# Patient Record
Sex: Female | Born: 1985 | Race: White | Hispanic: No | Smoking: Former smoker
Health system: Southern US, Community
[De-identification: ages and names within clinical notes are randomized; demographics above are authoritative.]

## PROBLEM LIST (undated history)

## (undated) ENCOUNTER — Inpatient Hospital Stay (HOSPITAL_COMMUNITY): Payer: Self-pay

## (undated) DIAGNOSIS — O139 Gestational [pregnancy-induced] hypertension without significant proteinuria, unspecified trimester: Secondary | ICD-10-CM

## (undated) DIAGNOSIS — Q248 Other specified congenital malformations of heart: Secondary | ICD-10-CM

## (undated) DIAGNOSIS — Q349 Congenital malformation of respiratory system, unspecified: Secondary | ICD-10-CM

## (undated) DIAGNOSIS — IMO0002 Reserved for concepts with insufficient information to code with codable children: Secondary | ICD-10-CM

## (undated) DIAGNOSIS — E063 Autoimmune thyroiditis: Secondary | ICD-10-CM

## (undated) DIAGNOSIS — G039 Meningitis, unspecified: Secondary | ICD-10-CM

## (undated) HISTORY — DX: Autoimmune thyroiditis: E06.3

## (undated) HISTORY — PX: WISDOM TOOTH EXTRACTION: SHX21

## (undated) HISTORY — PX: TONSILLECTOMY AND ADENOIDECTOMY: SUR1326

## (undated) HISTORY — DX: Reserved for concepts with insufficient information to code with codable children: IMO0002

## (undated) HISTORY — DX: Congenital malformation of respiratory system, unspecified: Q34.9

## (undated) HISTORY — DX: Meningitis, unspecified: G03.9

---
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
## 2005-01-06 ENCOUNTER — Emergency Department (HOSPITAL_COMMUNITY): Admission: EM | Admit: 2005-01-06 | Discharge: 2005-01-07 | Payer: Self-pay | Admitting: Emergency Medicine

## 2005-05-28 ENCOUNTER — Emergency Department (HOSPITAL_COMMUNITY): Admission: EM | Admit: 2005-05-28 | Discharge: 2005-05-28 | Payer: Self-pay | Admitting: Emergency Medicine

## 2006-02-28 ENCOUNTER — Emergency Department (HOSPITAL_COMMUNITY): Admission: EM | Admit: 2006-02-28 | Discharge: 2006-03-01 | Payer: Self-pay | Admitting: Emergency Medicine

## 2006-09-06 ENCOUNTER — Emergency Department (HOSPITAL_COMMUNITY): Admission: EM | Admit: 2006-09-06 | Discharge: 2006-09-06 | Payer: Self-pay | Admitting: *Deleted

## 2006-11-04 ENCOUNTER — Emergency Department (HOSPITAL_COMMUNITY): Admission: EM | Admit: 2006-11-04 | Discharge: 2006-11-05 | Payer: Self-pay | Admitting: Emergency Medicine

## 2006-11-12 ENCOUNTER — Emergency Department (HOSPITAL_COMMUNITY): Admission: EM | Admit: 2006-11-12 | Discharge: 2006-11-12 | Payer: Self-pay | Admitting: Emergency Medicine

## 2007-05-05 ENCOUNTER — Emergency Department (HOSPITAL_COMMUNITY): Admission: EM | Admit: 2007-05-05 | Discharge: 2007-05-05 | Payer: Self-pay | Admitting: Emergency Medicine

## 2007-07-30 ENCOUNTER — Emergency Department (HOSPITAL_COMMUNITY): Admission: EM | Admit: 2007-07-30 | Discharge: 2007-07-30 | Payer: Self-pay | Admitting: Emergency Medicine

## 2007-11-27 ENCOUNTER — Emergency Department (HOSPITAL_COMMUNITY): Admission: EM | Admit: 2007-11-27 | Discharge: 2007-11-27 | Payer: Self-pay | Admitting: Emergency Medicine

## 2007-12-13 ENCOUNTER — Emergency Department (HOSPITAL_COMMUNITY): Admission: EM | Admit: 2007-12-13 | Discharge: 2007-12-13 | Payer: Self-pay | Admitting: Emergency Medicine

## 2008-01-11 ENCOUNTER — Ambulatory Visit (HOSPITAL_COMMUNITY): Admission: RE | Admit: 2008-01-11 | Discharge: 2008-01-11 | Payer: Self-pay | Admitting: Family Medicine

## 2008-05-22 ENCOUNTER — Emergency Department (HOSPITAL_COMMUNITY): Admission: EM | Admit: 2008-05-22 | Discharge: 2008-05-22 | Payer: Self-pay | Admitting: Emergency Medicine

## 2008-09-28 ENCOUNTER — Emergency Department (HOSPITAL_COMMUNITY): Admission: EM | Admit: 2008-09-28 | Discharge: 2008-09-28 | Payer: Self-pay | Admitting: Emergency Medicine

## 2008-10-04 ENCOUNTER — Emergency Department (HOSPITAL_COMMUNITY): Admission: EM | Admit: 2008-10-04 | Discharge: 2008-10-04 | Payer: Self-pay | Admitting: Emergency Medicine

## 2009-06-13 ENCOUNTER — Inpatient Hospital Stay (HOSPITAL_COMMUNITY): Admission: AD | Admit: 2009-06-13 | Discharge: 2009-06-13 | Payer: Self-pay | Admitting: Obstetrics and Gynecology

## 2009-06-13 ENCOUNTER — Emergency Department (HOSPITAL_COMMUNITY): Admission: EM | Admit: 2009-06-13 | Discharge: 2009-06-13 | Payer: Self-pay | Admitting: Emergency Medicine

## 2009-07-02 DEATH — deceased

## 2010-02-22 ENCOUNTER — Encounter: Payer: Self-pay | Admitting: Emergency Medicine

## 2010-04-09 ENCOUNTER — Emergency Department (HOSPITAL_COMMUNITY)
Admission: EM | Admit: 2010-04-09 | Discharge: 2010-04-09 | Disposition: A | Discharge status: Home / Self Care | Payer: Managed Care, Other (non HMO) | Attending: Emergency Medicine | Admitting: Emergency Medicine

## 2010-04-09 DIAGNOSIS — R059 Cough, unspecified: Secondary | ICD-10-CM | POA: Insufficient documentation

## 2010-04-09 DIAGNOSIS — R05 Cough: Secondary | ICD-10-CM | POA: Insufficient documentation

## 2010-04-09 DIAGNOSIS — J029 Acute pharyngitis, unspecified: Secondary | ICD-10-CM | POA: Insufficient documentation

## 2010-04-09 LAB — RAPID STREP SCREEN (MED CTR MEBANE ONLY): Streptococcus, Group A Screen (Direct): NEGATIVE

## 2010-04-09 LAB — MONONUCLEOSIS SCREEN: Mono Screen: NEGATIVE

## 2010-04-21 LAB — BASIC METABOLIC PANEL
BUN: 5 mg/dL — ABNORMAL LOW (ref 6–23)
CO2: 23 mEq/L (ref 19–32)
Calcium: 9.6 mg/dL (ref 8.4–10.5)
Chloride: 107 mEq/L (ref 96–112)
Creatinine, Ser: 0.54 mg/dL (ref 0.4–1.2)
GFR calc Af Amer: >60 mL/min (ref >60)
GFR calc non Af Amer: >60 mL/min (ref >60)
Glucose, Bld: 99 mg/dL (ref 70–99)
Potassium: 3.6 mEq/L (ref 3.5–5.1)
Sodium: 137 mEq/L (ref 135–145)

## 2010-04-21 LAB — DIFFERENTIAL
Basophils Absolute: 0.1 10*3/uL (ref 0.0–0.1)
Basophils Relative: 1 % (ref 0–1)
Eosinophils Absolute: 0.2 10*3/uL (ref 0.0–0.7)
Eosinophils Relative: 2 % (ref 0–5)
Lymphocytes Relative: 20 % (ref 12–46)
Lymphs Abs: 2.8 10*3/uL (ref 0.7–4.0)
Monocytes Absolute: 1.1 10*3/uL — ABNORMAL HIGH (ref 0.1–1.0)
Monocytes Relative: 8 % (ref 3–12)
Neutro Abs: 9.6 10*3/uL — ABNORMAL HIGH (ref 1.7–7.7)
Neutrophils Relative %: 69 % (ref 43–77)

## 2010-04-21 LAB — CBC
HCT: 32.2 % — ABNORMAL LOW (ref 36.0–46.0)
Hemoglobin: 11.6 g/dL — ABNORMAL LOW (ref 12.0–15.0)
MCHC: 35.9 g/dL (ref 30.0–36.0)
MCV: 88.6 fL (ref 78.0–100.0)
Platelets: 181 10*3/uL (ref 150–400)
RBC: 3.63 MIL/uL — ABNORMAL LOW (ref 3.87–5.11)
RDW: 12.5 % (ref 11.5–15.5)
WBC: 13.9 10*3/uL — ABNORMAL HIGH (ref 4.0–10.5)

## 2010-04-21 LAB — HCG, QUANTITATIVE, PREGNANCY: hCG, Beta Chain, Quant, S: 4533 m[IU]/mL — ABNORMAL HIGH (ref <5)

## 2010-04-21 LAB — ABO/RH: ABO/RH(D): AB POS

## 2010-05-08 LAB — POCT CARDIAC MARKERS
CKMB, poc: <1 ng/mL — ABNORMAL LOW (ref 1.0–8.0)
CKMB, poc: <1 ng/mL — ABNORMAL LOW (ref 1.0–8.0)
Myoglobin, poc: 38.9 ng/mL (ref 12–200)
Myoglobin, poc: 44.6 ng/mL (ref 12–200)
Troponin i, poc: <0.05 ng/mL (ref 0.00–0.09)
Troponin i, poc: <0.05 ng/mL (ref 0.00–0.09)

## 2010-05-08 LAB — BASIC METABOLIC PANEL
BUN: 8 mg/dL (ref 6–23)
CO2: 22 mEq/L (ref 19–32)
Calcium: 8.8 mg/dL (ref 8.4–10.5)
Chloride: 112 mEq/L (ref 96–112)
Creatinine, Ser: 0.66 mg/dL (ref 0.4–1.2)
GFR calc Af Amer: >60 mL/min (ref >60)
GFR calc non Af Amer: >60 mL/min (ref >60)
Glucose, Bld: 104 mg/dL — ABNORMAL HIGH (ref 70–99)
Potassium: 4.1 mEq/L (ref 3.5–5.1)
Sodium: 141 mEq/L (ref 135–145)

## 2010-05-08 LAB — CBC
HCT: 37.6 % (ref 36.0–46.0)
Hemoglobin: 13.2 g/dL (ref 12.0–15.0)
MCHC: 35.1 g/dL (ref 30.0–36.0)
MCV: 90.3 fL (ref 78.0–100.0)
Platelets: 190 10*3/uL (ref 150–400)
RBC: 4.16 MIL/uL (ref 3.87–5.11)
RDW: 13.6 % (ref 11.5–15.5)
WBC: 10.8 10*3/uL — ABNORMAL HIGH (ref 4.0–10.5)

## 2010-05-08 LAB — DIFFERENTIAL
Basophils Absolute: 0.1 10*3/uL (ref 0.0–0.1)
Basophils Relative: 1 % (ref 0–1)
Eosinophils Absolute: 0.2 10*3/uL (ref 0.0–0.7)
Eosinophils Relative: 2 % (ref 0–5)
Lymphocytes Relative: 23 % (ref 12–46)
Lymphs Abs: 2.5 10*3/uL (ref 0.7–4.0)
Monocytes Absolute: 0.8 10*3/uL (ref 0.1–1.0)
Monocytes Relative: 8 % (ref 3–12)
Neutro Abs: 7.1 10*3/uL (ref 1.7–7.7)
Neutrophils Relative %: 66 % (ref 43–77)

## 2010-05-09 LAB — CULTURE, ROUTINE-ABSCESS: Gram Stain: NONE SEEN

## 2010-05-13 LAB — BASIC METABOLIC PANEL WITH GFR
BUN: 10 mg/dL (ref 6–23)
CO2: 27 meq/L (ref 19–32)
Calcium: 9.9 mg/dL (ref 8.4–10.5)
Chloride: 108 meq/L (ref 96–112)
Creatinine, Ser: 0.7 mg/dL (ref 0.4–1.2)
GFR calc non Af Amer: >60 mL/min
Glucose, Bld: 99 mg/dL (ref 70–99)
Potassium: 4.4 meq/L (ref 3.5–5.1)
Sodium: 139 meq/L (ref 135–145)

## 2010-05-13 LAB — POCT CARDIAC MARKERS
CKMB, poc: <1 ng/mL — ABNORMAL LOW (ref 1.0–8.0)
Myoglobin, poc: 50.6 ng/mL (ref 12–200)
Troponin i, poc: <0.05 ng/mL (ref 0.00–0.09)

## 2010-10-27 LAB — CBC
HCT: 35.4 — ABNORMAL LOW
Hemoglobin: 12.4
MCHC: 35.1
MCV: 89
Platelets: 188
RBC: 3.98
RDW: 13.3
WBC: 9.5

## 2010-10-27 LAB — BASIC METABOLIC PANEL
BUN: 11
CO2: 25
Calcium: 9
Chloride: 109
Creatinine, Ser: 0.63
GFR calc Af Amer: >60
GFR calc non Af Amer: >60
Glucose, Bld: 100 — ABNORMAL HIGH
Potassium: 3.9
Sodium: 139

## 2010-10-27 LAB — DIFFERENTIAL
Basophils Absolute: 0.1
Basophils Relative: 1
Eosinophils Absolute: 0.2
Eosinophils Relative: 2
Lymphocytes Relative: 30
Lymphs Abs: 2.8
Monocytes Absolute: 1.2 — ABNORMAL HIGH
Monocytes Relative: 13 — ABNORMAL HIGH
Neutro Abs: 5.2
Neutrophils Relative %: 55

## 2010-10-27 LAB — POCT CARDIAC MARKERS
CKMB, poc: <1 — ABNORMAL LOW
Myoglobin, poc: 25.2
Operator id: 286941
Troponin i, poc: <0.05

## 2010-11-02 LAB — URINALYSIS, ROUTINE W REFLEX MICROSCOPIC
Bilirubin Urine: NEGATIVE
Glucose, UA: NEGATIVE
Hgb urine dipstick: NEGATIVE
Ketones, ur: NEGATIVE
Nitrite: NEGATIVE
Protein, ur: NEGATIVE
Specific Gravity, Urine: 1.02
Urobilinogen, UA: 0.2
pH: 7

## 2010-11-02 LAB — GC/CHLAMYDIA PROBE AMP, GENITAL
Chlamydia, DNA Probe: NEGATIVE
GC Probe Amp, Genital: NEGATIVE

## 2010-11-02 LAB — WET PREP, GENITAL
Clue Cells Wet Prep HPF POC: NONE SEEN
Trich, Wet Prep: NONE SEEN
WBC, Wet Prep HPF POC: NONE SEEN
Yeast Wet Prep HPF POC: NONE SEEN

## 2010-11-02 LAB — RPR: RPR Ser Ql: NONREACTIVE

## 2010-11-02 LAB — PREGNANCY, URINE: Preg Test, Ur: NEGATIVE

## 2010-11-12 LAB — CBC
HCT: 34.8 — ABNORMAL LOW
HCT: 37.9
Hemoglobin: 11.9 — ABNORMAL LOW
Hemoglobin: 12.9
MCHC: 34.1
MCHC: 34.2
MCV: 89.1
MCV: 89.3
Platelets: 196
Platelets: 254
RBC: 3.9
RBC: 4.25
RDW: 13.1
RDW: 13.1
WBC: 10.4
WBC: 12.6 — ABNORMAL HIGH

## 2010-11-12 LAB — URINALYSIS, ROUTINE W REFLEX MICROSCOPIC
Bilirubin Urine: NEGATIVE
Bilirubin Urine: NEGATIVE
Glucose, UA: NEGATIVE
Glucose, UA: NEGATIVE
Ketones, ur: NEGATIVE
Ketones, ur: NEGATIVE
Leukocytes, UA: NEGATIVE
Leukocytes, UA: NEGATIVE
Nitrite: NEGATIVE
Nitrite: NEGATIVE
Protein, ur: NEGATIVE
Protein, ur: NEGATIVE
Specific Gravity, Urine: 1.025
Specific Gravity, Urine: <1.005 — ABNORMAL LOW
Urobilinogen, UA: 0.2
Urobilinogen, UA: 0.2
pH: 6.5
pH: 7

## 2010-11-12 LAB — BASIC METABOLIC PANEL
BUN: 6
CO2: 25
Calcium: 9.3
Chloride: 106
Creatinine, Ser: 0.62
GFR calc Af Amer: >60
GFR calc non Af Amer: >60
Glucose, Bld: 93
Potassium: 4.1
Sodium: 136

## 2010-11-12 LAB — WET PREP, GENITAL
Trich, Wet Prep: NONE SEEN
Yeast Wet Prep HPF POC: NONE SEEN

## 2010-11-12 LAB — DIFFERENTIAL
Basophils Absolute: 0
Basophils Absolute: 0.1
Basophils Relative: 0
Basophils Relative: 1
Eosinophils Absolute: 0.2
Eosinophils Absolute: 0.2
Eosinophils Relative: 2
Eosinophils Relative: 2
Lymphocytes Relative: 25
Lymphocytes Relative: 28
Lymphs Abs: 2.9
Lymphs Abs: 3.1
Monocytes Absolute: 1 — ABNORMAL HIGH
Monocytes Absolute: 1.1 — ABNORMAL HIGH
Monocytes Relative: 10
Monocytes Relative: 9
Neutro Abs: 6.2
Neutro Abs: 8.1 — ABNORMAL HIGH
Neutrophils Relative %: 60
Neutrophils Relative %: 65

## 2010-11-12 LAB — RPR: RPR Ser Ql: NONREACTIVE

## 2010-11-12 LAB — URINE MICROSCOPIC-ADD ON

## 2010-11-12 LAB — GC/CHLAMYDIA PROBE AMP, GENITAL
Chlamydia, DNA Probe: NEGATIVE
GC Probe Amp, Genital: NEGATIVE

## 2010-11-12 LAB — PREGNANCY, URINE
Preg Test, Ur: NEGATIVE
Preg Test, Ur: NEGATIVE

## 2011-06-24 LAB — OB RESULTS CONSOLE HEPATITIS B SURFACE ANTIGEN: Hepatitis B Surface Ag: NEGATIVE

## 2011-06-24 LAB — OB RESULTS CONSOLE RUBELLA ANTIBODY, IGM: Rubella: IMMUNE

## 2011-06-24 LAB — OB RESULTS CONSOLE HIV ANTIBODY (ROUTINE TESTING): HIV: NONREACTIVE

## 2011-06-24 LAB — OB RESULTS CONSOLE RPR: RPR: NONREACTIVE

## 2011-06-24 LAB — OB RESULTS CONSOLE ABO/RH: RH Type: POSITIVE

## 2011-06-24 LAB — OB RESULTS CONSOLE ANTIBODY SCREEN: Antibody Screen: NEGATIVE

## 2011-07-15 ENCOUNTER — Other Ambulatory Visit: Payer: Self-pay | Admitting: Adult Health

## 2011-07-15 ENCOUNTER — Other Ambulatory Visit (HOSPITAL_COMMUNITY)
Admission: RE | Admit: 2011-07-15 | Discharge: 2011-07-15 | Disposition: A | Discharge status: Home / Self Care | Payer: Managed Care, Other (non HMO) | Source: Ambulatory Visit | Attending: Obstetrics and Gynecology | Admitting: Obstetrics and Gynecology

## 2011-07-15 DIAGNOSIS — Z113 Encounter for screening for infections with a predominantly sexual mode of transmission: Secondary | ICD-10-CM | POA: Insufficient documentation

## 2011-07-15 DIAGNOSIS — Z01419 Encounter for gynecological examination (general) (routine) without abnormal findings: Secondary | ICD-10-CM | POA: Insufficient documentation

## 2012-01-24 LAB — OB RESULTS CONSOLE GC/CHLAMYDIA
Chlamydia: NEGATIVE
Gonorrhea: NEGATIVE

## 2012-01-24 LAB — OB RESULTS CONSOLE GBS: GBS: NEGATIVE

## 2012-02-02 ENCOUNTER — Inpatient Hospital Stay (HOSPITAL_COMMUNITY)
Admission: AD | Admit: 2012-02-02 | Discharge: 2012-02-02 | Disposition: A | Discharge status: Home / Self Care | Payer: Managed Care, Other (non HMO) | Source: Ambulatory Visit | Attending: Obstetrics & Gynecology | Admitting: Obstetrics & Gynecology

## 2012-02-02 ENCOUNTER — Encounter (HOSPITAL_COMMUNITY): Payer: Self-pay | Admitting: *Deleted

## 2012-02-02 DIAGNOSIS — R51 Headache: Secondary | ICD-10-CM | POA: Insufficient documentation

## 2012-02-02 DIAGNOSIS — O133 Gestational [pregnancy-induced] hypertension without significant proteinuria, third trimester: Secondary | ICD-10-CM

## 2012-02-02 DIAGNOSIS — O139 Gestational [pregnancy-induced] hypertension without significant proteinuria, unspecified trimester: Secondary | ICD-10-CM

## 2012-02-02 HISTORY — DX: Gestational (pregnancy-induced) hypertension without significant proteinuria, unspecified trimester: O13.9

## 2012-02-02 LAB — URINE MICROSCOPIC-ADD ON

## 2012-02-02 LAB — URINALYSIS, ROUTINE W REFLEX MICROSCOPIC
Bilirubin Urine: NEGATIVE
Glucose, UA: NEGATIVE mg/dL
Hgb urine dipstick: NEGATIVE
Ketones, ur: NEGATIVE mg/dL
Nitrite: NEGATIVE
Protein, ur: NEGATIVE mg/dL
Specific Gravity, Urine: 1.01 (ref 1.005–1.030)
Urobilinogen, UA: 0.2 mg/dL (ref 0.0–1.0)
pH: 6.5 (ref 5.0–8.0)

## 2012-02-02 LAB — PROTEIN / CREATININE RATIO, URINE
Creatinine, Urine: 51.44 mg/dL
Protein Creatinine Ratio: 0.17 — ABNORMAL HIGH (ref 0.00–0.15)
Total Protein, Urine: 8.9 mg/dL

## 2012-02-02 MED ORDER — ACETAMINOPHEN 500 MG PO TABS
1000.0000 mg | ORAL_TABLET | Freq: Once | ORAL | Status: AC
  Administered 2012-02-02: 1000 mg via ORAL
  Filled 2012-02-02: qty 2

## 2012-02-02 MED ORDER — CALCIUM CARBONATE ANTACID 500 MG PO CHEW
400.0000 mg | CHEWABLE_TABLET | Freq: Once | ORAL | Status: AC
  Administered 2012-02-02: 400 mg via ORAL
  Filled 2012-02-02: qty 2

## 2012-02-02 NOTE — L&D Delivery Note (Signed)
Delivery Note  Ms. Joanna [name] is a 27 year old G3P0020 at 40.[redacted] weeks gestation by LMP presented to the labor & delivery unit on 02/13/12 for IOL for GHTN.  Cytotec was used for induction.  She progressed well with Cytotec only.  Her pain has been controlled with an epidural.  AROM at 0150 with light meconium amniotic fluid.  She became complete at 0926 and pushed for 1.5 hours with minimal descent.  She was allowed to rest for 1 hour and resumed pushing with stronger efforts and better descent.    At 12:38 PM a viable female was delivered via Vaginal, Spontaneous Delivery (Presentation: ; Occiput Anterior).  APGAR: 9, 9; weight: pending.   Placenta status: Intact, Spontaneous via Joanna [name].  Cord: 3 vessels with the following complications: None.    Anesthesia: Epidural  Episiotomy: None Lacerations: shallow 2nd degree  Suture Repair: 3.0 vicryl Est. Blood Loss (mL): 200  Mom to postpartum.  Baby skin-to-skin with mom then to nursery prn.  Joanna [name], SNM 02/14/2012, 1:07 PM Supervised by: Wynelle Bourgeois, CNM

## 2012-02-02 NOTE — MAU Note (Signed)
Pt states she was seen in the clinic 02/01/12 and blood pressure was elevated

## 2012-02-02 NOTE — MAU Provider Note (Signed)
Chief Complaint:  Headache and Hypertension   First Provider Initiated Contact with Patient 02/02/12 1550      HPI: Joanna [name] is a 27 y.o. G3P0020 at [redacted]w[redacted]d presents to maternity admissions reporting BP 160'90's at work, frontal HA 3/10, mild relief w/ 325 mg Tylenol, one episode of dizziness at work today, resolved spontaneously. States her BP was elevated and she had protein in her urine yesterday at [facility]. PIH labs draw. Doesn't know results. Denies contractions, leakage of fluid or vaginal bleeding. Good fetal movement.   Pregnancy Course:   Past Medical History: Past Medical History  Diagnosis Date  . Pregnancy induced hypertension     Past obstetric history: OB History    Grav Para Term Preterm Abortions TAB SAB Ect Mult Living   3 0 0 0 2 0 2 0 0 0      # Outc Date GA Lbr Len/2nd Wgt Sex Del Anes PTL Lv   1 SAB 1/08           2 SAB 1/11           3 CUR               Past Surgical History: Past Surgical History  Procedure Date  . Tonsillectomy   . Adnoidectomy     Family History: Family History  Problem Relation Age of Onset  . Hepatitis Mother     Social History: History  Substance Use Topics  . Smoking status: Current Every Day Smoker  . Smokeless tobacco: Not on file  . Alcohol Use: No    Allergies: No Known Allergies  Meds:  Prescriptions prior to admission  Medication Sig Dispense Refill  . acetaminophen (TYLENOL) 325 MG tablet 325 mg this am.      . Prenatal Vit-Fe Fumarate-FA (PRENATAL MULTIVITAMIN) TABS         ROS: Pertinent findings in history of present illness.  Physical Exam  Blood pressure 117/76, pulse 86, temperature 98.8 F (37.1 C), temperature source Oral, resp. rate 18, last menstrual period 05/09/2011. Patient Vitals for the past 24 hrs:  BP Temp Temp src Pulse Resp  02/02/12 1646 117/76 mmHg - - 86  -  02/02/12 1631 127/81 mmHg - - 86  -  02/02/12 1616 117/70 mmHg - - 85  -  02/02/12 1601 129/76 mmHg - - 89  -    02/02/12 1546 132/87 mmHg - - 92  -  02/02/12 1531 138/79 mmHg - - 87  -  02/02/12 1517 134/84 mmHg - - 94  -  02/02/12 1516 131/85 mmHg 98.8 F (37.1 C) Oral 98  18     GENERAL: Well-developed, well-nourished female in no acute distress.  HEENT: normocephalic HEART: normal rate RESP: normal effort ABDOMEN: Soft, non-tender, gravid appropriate for gestational age EXTREMITIES: Nontender, 2+ edema, DTRs 2+, neg homan's NEURO: alert and oriented SPECULUM EXAM: deferred   FHT:  Baseline 130 , moderate variability, accelerations present, no decelerations Contractions: rare, mild   Labs: Results for orders placed during the hospital encounter of 02/02/12 (from the past 24 hour(s))  URINALYSIS, ROUTINE W REFLEX MICROSCOPIC     Status: Abnormal   Collection Time   02/02/12  2:59 PM      Component Value Range   Color, Urine YELLOW  YELLOW   APPearance HAZY (*) CLEAR   Specific Gravity, Urine 1.010  1.005 - 1.030   pH 6.5  5.0 - 8.0   Glucose, UA NEGATIVE  NEGATIVE mg/dL  Hgb urine dipstick NEGATIVE  NEGATIVE   Bilirubin Urine NEGATIVE  NEGATIVE   Ketones, ur NEGATIVE  NEGATIVE mg/dL   Protein, ur NEGATIVE  NEGATIVE mg/dL   Urobilinogen, UA 0.2  0.0 - 1.0 mg/dL   Nitrite NEGATIVE  NEGATIVE   Leukocytes, UA SMALL (*) NEGATIVE  URINE MICROSCOPIC-ADD ON     Status: Abnormal   Collection Time   02/02/12  2:59 PM      Component Value Range   Squamous Epithelial / LPF MANY (*) RARE   WBC, UA 3-6  <3 WBC/hpf   RBC / HPF 0-2  <3 RBC/hpf  PROTEIN / CREATININE RATIO, URINE     Status: Abnormal   Collection Time   02/02/12  2:59 PM      Component Value Range   Creatinine, Urine 51.44     Total Protein, Urine 8.9     PROTEIN CREATININE RATIO 0.17 (*) 0.00 - 0.15    Imaging:  No results found. MAU Course: 1615: ES Tylenol and Tums ordered. Will attempt to obtain [facility] labs results from yesterday. Protein Creatinine ratio ordered.  1800: Labs from yesterday reviewed w/ Dr. Despina Hidden. Normal  except ALT slightly elevated at 45. HA resolved.  Assessment: 1. Gestational hypertension w/o significant proteinuria in 3rd trimester     Plan: Discharge home per consult w/ Dr. Despina Hidden. Labor and PIH precautions and fetal kick counts     Follow-up Information    Follow up with [facility]. On 02/04/2012.   Contact information:   [address] [place] [address] [phone]      Follow up with [facility]ERN[facility] ADMISS[facility]. (As needed if symptoms worsen)    Contact information:   [address] [id number] mc [place] [address] [phone]          Medication List     As of 02/02/2012  9:12 PM    TAKE these medications         acetaminophen 325 MG tablet   Commonly known as: TYLENOL   Take 650 mg by mouth every 6 (six) hours as needed. headache      prenatal multivitamin Tabs   Take 1 tablet by mouth daily.         [place], CNM 02/02/2012 4:07 PM

## 2012-02-02 NOTE — L&D Delivery Note (Signed)
Attended delivery Agree with note Mitchell Epling CNM 

## 2012-02-02 NOTE — Progress Notes (Signed)
Pt state she became dizzy earlier today

## 2012-02-11 ENCOUNTER — Telehealth (HOSPITAL_COMMUNITY): Payer: Self-pay | Admitting: *Deleted

## 2012-02-11 ENCOUNTER — Encounter (HOSPITAL_COMMUNITY): Payer: Self-pay | Admitting: *Deleted

## 2012-02-11 NOTE — H&P (Addendum)
Subjective:  IOL for Gest HTN at 40 .0 weeks  Joanna [name] is a 27 y.o. G3 P0 female with [facility] 02/13/12 at 40 and 0/[redacted] weeks gestation who is being admitted for induction of labor, after being followed at [facility] for Gest HTN for the last 2 weeks of pregnancy with BP's as high as 142/90 with normal Pr/Cr ratio 0.16 on 12/31, and 140/98 with same Pr/Cr on 1/7, with LFT notable 12/31 for AST normal at 27, ALT slight elevation at 45. Recheck 1/7 showed AST 21, and ALT normal at 27.  Office visit 02/11/12 is notable for low normal AF volume, 6.4, BPP 8/8, and Interestingly, the Baby's Gallbladder noted to have what appears to be GB sludge in it. Will mention on PITT form. .  Her current obstetrical history is significant for gest htn.  Patient reports fatigue, no bleeding, no leaking and occasional contractions.   Fetal Movement: normal.    Prenatal Transfer Tool  Maternal Diabetes: No Genetic Screening: Normal Maternal Ultrasounds/Referrals: Abnormal:  Findings:   Other: GALLBLADDER SLUDGE NOTED ON U/S DONE 02/11/12, ALSO NOTABLE FOR EFW 7LB10 OZ, 53%ILE, AND AFI 6.4 CM Fetal Ultrasounds or other Referrals:  None Maternal Substance Abuse:  No Significant Maternal Medications:  None Significant Maternal Lab Results: Lab values include: Group B Strep negative     Objective:   Vital signs in last 24 hours:  BP 02/11/12  138/74 Protein neg on u/a Reflexes 1+  hEIGHT  62.5 IN, WT 212.6 LB  General:   alert, cooperative, no distress and moderately obese  Skin:   normal  HEENT:  PERRLA and extra ocular movement intact  Lungs:   clear to auscultation bilaterally  Heart:   S1, S2 normal  Breasts:     Abdomen:  gravid uterus 37 cm  EFW 7lb 10 oz 1/10  Pelvis:  Cervix: 1/50%/-2 posterior vertex well applied  FHT:  142 BPM  Uterine Size: 37 cm  Presentations: cephalic  Cervix:    Dilation: 1cm   Effacement: 50%   Station:  -2   Consistency: medium   Position: posterior   Lab  Review  AB pos  AFP:NML  One hour GTT: Normal 85/122 on 2hr gtt   Assessment/Plan:  40 and 0/[redacted] weeks gestation for IOL for gest HTN Not in labor. Obstetrical history significant for gest htn..     Risks, benefits, alternatives and possible complications have been discussed in detail with the patient.  Pre-admission, admission, and post admission procedures and expectations were discussed in detail.  All questions answered, all appropriate consents will be signed at the Hospital. Admission is planned for 02/13/12.

## 2012-02-11 NOTE — Telephone Encounter (Signed)
Preadmission screen  

## 2012-02-13 ENCOUNTER — Inpatient Hospital Stay (HOSPITAL_COMMUNITY): Payer: Managed Care, Other (non HMO) | Admitting: Anesthesiology

## 2012-02-13 ENCOUNTER — Inpatient Hospital Stay (HOSPITAL_COMMUNITY)
Admission: RE | Admit: 2012-02-13 | Discharge: 2012-02-16 | DRG: 775 | Disposition: A | Discharge status: Home / Self Care | Payer: Managed Care, Other (non HMO) | Source: Ambulatory Visit | Attending: Obstetrics & Gynecology | Admitting: Obstetrics & Gynecology

## 2012-02-13 ENCOUNTER — Encounter (HOSPITAL_COMMUNITY): Payer: Self-pay | Admitting: Anesthesiology

## 2012-02-13 VITALS — BP 112/76 | HR 71 | Temp 97.6°F | Resp 18 | Ht 62.0 in | Wt 211.0 lb

## 2012-02-13 DIAGNOSIS — O133 Gestational [pregnancy-induced] hypertension without significant proteinuria, third trimester: Secondary | ICD-10-CM

## 2012-02-13 DIAGNOSIS — O139 Gestational [pregnancy-induced] hypertension without significant proteinuria, unspecified trimester: Principal | ICD-10-CM | POA: Diagnosis present

## 2012-02-13 LAB — CBC
HCT: 33.8 % — ABNORMAL LOW (ref 36.0–46.0)
Hemoglobin: 11.8 g/dL — ABNORMAL LOW (ref 12.0–15.0)
MCH: 30.2 pg (ref 26.0–34.0)
MCHC: 34.9 g/dL (ref 30.0–36.0)
MCV: 86.4 fL (ref 78.0–100.0)
Platelets: 201 10*3/uL (ref 150–400)
RBC: 3.91 MIL/uL (ref 3.87–5.11)
RDW: 13.4 % (ref 11.5–15.5)
WBC: 13.4 10*3/uL — ABNORMAL HIGH (ref 4.0–10.5)

## 2012-02-13 LAB — COMPREHENSIVE METABOLIC PANEL
ALT: 31 U/L (ref 0–35)
AST: 20 U/L (ref 0–37)
Albumin: 2.6 g/dL — ABNORMAL LOW (ref 3.5–5.2)
Alkaline Phosphatase: 177 U/L — ABNORMAL HIGH (ref 39–117)
BUN: 7 mg/dL (ref 6–23)
CO2: 19 mEq/L (ref 19–32)
Calcium: 9 mg/dL (ref 8.4–10.5)
Chloride: 105 mEq/L (ref 96–112)
Creatinine, Ser: 0.65 mg/dL (ref 0.50–1.10)
GFR calc Af Amer: >90 mL/min (ref >90)
GFR calc non Af Amer: >90 mL/min (ref >90)
Glucose, Bld: 100 mg/dL — ABNORMAL HIGH (ref 70–99)
Potassium: 4.3 mEq/L (ref 3.5–5.1)
Sodium: 135 mEq/L (ref 135–145)
Total Bilirubin: 0.2 mg/dL — ABNORMAL LOW (ref 0.3–1.2)
Total Protein: 6.5 g/dL (ref 6.0–8.3)

## 2012-02-13 LAB — PROTEIN / CREATININE RATIO, URINE
Creatinine, Urine: 114.1 mg/dL
Protein Creatinine Ratio: 0.18 — ABNORMAL HIGH (ref 0.00–0.15)
Total Protein, Urine: 20.1 mg/dL

## 2012-02-13 LAB — TYPE AND SCREEN
ABO/RH(D): AB POS
Antibody Screen: NEGATIVE

## 2012-02-13 LAB — RPR: RPR Ser Ql: NONREACTIVE

## 2012-02-13 MED ORDER — OXYCODONE-ACETAMINOPHEN 5-325 MG PO TABS: 1.0000 | ORAL_TABLET | ORAL | Status: DC | PRN

## 2012-02-13 MED ORDER — MISOPROSTOL 25 MCG QUARTER TABLET
25.0000 ug | ORAL_TABLET | ORAL | Status: DC | PRN
  Administered 2012-02-13 (×2): 25 ug via VAGINAL
  Filled 2012-02-13 (×2): qty 0.25

## 2012-02-13 MED ORDER — FENTANYL 2.5 MCG/ML BUPIVACAINE 1/10 % EPIDURAL INFUSION (WH - ANES)
14.0000 mL/h | INTRAMUSCULAR | Status: DC
  Administered 2012-02-14: 14 mL/h via EPIDURAL
  Filled 2012-02-13 (×2): qty 125

## 2012-02-13 MED ORDER — PHENYLEPHRINE 40 MCG/ML (10ML) SYRINGE FOR IV PUSH (FOR BLOOD PRESSURE SUPPORT)
80.0000 ug | PREFILLED_SYRINGE | INTRAVENOUS | Status: DC | PRN
  Filled 2012-02-13: qty 5

## 2012-02-13 MED ORDER — FENTANYL CITRATE 0.05 MG/ML IJ SOLN: 100.0000 ug | INTRAMUSCULAR | Status: DC | PRN

## 2012-02-13 MED ORDER — OXYTOCIN BOLUS FROM INFUSION
500.0000 mL | INTRAVENOUS | Status: DC
  Administered 2012-02-14: 500 mL via INTRAVENOUS

## 2012-02-13 MED ORDER — FLEET ENEMA 7-19 GM/118ML RE ENEM: 1.0000 | ENEMA | RECTAL | Status: DC | PRN

## 2012-02-13 MED ORDER — IBUPROFEN 600 MG PO TABS: 600.0000 mg | ORAL_TABLET | Freq: Four times a day (QID) | ORAL | Status: DC | PRN

## 2012-02-13 MED ORDER — DIPHENHYDRAMINE HCL 50 MG/ML IJ SOLN: 12.5000 mg | INTRAMUSCULAR | Status: DC | PRN

## 2012-02-13 MED ORDER — ONDANSETRON HCL 4 MG/2ML IJ SOLN
4.0000 mg | Freq: Four times a day (QID) | INTRAMUSCULAR | Status: DC | PRN
  Administered 2012-02-14 (×2): 4 mg via INTRAVENOUS
  Filled 2012-02-13 (×2): qty 2

## 2012-02-13 MED ORDER — OXYTOCIN 40 UNITS IN LACTATED RINGERS INFUSION - SIMPLE MED
62.5000 mL/h | INTRAVENOUS | Status: DC
  Administered 2012-02-14: 62.5 mL/h via INTRAVENOUS
  Filled 2012-02-13: qty 1000

## 2012-02-13 MED ORDER — LACTATED RINGERS IV SOLN
INTRAVENOUS | Status: DC
  Administered 2012-02-14: 11:00:00 via INTRAVENOUS

## 2012-02-13 MED ORDER — TERBUTALINE SULFATE 1 MG/ML IJ SOLN: 0.2500 mg | Freq: Once | INTRAMUSCULAR | Status: AC | PRN

## 2012-02-13 MED ORDER — LACTATED RINGERS IV SOLN: 500.0000 mL | Freq: Once | INTRAVENOUS | Status: DC

## 2012-02-13 MED ORDER — EPHEDRINE 5 MG/ML INJ: 10.0000 mg | INTRAVENOUS | Status: DC | PRN

## 2012-02-13 MED ORDER — LIDOCAINE HCL (PF) 1 % IJ SOLN
INTRAMUSCULAR | Status: DC | PRN
  Administered 2012-02-13 (×2): 4 mL

## 2012-02-13 MED ORDER — LACTATED RINGERS IV SOLN: 500.0000 mL | INTRAVENOUS | Status: DC | PRN

## 2012-02-13 MED ORDER — ACETAMINOPHEN 325 MG PO TABS: 650.0000 mg | ORAL_TABLET | ORAL | Status: DC | PRN

## 2012-02-13 MED ORDER — FENTANYL CITRATE 0.05 MG/ML IJ SOLN
100.0000 ug | INTRAMUSCULAR | Status: DC | PRN
  Administered 2012-02-13 (×2): 100 ug via INTRAVENOUS
  Filled 2012-02-13 (×2): qty 2

## 2012-02-13 MED ORDER — CITRIC ACID-SODIUM CITRATE 334-500 MG/5ML PO SOLN: 30.0000 mL | ORAL | Status: DC | PRN

## 2012-02-13 MED ORDER — FENTANYL 2.5 MCG/ML BUPIVACAINE 1/10 % EPIDURAL INFUSION (WH - ANES)
INTRAMUSCULAR | Status: DC | PRN
  Administered 2012-02-13: 12 mL/h via EPIDURAL

## 2012-02-13 MED ORDER — PHENYLEPHRINE 40 MCG/ML (10ML) SYRINGE FOR IV PUSH (FOR BLOOD PRESSURE SUPPORT): 80.0000 ug | PREFILLED_SYRINGE | INTRAVENOUS | Status: DC | PRN

## 2012-02-13 MED ORDER — LIDOCAINE HCL (PF) 1 % IJ SOLN
30.0000 mL | INTRAMUSCULAR | Status: DC | PRN
  Filled 2012-02-13: qty 30

## 2012-02-13 MED ORDER — EPHEDRINE 5 MG/ML INJ
10.0000 mg | INTRAVENOUS | Status: DC | PRN
  Filled 2012-02-13: qty 4

## 2012-02-13 NOTE — Progress Notes (Addendum)
Patient ID: [name], female   DOB: [birth year], 27 y.o.   MRN: [id number]  S:  Pt here for induction of labor. Feeling ctx q 10 minutes. No leak of fluid or bleeding. Baby moving.  O: Filed Vitals:   02/13/12 0754 02/13/12 0800  BP: 135/99   Pulse: 101   Resp: 20   Height:  5\' 2"  (1.575 m)  Weight:  95.709 kg (211 lb)   Cervix:  2/60/-2 very posterior  FHTs:  150, mod var, accels present, no decels Vertex by ultrasound  A/P 27 y.o. G3P0020 at [redacted]w[redacted]d with GHTN - IOL with cytotec (bishop's score 5), then pitocin - GBS negative - BP are 130s/high 90s. Will get labs and protein/creat ratio (was 0.17 on 1/1). - Epidural when desired  Napoleon Form, MD 02/13/2012 9:32 AM

## 2012-02-13 NOTE — Anesthesia Procedure Notes (Signed)
Epidural Patient location during procedure: OB Start time: 02/13/2012 9:42 PM  Staffing Anesthesiologist: Tumeka Chimenti A. Performed by: anesthesiologist   Preanesthetic Checklist Completed: patient identified, site marked, surgical consent, pre-op evaluation, timeout performed, IV checked, risks and benefits discussed and monitors and equipment checked  Epidural Patient position: sitting Prep: site prepped and draped and DuraPrep Patient monitoring: continuous pulse ox and blood pressure Approach: midline Injection technique: LOR air  Needle:  Needle type: Tuohy  Needle gauge: 17 G Needle length: 9 cm and 9 Needle insertion depth: 5 cm cm Catheter type: closed end flexible Catheter size: 19 Gauge Catheter at skin depth: 10 cm Test dose: negative and Other  Assessment Events: blood not aspirated, injection not painful, no injection resistance, negative IV test and no paresthesia  Additional Notes Patient identified. Risks and benefits discussed including failed block, incomplete  Pain control, post dural puncture headache, nerve damage, paralysis, blood pressure Changes, nausea, vomiting, reactions to medications-both toxic and allergic and post Partum back pain. All questions were answered. Patient expressed understanding and wished to proceed. Sterile technique was used throughout procedure. Epidural site was Dressed with sterile barrier dressing. No paresthesias, signs of intravascular injection Or signs of intrathecal spread were encountered.  Patient was more comfortable after the epidural was dosed. Please see RN's note for documentation of vital signs and FHR which are stable.

## 2012-02-13 NOTE — Progress Notes (Signed)
Patient ID: Joanna [name], female   DOB: 03/15/1985, 27 y.o.   MRN: [id number]   S:  Pt mildly uncomfortable. Some ctx she is having to breathe through.  O:   Filed Vitals:   02/13/12 0800 02/13/12 0956 02/13/12 1111 02/13/12 1320  BP:  138/61 136/85 135/86  Pulse:  88 84 82  Temp:    98.2 F (36.8 C)  TempSrc:    Oral  Resp:  18 18 18   Height: 5\' 2"  (1.575 m)     Weight: 95.709 kg (211 lb)       Results for orders placed during the hospital encounter of 02/13/12 (from the past 24 hour(s))  CBC     Status: Abnormal   Collection Time   02/13/12  9:44 AM      Component Value Range   WBC 13.4 (*) 4.0 - 10.5 K/uL   RBC 3.91  3.87 - 5.11 MIL/uL   Hemoglobin 11.8 (*) 12.0 - 15.0 g/dL   HCT 40.9 (*) 81.1 - 91.4 %   MCV 86.4  78.0 - 100.0 fL   MCH 30.2  26.0 - 34.0 pg   MCHC 34.9  30.0 - 36.0 g/dL   RDW 78.2  95.6 - 21.3 %   Platelets 201  150 - 400 K/uL  COMPREHENSIVE METABOLIC PANEL     Status: Abnormal   Collection Time   02/13/12  9:44 AM      Component Value Range   Sodium 135  135 - 145 mEq/L   Potassium 4.3  3.5 - 5.1 mEq/L   Chloride 105  96 - 112 mEq/L   CO2 19  19 - 32 mEq/L   Glucose, Bld 100 (*) 70 - 99 mg/dL   BUN 7  6 - 23 mg/dL   Creatinine, Ser 0.86  0.50 - 1.10 mg/dL   Calcium 9.0  8.4 - 57.8 mg/dL   Total Protein 6.5  6.0 - 8.3 g/dL   Albumin 2.6 (*) 3.5 - 5.2 g/dL   AST 20  0 - 37 U/L   ALT 31  0 - 35 U/L   Alkaline Phosphatase 177 (*) 39 - 117 U/L   Total Bilirubin 0.2 (*) 0.3 - 1.2 mg/dL   GFR calc non Af Amer >90  >90 mL/min   GFR calc Af Amer >90  >90 mL/min  TYPE AND SCREEN     Status: Normal   Collection Time   02/13/12  9:44 AM      Component Value Range   ABO/RH(D) AB POS     Antibody Screen NEG     Sample Expiration 02/16/2012      CERVIX:  2/60/-2, very posterior.  FHTs: 130, mod var, accels present, no decels TOCO:  q 1-5 min  A/P IOL for GHTN. - BP controlled - Urine protein:creatinine pending, CMP and CBC nml - Redose  cytotec, too posterior for FB.  Joaquim Lai 02/13/2012 2:21 PM

## 2012-02-13 NOTE — Progress Notes (Signed)
Subjective: Pt doing well, starting to have more ctx.  No leakage of fluid to this point  Objective: BP 144/86  Pulse 82  Temp 98.1 F (36.7 C) (Oral)  Resp 20  Ht 5\' 2"  (1.575 m)  Wt 95.709 kg (211 lb)  BMI 38.59 kg/m2  LMP 05/09/2011      FHT:  FHR: 140 bpm, variability: moderate,  accelerations:  Present,  decelerations:  Absent UC:   regular, every 1-3 minutes SVE:   Dilation: 2.5 Effacement (%): 80 Station: -1 Exam by:: heather cnm  Labs: Lab Results  Component Value Date   WBC 13.4* 02/13/2012   HGB 11.8* 02/13/2012   HCT 33.8* 02/13/2012   MCV 86.4 02/13/2012   PLT 201 02/13/2012    Assessment / Plan: Induction of labor due to gestational hypertension,  progressing well on pitocin  Labor: Progressing normally Preeclampsia:  Protein/Creatinine ratio pending Fetal Wellbeing:  Category I Pain Control:  Epidural I/D:  n/a Anticipated MOD:  NSVD  Joanna [name] 02/13/2012, 8:47 PM

## 2012-02-13 NOTE — Anesthesia Preprocedure Evaluation (Signed)
Anesthesia Evaluation  Patient identified by MRN, date of birth, ID band Patient awake    Reviewed: Allergy & Precautions, H&P , Patient's Chart, lab work & pertinent test results  Airway Mallampati: III TM Distance: >3 FB Neck ROM: full    Dental No notable dental hx. (+) Teeth Intact   Pulmonary neg pulmonary ROS,  breath sounds clear to auscultation  Pulmonary exam normal       Cardiovascular hypertension, Rhythm:regular Rate:Normal  PIH    Neuro/Psych negative neurological ROS  negative psych ROS   GI/Hepatic negative GI ROS, Neg liver ROS,   Endo/Other  negative endocrine ROS  Renal/GU negative Renal ROS  negative genitourinary   Musculoskeletal   Abdominal Normal abdominal exam  (+)   Peds  Hematology negative hematology ROS (+)   Anesthesia Other Findings   Reproductive/Obstetrics (+) Pregnancy                           Anesthesia Physical Anesthesia Plan  ASA: III  Anesthesia Plan: Epidural   Post-op Pain Management:    Induction:   Airway Management Planned:   Additional Equipment:   Intra-op Plan:   Post-operative Plan:   Informed Consent: I have reviewed the patients History and Physical, chart, labs and discussed the procedure including the risks, benefits and alternatives for the proposed anesthesia with the patient or authorized representative who has indicated his/her understanding and acceptance.     Plan Discussed with: Anesthesiologist  Anesthesia Plan Comments:         Anesthesia Quick Evaluation

## 2012-02-14 ENCOUNTER — Encounter (HOSPITAL_COMMUNITY): Payer: Self-pay

## 2012-02-14 DIAGNOSIS — O139 Gestational [pregnancy-induced] hypertension without significant proteinuria, unspecified trimester: Secondary | ICD-10-CM

## 2012-02-14 MED ORDER — ZOLPIDEM TARTRATE 5 MG PO TABS: 5.0000 mg | ORAL_TABLET | Freq: Every evening | ORAL | Status: DC | PRN

## 2012-02-14 MED ORDER — CALCIUM CARBONATE ANTACID 500 MG PO CHEW
2.0000 | CHEWABLE_TABLET | ORAL | Status: DC | PRN
  Administered 2012-02-14 (×2): 400 mg via ORAL
  Filled 2012-02-14 (×2): qty 2

## 2012-02-14 MED ORDER — ONDANSETRON HCL 4 MG/2ML IJ SOLN: 4.0000 mg | INTRAMUSCULAR | Status: DC | PRN

## 2012-02-14 MED ORDER — TETANUS-DIPHTH-ACELL PERTUSSIS 5-2.5-18.5 LF-MCG/0.5 IM SUSP
0.5000 mL | Freq: Once | INTRAMUSCULAR | Status: AC
  Administered 2012-02-15: 0.5 mL via INTRAMUSCULAR
  Filled 2012-02-14: qty 0.5

## 2012-02-14 MED ORDER — ACETAMINOPHEN 325 MG PO TABS: 650.0000 mg | ORAL_TABLET | Freq: Four times a day (QID) | ORAL | Status: DC | PRN

## 2012-02-14 MED ORDER — PRENATAL MULTIVITAMIN CH: 1.0000 | ORAL_TABLET | Freq: Every day | ORAL | Status: DC

## 2012-02-14 MED ORDER — DIBUCAINE 1 % RE OINT: 1.0000 "application " | TOPICAL_OINTMENT | RECTAL | Status: DC | PRN

## 2012-02-14 MED ORDER — WITCH HAZEL-GLYCERIN EX PADS: 1.0000 "application " | MEDICATED_PAD | CUTANEOUS | Status: DC | PRN

## 2012-02-14 MED ORDER — IBUPROFEN 600 MG PO TABS
600.0000 mg | ORAL_TABLET | Freq: Four times a day (QID) | ORAL | Status: DC
  Administered 2012-02-14 – 2012-02-16 (×8): 600 mg via ORAL
  Filled 2012-02-14 (×8): qty 1

## 2012-02-14 MED ORDER — SIMETHICONE 80 MG PO CHEW: 80.0000 mg | CHEWABLE_TABLET | ORAL | Status: DC | PRN

## 2012-02-14 MED ORDER — ONDANSETRON HCL 4 MG PO TABS: 4.0000 mg | ORAL_TABLET | ORAL | Status: DC | PRN

## 2012-02-14 MED ORDER — DIPHENHYDRAMINE HCL 25 MG PO CAPS: 25.0000 mg | ORAL_CAPSULE | Freq: Four times a day (QID) | ORAL | Status: DC | PRN

## 2012-02-14 MED ORDER — PRENATAL MULTIVITAMIN CH
1.0000 | ORAL_TABLET | Freq: Every day | ORAL | Status: DC
  Administered 2012-02-15: 1 via ORAL
  Filled 2012-02-14: qty 1

## 2012-02-14 MED ORDER — OXYCODONE-ACETAMINOPHEN 5-325 MG PO TABS
1.0000 | ORAL_TABLET | ORAL | Status: DC | PRN
  Administered 2012-02-14 (×2): 1 via ORAL
  Administered 2012-02-15 (×2): 2 via ORAL
  Administered 2012-02-15 – 2012-02-16 (×3): 1 via ORAL
  Filled 2012-02-14 (×3): qty 1
  Filled 2012-02-14: qty 2
  Filled 2012-02-14 (×2): qty 1
  Filled 2012-02-14: qty 2

## 2012-02-14 MED ORDER — BENZOCAINE-MENTHOL 20-0.5 % EX AERO
1.0000 "application " | INHALATION_SPRAY | CUTANEOUS | Status: DC | PRN
  Filled 2012-02-14: qty 56

## 2012-02-14 MED ORDER — MEASLES, MUMPS & RUBELLA VAC ~~LOC~~ INJ
0.5000 mL | INJECTION | Freq: Once | SUBCUTANEOUS | Status: DC
  Filled 2012-02-14: qty 0.5

## 2012-02-14 MED ORDER — LANOLIN HYDROUS EX OINT: TOPICAL_OINTMENT | CUTANEOUS | Status: DC | PRN

## 2012-02-14 MED ORDER — SENNOSIDES-DOCUSATE SODIUM 8.6-50 MG PO TABS
2.0000 | ORAL_TABLET | Freq: Every day | ORAL | Status: DC
  Administered 2012-02-14 – 2012-02-15 (×2): 2 via ORAL

## 2012-02-14 NOTE — Progress Notes (Signed)
Subjective: Pt doing well, received epidural.  Having contractions   Objective: BP 118/73  Pulse 68  Temp 98.1 F (36.7 C) (Oral)  Resp 18  Ht 5\' 2"  (1.575 m)  Wt 95.709 kg (211 lb)  BMI 38.59 kg/m2  SpO2 97%  LMP 05/09/2011      FHT:  FHR: 140 bpm, variability: moderate,  accelerations:  Present,  decelerations:  Absent UC:   regular, every 1-3 minutes SVE:   Dilation: 3 Effacement (%): 80 Station: -1 Exam by:: a. white rn  Labs: Lab Results  Component Value Date   WBC 13.4* 02/13/2012   HGB 11.8* 02/13/2012   HCT 33.8* 02/13/2012   MCV 86.4 02/13/2012   PLT 201 02/13/2012    Assessment / Plan: Induction of labor due to gestational hypertension  Labor: Progressing normally Preeclampsia:  Protein/Creatinine ratio <0.3 Fetal Wellbeing:  Category I Pain Control:  Epidural I/D:  n/a Anticipated MOD:  NSVD  Joanna [name] 02/14/2012, 12:04 AM

## 2012-02-14 NOTE — Progress Notes (Signed)
S: Doing well, pain well-controlled with an epidural.  (+) pelvic pressure felt with every contraction.  Pushing x 1 hour.  Good fetal movement.   OCeasar Mons Vitals:   02/14/12 0901 02/14/12 0931 02/14/12 1002 02/14/12 1031  BP: 135/83 152/91 130/71 130/69  Pulse: 99 104 96 89  Temp:  98.4 F (36.9 C)    TempSrc:  Oral    Resp: 18 18 18 18   Height:      Weight:      SpO2:         FHT:  FHR: 155 bpm, variability: moderate,  accelerations:  Absent,  decelerations:  Absent, variables with pushing UC:   irregular, every 2-5 minutes SVE:   Dilation: 10 Effacement (%): 100 Station: +2;+3 Exam by:: Carloyn Jaeger, SNM   A / P: Induction of labor due to gestational hypertension Small fetal descent with pushing efforts  Fetal Wellbeing:  Category I Pain Control:  Epidural  Continue with pushing efforts for 30 more minutes Patient to rest x 1 hour, if minimal fetal descent continues Anticipated MOD:  NSVD  Raelyn Mora, SNM 02/14/2012, 10:35 AM Supervised by: Wynelle Bourgeois, CNM

## 2012-02-14 NOTE — Progress Notes (Signed)
Subjective: Pt doing well.  Having contractions with minimal pain at this point.   Objective: BP 129/68  Pulse 66  Temp 98.2 F (36.8 C) (Oral)  Resp 18  Ht 5\' 2"  (1.575 m)  Wt 95.709 kg (211 lb)  BMI 38.59 kg/m2  SpO2 97%  LMP 05/09/2011      FHT:  FHR: 140 bpm, variability: moderate,  accelerations:  Present,  decelerations:  Absent UC:   regular, every 1-3 minutes SVE:   Dilation: 4 Effacement (%): 90 Station: 0 Exam by:: heather cnm  Labs: Lab Results  Component Value Date   WBC 13.4* 02/13/2012   HGB 11.8* 02/13/2012   HCT 33.8* 02/13/2012   MCV 86.4 02/13/2012   PLT 201 02/13/2012    Assessment / Plan: Induction of labor due to gestational hypertension  Labor: Progressing normally, AROM  Preeclampsia:  Protein/Creatinine ratio <0.3 Fetal Wellbeing:  Category I Pain Control:  Epidural I/D:  n/a Anticipated MOD:  NSVD  Gildardo Cranker 02/14/2012, 1:56 AM

## 2012-02-14 NOTE — Progress Notes (Signed)
Subjective: Pt doing well.  AROM around one hour ago  Objective: BP 126/73  Pulse 93  Temp 98.4 F (36.9 C) (Oral)  Resp 18  Ht 5\' 2"  (1.575 m)  Wt 95.709 kg (211 lb)  BMI 38.59 kg/m2  SpO2 97%  LMP 05/09/2011      FHT:  FHR: 140 bpm, variability: moderate,  accelerations:  Present,  decelerations:  Absent UC:   regular, every 1-3 minutes SVE:   Dilation: 7 Effacement (%): 90 Station: 0 Exam by:: heather cnm  Labs: Lab Results  Component Value Date   WBC 13.4* 02/13/2012   HGB 11.8* 02/13/2012   HCT 33.8* 02/13/2012   MCV 86.4 02/13/2012   PLT 201 02/13/2012    Assessment / Plan: Induction of labor due to gestational hypertension  Labor: Progressing normally, AROM, will consider Pitocin  Preeclampsia:  Protein/Creatinine ratio <0.3 Fetal Wellbeing:  Category I Pain Control:  Epidural I/D:  n/a Anticipated MOD:  NSVD  Gildardo Cranker 02/14/2012, 3:36 AM

## 2012-02-14 NOTE — Anesthesia Postprocedure Evaluation (Signed)
  Anesthesia Post-op Note  Patient: Joanna [name]  Procedure(s) Performed: * No procedures listed *  Patient Location: Mother/Baby  Anesthesia Type:Epidural  Level of Consciousness: awake, alert  and oriented  Airway and Oxygen Therapy: Patient Spontanous Breathing  Post-op Pain: none  Post-op Assessment: Post-op Vital signs reviewed, Patient's Cardiovascular Status Stable, No headache, No backache, No residual numbness and No residual motor weakness  Post-op Vital Signs: Reviewed and stable  Complications: No apparent anesthesia complications

## 2012-02-14 NOTE — Progress Notes (Signed)
Seen and examined  Agree with note  Wynelle Bourgeois CNM

## 2012-02-14 NOTE — Progress Notes (Signed)
Joanna [name] is a 27 y.o. G3P0020 at [redacted]w[redacted]d by ultrasound admitted for induction of labor due to Hypertension.  Subjective:   Objective: BP 152/91  Pulse 104  Temp 98.4 F (36.9 C) (Oral)  Resp 18  Ht 5\' 2"  (1.575 m)  Wt 211 lb (95.709 kg)  BMI 38.59 kg/m2  SpO2 97%  LMP 05/09/2011 I/O last 3 completed shifts: In: 1300 [Other:1300] Out: -     FHT:  FHR: 140 bpm, variability: moderate,  accelerations:  Present,  decelerations:  Absent UC:   regular, every 2 minutes SVE:   Dilation: 10 Effacement (%): 100 Station: +2;+3 Exam by:: Carloyn Jaeger, SNM  Labs: Lab Results  Component Value Date   WBC 13.4* 02/13/2012   HGB 11.8* 02/13/2012   HCT 33.8* 02/13/2012   MCV 86.4 02/13/2012   PLT 201 02/13/2012    Assessment / Plan: Induction of labor due to gestational hypertension,  progressing well on pitocin  Labor: Progressing as expected Preeclampsia:  labs stable Fetal Wellbeing:  Category I Pain Control:  Epidural but has window of pain in right hip I/D:  n/a Anticipated MOD:  NSVD  Will start pushing.   [facility] 02/14/2012, 9:55 AM

## 2012-02-15 LAB — CBC
HCT: 27.9 % — ABNORMAL LOW (ref 36.0–46.0)
Hemoglobin: 9.6 g/dL — ABNORMAL LOW (ref 12.0–15.0)
MCH: 29.7 pg (ref 26.0–34.0)
MCHC: 34.4 g/dL (ref 30.0–36.0)
MCV: 86.4 fL (ref 78.0–100.0)
Platelets: 152 10*3/uL (ref 150–400)
RBC: 3.23 MIL/uL — ABNORMAL LOW (ref 3.87–5.11)
RDW: 13.5 % (ref 11.5–15.5)
WBC: 23.9 10*3/uL — ABNORMAL HIGH (ref 4.0–10.5)

## 2012-02-15 NOTE — Progress Notes (Signed)
Ur chart review completed per request.  

## 2012-02-15 NOTE — Discharge Summary (Signed)
Obstetric Discharge Summary Reason for Admission: induction of labor Prenatal Procedures: Preeclampsia Intrapartum Procedures: spontaneous vaginal delivery Postpartum Procedures: none Complications-Operative and Postpartum: 2nd degree perineal laceration Hemoglobin  Date Value Range Status  02/15/2012 9.6* 12.0 - 15.0 g/dL Final     REPEATED TO VERIFY     DELTA CHECK NOTED     HCT  Date Value Range Status  02/15/2012 27.9* 36.0 - 46.0 % Final    Physical Exam:  General: alert, cooperative, fatigued and no distress Lochia: appropriate Uterine Fundus: firm Incision: healing well DVT Evaluation: No evidence of DVT seen on physical exam.  Discharge Diagnoses: Term Pregnancy-delivered  Discharge Information: Date: 02/15/2012 Activity: pelvic rest Diet: routine Medications: PNV, Ibuprofen, Colace and Percocet Condition: stable Instructions: refer to practice specific booklet Discharge to: home Follow-up Information    Schedule an appointment as soon as possible for a visit with [facility].         Newborn Data: Live born female  Birth Weight: 8 lb 0.8 oz (3651 g) APGAR: 9, 9  Home with mother.  [place], Bradley 02/15/2012, 7:35 AM  I have seen the patient with the student and agree with the above.

## 2012-02-16 MED ORDER — IBUPROFEN 600 MG PO TABS: 600.0000 mg | ORAL_TABLET | Freq: Four times a day (QID) | ORAL | Status: DC

## 2012-02-16 NOTE — Progress Notes (Signed)
CSW met with pt briefly to assess her current social situation, after RN observed pt's anxious behavior.  This is pt's first baby.  She lives with FOB, Temon Herbin, who is very attentive to the baby and appropriate.  Pt admits to feeling nervous when the infant cries.  The infant has gas, which is causing some iritiablity and pt states she is learning how to burp her better.  Pt's mother plans to come stay with the couple for a couple of days to help pt and FOB, since this is their first baby.  Pt denies any history of depression or SI.  CSW discussed PP depression symptoms with the pt and provided her with literature to read.  They have all the necessary supplies for the infant and are ready to discharge home.  CSW available to assist further if needed.

## 2012-02-16 NOTE — Discharge Summary (Signed)
Attestation of Attending Supervision of Advanced Practitioner (PA/CNM/NP): Evaluation and management procedures were performed by the Advanced Practitioner under my supervision and collaboration.  I have reviewed the Advanced Practitioner's note and chart, and I agree with the management and plan.  Fahmida Jurich, MD, FACOG Attending Obstetrician & Gynecologist Faculty Practice, [facility]  

## 2012-02-16 NOTE — Discharge Summary (Signed)
Obstetric Discharge Summary Reason for Admission: induction of labor Prenatal Procedures: none Intrapartum Procedures: spontaneous vaginal delivery Postpartum Procedures: none Complications-Operative and Postpartum: 2nd degree perineal laceration Hemoglobin  Date Value Range Status  02/15/2012 9.6* 12.0 - 15.0 g/dL Final     REPEATED TO VERIFY     DELTA CHECK NOTED     HCT  Date Value Range Status  02/15/2012 27.9* 36.0 - 46.0 % Final    Physical Exam:  General: alert, cooperative and no distress Lochia: appropriate Uterine Fundus: firm Incision: healing well DVT Evaluation: No evidence of DVT seen on physical exam.  Discharge Diagnoses: Term Pregnancy-delivered  Discharge Information: Date: 02/16/2012 Activity: pelvic rest Diet: routine Medications: PNV, Ibuprofen and Colace Condition: stable Instructions: refer to practice specific booklet Discharge to: home Follow-up Information    Follow up with [facility]. Schedule an appointment as soon as possible for a visit in 6 days. (pt already has appointment made)          Newborn Data: Live born female  Birth Weight: 8 lb 0.8 oz (3651 g) APGAR: 9, 9  Home with mother.  Henn[place], Bradley 02/16/2012, 7:45 AM

## 2012-02-17 NOTE — H&P (Signed)
  UPDATED HISTORY AND PHYSICAL Please see related pre-admission H&P from Dr. Emelda Fear dated  S: Joanna [name] is a 27 y.o. [id number] presenting at [redacted]w[redacted]d gestation for induction of labor for gestational hypertension. She has had normal protein:creatinine ratios in clinic with BP in the 140s/90s for the last two weeks of pregnancy. Feeling ctx q 10 minutes. No leak of fluid or bleeding. Baby moving.   Past Medical History  Diagnosis Date  . Pregnancy induced hypertension   . 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 Normal delivery 02/14/2012    Past Surgical History  Procedure Date  . Tonsillectomy   . Adnoidectomy    History   Social History  . Marital Status: Single    Spouse Name: N/A    Number of Children: N/A  . Years of Education: N/A   Occupational History  . Not on file.   Social History Main Topics  . Smoking status: Current Every Day Smoker -- 0.2 packs/day  . Smokeless tobacco: Never Used  . Alcohol Use: No  . Drug Use: No  . Sexually Active: Yes   Other Topics Concern  . Not on file   Social History Narrative  . No narrative on file   No current facility-administered medications on file prior to encounter.   Current Outpatient Prescriptions on File Prior to Encounter  Medication Sig Dispense Refill  . acetaminophen (TYLENOL) 325 MG tablet Take 650 mg by mouth every 6 (six) hours as needed. headache      . Prenatal Vit-Fe Fumarate-FA (PRENATAL MULTIVITAMIN) TABS Take 1 tablet by mouth daily.       Allergies  Allergen Reactions  . Latex Rash    rash    ROS: No headache, vision changes, RUQ abdominal pain, fever, chills, nausea or vomiting.   O:  Filed Vitals:    02/13/12 0754  02/13/12 0800   BP:  135/99    Pulse:  101    Resp:  20    Height:   5\' 2"  (1.575 m)   Weight:   95.709 kg (211 lb)    PHYSICAL  General:  alert, cooperative, no distress and moderately obese   Skin:  normal   HEENT:  NCAT, extra ocular movement intact   Lungs:  clear  to auscultation bilaterally   Heart:  S1, S2 normal      Abdomen:  gravid uterus 37 cm EFW 7lb 10 oz 1/10     Cervix: 2/60/-2 very posterior  FHTs: 150, mod var, accels present, no decels  Vertex by ultrasound    A/P  27 y.o. G3P0020 at [redacted]w[redacted]d with GHTN  - IOL with cytotec (bishop's score 5), then pitocin  - GBS negative  - BP are 130s/high 90s. Will get labs and protein/creat ratio (was 0.17 on 1/1).  - Epidural when desired   Napoleon Form, MD  02/13/2012 9:32 AM

## 2012-04-27 ENCOUNTER — Encounter: Payer: Self-pay | Admitting: Adult Health

## 2012-04-27 ENCOUNTER — Encounter: Payer: Self-pay | Admitting: *Deleted

## 2012-11-01 ENCOUNTER — Emergency Department (HOSPITAL_COMMUNITY)
Admission: EM | Admit: 2012-11-01 | Discharge: 2012-11-02 | Disposition: A | Discharge status: Home / Self Care | Payer: Managed Care, Other (non HMO) | Attending: Emergency Medicine | Admitting: Emergency Medicine

## 2012-11-01 ENCOUNTER — Encounter (HOSPITAL_COMMUNITY): Payer: Self-pay | Admitting: *Deleted

## 2012-11-01 DIAGNOSIS — Y939 Activity, unspecified: Secondary | ICD-10-CM | POA: Insufficient documentation

## 2012-11-01 DIAGNOSIS — Z791 Long term (current) use of non-steroidal anti-inflammatories (NSAID): Secondary | ICD-10-CM | POA: Insufficient documentation

## 2012-11-01 DIAGNOSIS — Z8669 Personal history of other diseases of the nervous system and sense organs: Secondary | ICD-10-CM | POA: Insufficient documentation

## 2012-11-01 DIAGNOSIS — S335XXA Sprain of ligaments of lumbar spine, initial encounter: Secondary | ICD-10-CM | POA: Insufficient documentation

## 2012-11-01 DIAGNOSIS — X58XXXA Exposure to other specified factors, initial encounter: Secondary | ICD-10-CM | POA: Insufficient documentation

## 2012-11-01 DIAGNOSIS — Z880 Allergy status to penicillin: Secondary | ICD-10-CM | POA: Insufficient documentation

## 2012-11-01 DIAGNOSIS — Y929 Unspecified place or not applicable: Secondary | ICD-10-CM | POA: Insufficient documentation

## 2012-11-01 DIAGNOSIS — R109 Unspecified abdominal pain: Secondary | ICD-10-CM | POA: Insufficient documentation

## 2012-11-01 DIAGNOSIS — S39012A Strain of muscle, fascia and tendon of lower back, initial encounter: Secondary | ICD-10-CM

## 2012-11-01 DIAGNOSIS — Z91040 Latex allergy status: Secondary | ICD-10-CM | POA: Insufficient documentation

## 2012-11-01 DIAGNOSIS — F17200 Nicotine dependence, unspecified, uncomplicated: Secondary | ICD-10-CM | POA: Insufficient documentation

## 2012-11-01 DIAGNOSIS — Z3202 Encounter for pregnancy test, result negative: Secondary | ICD-10-CM | POA: Insufficient documentation

## 2012-11-01 DIAGNOSIS — Z79899 Other long term (current) drug therapy: Secondary | ICD-10-CM | POA: Insufficient documentation

## 2012-11-01 NOTE — ED Notes (Signed)
Pt c/o lower back pain that radiates around both sides into her abdomen. Pt also states her urine has a foul odor. Denies dysuria.

## 2012-11-02 ENCOUNTER — Emergency Department (HOSPITAL_COMMUNITY): Payer: Managed Care, Other (non HMO)

## 2012-11-02 LAB — URINALYSIS, ROUTINE W REFLEX MICROSCOPIC
Bilirubin Urine: NEGATIVE
Glucose, UA: NEGATIVE mg/dL
Hgb urine dipstick: NEGATIVE
Ketones, ur: NEGATIVE mg/dL
Leukocytes, UA: NEGATIVE
Nitrite: NEGATIVE
Protein, ur: NEGATIVE mg/dL
Specific Gravity, Urine: <1.005 — ABNORMAL LOW (ref 1.005–1.030)
Urobilinogen, UA: 0.2 mg/dL (ref 0.0–1.0)
pH: 6.5 (ref 5.0–8.0)

## 2012-11-02 LAB — PREGNANCY, URINE: Preg Test, Ur: NEGATIVE

## 2012-11-02 MED ORDER — CYCLOBENZAPRINE HCL 10 MG PO TABS
10.0000 mg | ORAL_TABLET | Freq: Once | ORAL | Status: AC
  Administered 2012-11-02: 10 mg via ORAL
  Filled 2012-11-02: qty 1

## 2012-11-02 MED ORDER — IBUPROFEN 400 MG PO TABS
400.0000 mg | ORAL_TABLET | Freq: Once | ORAL | Status: AC
  Administered 2012-11-02: 400 mg via ORAL
  Filled 2012-11-02: qty 1

## 2012-11-02 MED ORDER — CYCLOBENZAPRINE HCL 5 MG PO TABS: 5.0000 mg | ORAL_TABLET | Freq: Three times a day (TID) | ORAL | Status: DC | PRN

## 2012-11-02 MED ORDER — IBUPROFEN 600 MG PO TABS: 600.0000 mg | ORAL_TABLET | Freq: Four times a day (QID) | ORAL | Status: DC | PRN

## 2012-11-04 NOTE — ED Provider Notes (Signed)
CSN: [id number]     Arrival date & time 11/01/12  2252 History   First MD Initiated Contact with Patient 11/02/12 0008     Chief Complaint  Patient presents with  . Back Pain  . Abdominal Pain   (Consider location/radiation/quality/duration/timing/severity/associated sxs/prior Treatment) HPI Comments: Joanna [name] is a 27 y.o. Female presenting with midline lower back pain radiating into her bilateral lower flank which has been present for the past several days.  She denies any injury and does not have a history of back problems, but pain is worse with palpation and movement.  She also notes that her urine has smelled stronger than normal.  She denies fevers, chills, hematuria, dysuria and has had no abdominal pain or vaginal discharge.    There is no radiation of pain into her legs.  There has been no weakness or numbness in the lower extremities and no urinary or bowel retention or incontinence.  Patient does not have a history of cancer or IVDU. She has taken tylenol without relief of pain.      Patient is a 27 y.o. female presenting with abdominal pain. The history is provided by the patient.  Abdominal Pain Associated symptoms: no chest pain, no constipation, no diarrhea, no dysuria, no fever, no nausea, no shortness of breath, no vaginal discharge and no vomiting     Past Medical History  Diagnosis Date  . 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 Normal delivery 02/14/2012   Past Surgical History  Procedure Laterality Date  . Tonsillectomy    . Adnoidectomy     Family History  Problem Relation Age of Onset  . Hepatitis Mother   . Diabetes Mother     borderline  . Heart disease Mother   . Depression Mother   . Hyperlipidemia Father   . AAA (abdominal aortic aneurysm) Paternal Grandfather    History  Substance Use Topics  . Smoking status: Current Every Day Smoker -- 0.25 packs/day  . Smokeless tobacco: Never Used  . Alcohol Use: No   OB History   Grav Para Term  Preterm Abortions TAB SAB Ect Mult Living   3 1 1  0 2 0 2 0 0 1     Review of Systems  Constitutional: Negative for fever.  Respiratory: Negative for shortness of breath.   Cardiovascular: Negative for chest pain and leg swelling.  Gastrointestinal: Negative for nausea, vomiting, abdominal pain, diarrhea, constipation and abdominal distention.  Genitourinary: Negative for dysuria, urgency, frequency, flank pain, vaginal discharge and difficulty urinating.  Musculoskeletal: Positive for back pain. Negative for joint swelling and gait problem.  Skin: Negative for rash.  Neurological: Negative for weakness and numbness.    Allergies  Amoxicillin and Latex  Home Medications   Current Outpatient Rx  Name  Route  Sig  Dispense  Refill  . acetaminophen (TYLENOL) 325 MG tablet   Oral   Take 650 mg by mouth every 6 (six) hours as needed. headache         . cyclobenzaprine (FLEXERIL) 5 MG tablet   Oral   Take 1 tablet (5 mg total) by mouth 3 (three) times daily as needed for muscle spasms.   15 tablet   0   . ibuprofen (ADVIL,MOTRIN) 600 MG tablet   Oral   Take 1 tablet (600 mg total) by mouth every 6 (six) hours.   50 tablet   1   . ibuprofen (ADVIL,MOTRIN) 600 MG tablet   Oral  Take 1 tablet (600 mg total) by mouth every 6 (six) hours as needed for pain.   20 tablet   0   . Prenatal Vit-Fe Fumarate-FA (PRENATAL MULTIVITAMIN) TABS   Oral   Take 1 tablet by mouth daily.          BP 135/77  Pulse 83  Temp(Src) 98.3 F (36.8 C) (Oral)  Resp 24  Ht 5\' 2"  (1.575 m)  Wt 190 lb 12.8 oz (86.546 kg)  BMI 34.89 kg/m2  SpO2 100%  LMP 10/21/2012 Physical Exam  Nursing note and vitals reviewed. Constitutional: She appears well-developed and well-nourished.  HENT:  Head: Normocephalic.  Eyes: Conjunctivae are normal.  Neck: Normal range of motion. Neck supple.  Cardiovascular: Normal rate and intact distal pulses.   Pedal pulses normal.  Pulmonary/Chest: Effort  normal.  Abdominal: Soft. Bowel sounds are normal. She exhibits no distension and no mass.  Musculoskeletal: Normal range of motion. She exhibits tenderness. She exhibits no edema.       Lumbar back: She exhibits tenderness. She exhibits no swelling, no edema and no spasm.  Lumbar and paralumbar ttp.  No deformity , no rash noted.  Neurological: She is alert. She has normal strength. She displays no atrophy and no tremor. No sensory deficit. Gait normal.  Reflex Scores:      Patellar reflexes are 2+ on the right side and 2+ on the left side.      Achilles reflexes are 2+ on the right side and 2+ on the left side. No strength deficit noted in hip and knee flexor and extensor muscle groups.  Ankle flexion and extension intact.  Skin: Skin is warm and dry.  Psychiatric: She has a normal mood and affect.    ED Course  Procedures (including critical care time) Labs Review Labs Reviewed  URINALYSIS, ROUTINE W REFLEX MICROSCOPIC - Abnormal; Notable for the following:    Specific Gravity, Urine <1.005 (*)    All other components within normal limits  PREGNANCY, URINE   Imaging Review No results found.  MDM   1. Lumbar strain, initial encounter    Patients labs and/or radiological studies were viewed and considered during the medical decision making and disposition process. No urinary infection, no hematuria,  Exam reproduces pain with palpation.  Suspect musculoskeletal source.  She was prescribed ibuprofen,  Flexeril,  Encouraged heat tx,  Rest.  Recheck by pcp if sx not improved over the next 5 days.    Burgess Amor, PA-C 11/04/12 1236

## 2012-11-04 NOTE — ED Provider Notes (Signed)
Medical screening examination/treatment/procedure(s) were performed by non-physician practitioner and as supervising physician I was immediately available for consultation/collaboration.   Benny Lennert, MD 11/04/12 [phone]

## 2013-12-03 ENCOUNTER — Encounter (HOSPITAL_COMMUNITY): Payer: Self-pay | Admitting: *Deleted

## 2014-07-23 ENCOUNTER — Emergency Department (HOSPITAL_COMMUNITY)
Admission: EM | Admit: 2014-07-23 | Discharge: 2014-07-23 | Disposition: A | Discharge status: Home / Self Care | Payer: Managed Care, Other (non HMO)

## 2014-11-22 ENCOUNTER — Encounter (HOSPITAL_COMMUNITY): Payer: Self-pay | Admitting: *Deleted

## 2014-11-22 ENCOUNTER — Emergency Department (HOSPITAL_COMMUNITY)
Admission: EM | Admit: 2014-11-22 | Discharge: 2014-11-22 | Disposition: A | Discharge status: Home / Self Care | Payer: Managed Care, Other (non HMO) | Attending: Emergency Medicine | Admitting: Emergency Medicine

## 2014-11-22 DIAGNOSIS — K047 Periapical abscess without sinus: Secondary | ICD-10-CM | POA: Insufficient documentation

## 2014-11-22 DIAGNOSIS — Z8661 Personal history of infections of the central nervous system: Secondary | ICD-10-CM | POA: Diagnosis not present

## 2014-11-22 DIAGNOSIS — Z880 Allergy status to penicillin: Secondary | ICD-10-CM | POA: Insufficient documentation

## 2014-11-22 DIAGNOSIS — Z79899 Other long term (current) drug therapy: Secondary | ICD-10-CM | POA: Diagnosis not present

## 2014-11-22 DIAGNOSIS — K0889 Other specified disorders of teeth and supporting structures: Secondary | ICD-10-CM | POA: Diagnosis present

## 2014-11-22 DIAGNOSIS — K0381 Cracked tooth: Secondary | ICD-10-CM | POA: Diagnosis not present

## 2014-11-22 DIAGNOSIS — Z91040 Latex allergy status: Secondary | ICD-10-CM | POA: Insufficient documentation

## 2014-11-22 DIAGNOSIS — Z720 Tobacco use: Secondary | ICD-10-CM | POA: Diagnosis not present

## 2014-11-22 MED ORDER — CLINDAMYCIN HCL 150 MG PO CAPS
300.0000 mg | ORAL_CAPSULE | Freq: Once | ORAL | Status: AC
  Administered 2014-11-22: 300 mg via ORAL
  Filled 2014-11-22: qty 2

## 2014-11-22 MED ORDER — CLINDAMYCIN HCL 150 MG PO CAPS: 300.0000 mg | ORAL_CAPSULE | Freq: Three times a day (TID) | ORAL | Status: DC

## 2014-11-22 MED ORDER — HYDROCODONE-ACETAMINOPHEN 5-325 MG PO TABS: 2.0000 | ORAL_TABLET | ORAL | Status: DC | PRN

## 2014-11-22 NOTE — ED Provider Notes (Signed)
CSN: [id number]     Arrival date & time 11/22/14  0003 History   First MD Initiated Contact with Patient 11/22/14 0009     Chief Complaint  Patient presents with  . Dental Pain     (Consider location/radiation/quality/duration/timing/severity/associated sxs/prior Treatment) Patient is a 29 y.o. female presenting with tooth pain. The history is provided by the patient.  Dental Pain Location:  Upper Upper teeth location:  1/RU 3rd molar Quality:  Throbbing Severity:  Severe Onset quality:  Gradual Duration:  1 day Timing:  Constant Progression:  Worsening Chronicity:  New Context: dental fracture   Relieved by:  Nothing Worsened by:  Cold food/drink  [place] is a 29 y.o. female who presents to the ED with pain to the right upper jaw that radiates to the right ear. She states her wisdom tooth has broken off and the pain started yesterday afternoon. She has an appointment with the oral surgeon next week but when she saw the swelling on her gum and had increased pain she came in for antibiotics and pain medication.   Past Medical History  Diagnosis Date  . Meningitis spinal     29 years old  . Normal delivery 02/14/2012   Past Surgical History  Procedure Laterality Date  . Tonsillectomy    . Adnoidectomy     Family History  Problem Relation Age of Onset  . Hepatitis Mother   . Diabetes Mother     borderline  . Heart disease Mother   . Depression Mother   . Hyperlipidemia Father   . AAA (abdominal aortic aneurysm) Paternal Grandfather    Social History  Substance Use Topics  . Smoking status: Current Every Day Smoker -- 0.25 packs/day  . Smokeless tobacco: Never Used  . Alcohol Use: No   OB History    Gravida Para Term Preterm AB TAB SAB Ectopic Multiple Living   0 2 0 2 0 0 1     Review of Systems  HENT: Positive for dental problem.   all other systems negative    Allergies  Amoxicillin and Latex  Home Medications   Prior to Admission  medications   Medication Sig Start Date End Date Taking? Authorizing Provider  acetaminophen (TYLENOL) 325 MG tablet Take 650 mg by mouth every 6 (six) hours as needed. headache    Historical Provider, MD  clindamycin (CLEOCIN) 150 MG capsule Take 2 capsules (300 mg total) by mouth 3 (three) times daily. 11/22/14   Shalisha Clausing Orlene Och, NP  cyclobenzaprine (FLEXERIL) 5 MG tablet Take 1 tablet (5 mg total) by mouth 3 (three) times daily as needed for muscle spasms. 11/02/12   Burgess Amor, PA-C  HYDROcodone-acetaminophen (NORCO/VICODIN) 5-325 MG tablet Take 2 tablets by mouth every 4 (four) hours as needed. 11/22/14   Almer Littleton Orlene Och, NP  ibuprofen (ADVIL,MOTRIN) 600 MG tablet Take 1 tablet (600 mg total) by mouth every 6 (six) hours. 02/16/12   Arabella Merles, CNM  ibuprofen (ADVIL,MOTRIN) 600 MG tablet Take 1 tablet (600 mg total) by mouth every 6 (six) hours as needed for pain. 11/02/12   Burgess Amor, PA-C  Prenatal Vit-Fe Fumarate-FA (PRENATAL MULTIVITAMIN) TABS Take 1 tablet by mouth daily.    Historical Provider, MD   BP 131/87 mmHg  Pulse 103  Temp(Src) 98.3 F (36.8 C) (Oral)  Resp 20  Ht  (1.575 m)  Wt 201 lb (91.173 kg)  BMI 36.75 kg/m2  SpO2 100%  LMP 11/08/2014 Physical Exam  Constitutional: She is oriented to person, place, and time. She appears well-developed and well-nourished.  HENT:  Head: Normocephalic and atraumatic.  Nose: Nose normal.  Mouth/Throat: Uvula is midline, oropharynx is clear and moist and mucous membranes are normal.    The right upper third molar is broken and there is an abscess noted to the gum above the tooth. There is tenderness on exam  Eyes: EOM are normal.  Neck: Neck supple.  Cardiovascular: Normal rate and regular rhythm.   Pulmonary/Chest: Effort normal and breath sounds normal. No respiratory distress. She has no wheezes.  Musculoskeletal: Normal range of motion.  Neurological: She is alert and oriented to person, place, and time. No cranial nerve  deficit.  Skin: Skin is warm and dry.  Psychiatric: She has a normal mood and affect. Her behavior is normal.  Nursing note and vitals reviewed.   ED Course  Procedures   MDM  29 y.o. female with broken tooth and abscess stable for d/c without fever and does not appear toxic. Will treat with Clindamycin and hydrocodone. She will continue to take ibuprofen and keep her follow up appointment with the oral surgeon next week.   Final diagnoses:  Dental abscess       Janne NapoleonHope M Shaden Higley, NP 11/22/14 0115  Dione Boozeavid Glick, MD 11/22/14 0600

## 2014-11-22 NOTE — ED Notes (Signed)
Patient was given prepackage of hydrocodone-acetaminophen quantity six and instructions on use.

## 2014-11-22 NOTE — ED Notes (Signed)
Pt c/o pain to right upper jaw with the pain radiating to ear and side of head; pt states her wisdom tooth has broken off; pt started yesterday evening around 5pm

## 2014-11-22 NOTE — Discharge Instructions (Signed)

## 2014-12-09 MED FILL — Hydrocodone-Acetaminophen Tab 5-325 MG: ORAL | Qty: 6 | Status: AC

## 2015-01-17 ENCOUNTER — Emergency Department (HOSPITAL_COMMUNITY)
Admission: EM | Admit: 2015-01-17 | Discharge: 2015-01-17 | Disposition: A | Discharge status: Home / Self Care | Payer: Managed Care, Other (non HMO) | Attending: Emergency Medicine | Admitting: Emergency Medicine

## 2015-01-17 ENCOUNTER — Encounter (HOSPITAL_COMMUNITY): Payer: Self-pay | Admitting: *Deleted

## 2015-01-17 DIAGNOSIS — Z880 Allergy status to penicillin: Secondary | ICD-10-CM | POA: Insufficient documentation

## 2015-01-17 DIAGNOSIS — K0889 Other specified disorders of teeth and supporting structures: Secondary | ICD-10-CM | POA: Diagnosis not present

## 2015-01-17 DIAGNOSIS — R42 Dizziness and giddiness: Secondary | ICD-10-CM

## 2015-01-17 DIAGNOSIS — Z792 Long term (current) use of antibiotics: Secondary | ICD-10-CM | POA: Diagnosis not present

## 2015-01-17 DIAGNOSIS — Z91040 Latex allergy status: Secondary | ICD-10-CM | POA: Insufficient documentation

## 2015-01-17 DIAGNOSIS — Z3202 Encounter for pregnancy test, result negative: Secondary | ICD-10-CM | POA: Diagnosis not present

## 2015-01-17 DIAGNOSIS — F17210 Nicotine dependence, cigarettes, uncomplicated: Secondary | ICD-10-CM | POA: Insufficient documentation

## 2015-01-17 DIAGNOSIS — R55 Syncope and collapse: Secondary | ICD-10-CM | POA: Insufficient documentation

## 2015-01-17 DIAGNOSIS — H539 Unspecified visual disturbance: Secondary | ICD-10-CM | POA: Diagnosis not present

## 2015-01-17 DIAGNOSIS — Z8669 Personal history of other diseases of the nervous system and sense organs: Secondary | ICD-10-CM | POA: Insufficient documentation

## 2015-01-17 DIAGNOSIS — M5382 Other specified dorsopathies, cervical region: Secondary | ICD-10-CM | POA: Diagnosis present

## 2015-01-17 LAB — CBC WITH DIFFERENTIAL/PLATELET
Basophils Absolute: 0 10*3/uL (ref 0.0–0.1)
Basophils Relative: 0 %
Eosinophils Absolute: 0.3 10*3/uL (ref 0.0–0.7)
Eosinophils Relative: 3 %
HCT: 37.6 % (ref 36.0–46.0)
Hemoglobin: 12.8 g/dL (ref 12.0–15.0)
Lymphocytes Relative: 34 %
Lymphs Abs: 3.3 10*3/uL (ref 0.7–4.0)
MCH: 29.8 pg (ref 26.0–34.0)
MCHC: 34 g/dL (ref 30.0–36.0)
MCV: 87.6 fL (ref 78.0–100.0)
Monocytes Absolute: 0.9 10*3/uL (ref 0.1–1.0)
Monocytes Relative: 9 %
Neutro Abs: 5.3 10*3/uL (ref 1.7–7.7)
Neutrophils Relative %: 54 %
Platelets: 216 10*3/uL (ref 150–400)
RBC: 4.29 MIL/uL (ref 3.87–5.11)
RDW: 13.2 % (ref 11.5–15.5)
WBC: 9.8 10*3/uL (ref 4.0–10.5)

## 2015-01-17 LAB — BASIC METABOLIC PANEL
Anion gap: 8 (ref 5–15)
BUN: 15 mg/dL (ref 6–20)
CO2: 24 mmol/L (ref 22–32)
Calcium: 10.5 mg/dL — ABNORMAL HIGH (ref 8.9–10.3)
Chloride: 106 mmol/L (ref 101–111)
Creatinine, Ser: 0.79 mg/dL (ref 0.44–1.00)
GFR calc Af Amer: >60 mL/min (ref >60)
GFR calc non Af Amer: >60 mL/min (ref >60)
Glucose, Bld: 92 mg/dL (ref 65–99)
Potassium: 4 mmol/L (ref 3.5–5.1)
Sodium: 138 mmol/L (ref 135–145)

## 2015-01-17 LAB — I-STAT BETA HCG BLOOD, ED (MC, WL, AP ONLY): I-stat hCG, quantitative: <5 m[IU]/mL (ref <5)

## 2015-01-17 MED ORDER — MECLIZINE HCL 25 MG PO TABS: 25.0000 mg | ORAL_TABLET | Freq: Three times a day (TID) | ORAL | Status: DC | PRN

## 2015-01-17 NOTE — ED Provider Notes (Signed)
CSN: [id number]     Arrival date & time 01/17/15  1913 History   First MD Initiated Contact with Patient 01/17/15 1937     Chief Complaint  Patient presents with  . neck stiffness      (Consider location/radiation/quality/duration/timing/severity/associated sxs/prior Treatment) The history is provided by the patient.   29 year old female followed by [place] [facility]. Patient now with 2 episodes of dizziness with a vertical component but both pre-. First episode occurred on Wednesday when she moved her head to the side this evoked little bit of a anxiety and a panic attack. Patient felt as if maybe she was going to pass out. Tonight at work a similar event occurred she had some dizziness felt like maybe she would pass out. No significant headache did complain of neck stiffness in triage however doesn't really have neck stiffness does have a bed was them to fall in the right upper wisdom tooth which is being treated with antibiotics. Patient with no upper respiratory symptoms. Of some visual changes with this more due to the dizziness then blacking or graying out of vision. Little bit of blurring of vision. Patient has a family history in that her father had a brain aneurysm she has some concerns about that and also concerns about brain tumor. Symptoms have been very episodic is only been 2 episodes while on Wednesday and one today.  Past Medical History  Diagnosis Date  . Meningitis spinal     29 years old  . Normal delivery 02/14/2012   Past Surgical History  Procedure Laterality Date  . Tonsillectomy    . Adnoidectomy     Family History  Problem Relation Age of Onset  . Hepatitis Mother   . Diabetes Mother     borderline  . Heart disease Mother   . Depression Mother   . Hyperlipidemia Father   . AAA (abdominal aortic aneurysm) Paternal Grandfather    Social History  Substance Use Topics  . Smoking status: Current Every Day Smoker -- 0.25 packs/day  . Smokeless tobacco: Never Used   . Alcohol Use: No   OB History    Gravida Para Term Preterm AB TAB SAB Ectopic Multiple Living   0 2 0 2 0 0 1     Review of Systems  Constitutional: Negative for fever.  HENT: Positive for dental problem. Negative for congestion.   Eyes: Positive for visual disturbance.  Respiratory: Negative for shortness of breath.   Cardiovascular: Negative for chest pain.  Gastrointestinal: Negative for nausea, vomiting and abdominal pain.  Genitourinary: Negative for dysuria.  Musculoskeletal: Negative for back pain and neck pain.  Skin: Negative for rash.  Neurological: Positive for dizziness and light-headedness.      Allergies  Amoxicillin and Latex  Home Medications   Prior to Admission medications   Medication Sig Start Date End Date Taking? Authorizing Provider  clindamycin (CLEOCIN) 150 MG capsule Take 2 capsules (300 mg total) by mouth 3 (three) times daily. 11/22/14  Yes Hope Orlene Och, NP  ibuprofen (ADVIL,MOTRIN) 600 MG tablet Take 1 tablet (600 mg total) by mouth every 6 (six) hours. 02/16/12  Yes Arabella Merles, CNM  HYDROcodone-acetaminophen (NORCO/VICODIN) 5-325 MG tablet Take 2 tablets by mouth every 4 (four) hours as needed. Patient not taking: Reported on 01/17/2015 11/22/14   Janne Napoleon, NP   BP 139/87 mmHg  Pulse 80  Temp(Src) 98.2 F (36.8 C) (Oral)  Resp 20  Ht  (1.575 m)  Wt  89.359 kg  BMI 36.02 kg/m2  SpO2 99%  LMP 12/30/2014 Physical Exam  Constitutional: She is oriented to person, place, and time. She appears well-developed and well-nourished. No distress.  HENT:  Head: Normocephalic and atraumatic.  Mouth/Throat: Oropharynx is clear and moist.  Eyes: Conjunctivae and EOM are normal. Pupils are equal, round, and reactive to light.  Neck: Normal range of motion. Neck supple.  Cardiovascular: Normal rate, regular rhythm and normal heart sounds.   No murmur heard. Pulmonary/Chest: Effort normal and breath sounds normal. No respiratory  distress.  Abdominal: Soft. Bowel sounds are normal. There is no tenderness.  Musculoskeletal: Normal range of motion.  Neurological: She is alert and oriented to person, place, and time. No cranial nerve deficit. She exhibits normal muscle tone. Coordination normal.  Skin: Skin is warm. No rash noted.  Nursing note and vitals reviewed.   ED Course  Procedures (including critical care time) Labs Review Labs Reviewed  CBC WITH DIFFERENTIAL/PLATELET  BASIC METABOLIC PANEL  I-STAT BETA HCG BLOOD, ED ([facility], [facility], [facility] ONLY)   Results for orders placed or performed during the hospital encounter of 01/17/15  Basic metabolic panel  Result Value Ref Range   Sodium 138 135 - 145 mmol/L   Potassium 4.0 3.5 - 5.1 mmol/L   Chloride 106 101 - 111 mmol/L   CO2 24 22 - 32 mmol/L   Glucose, Bld 92 65 - 99 mg/dL   BUN 15 6 - 20 mg/dL   Creatinine, Ser 1.610.79 0.44 - 1.00 mg/dL   Calcium 09.610.5 (H) 8.9 - 10.3 mg/dL   GFR calc non Af Amer >60 >60 mL/min   GFR calc Af Amer >60 >60 mL/min   Anion gap 8 5 - 15  CBC with Differential/Platelet  Result Value Ref Range   WBC 9.8 4.0 - 10.5 K/uL   RBC 4.29 3.87 - 5.11 MIL/uL   Hemoglobin 12.8 12.0 - 15.0 g/dL   HCT 04.537.6 40.936.0 - 81.146.0 %   MCV 87.6 78.0 - 100.0 fL   MCH 29.8 26.0 - 34.0 pg   MCHC 34.0 30.0 - 36.0 g/dL   RDW 91.413.2 78.211.5 - 95.615.5 %   Platelets 216 150 - 400 K/uL   Neutrophils Relative % 54 %   Neutro Abs 5.3 1.7 - 7.7 K/uL   Lymphocytes Relative 34 %   Lymphs Abs 3.3 0.7 - 4.0 K/uL   Monocytes Relative 9 %   Monocytes Absolute 0.9 0.1 - 1.0 K/uL   Eosinophils Relative 3 %   Eosinophils Absolute 0.3 0.0 - 0.7 K/uL   Basophils Relative 0 %   Basophils Absolute 0.0 0.0 - 0.1 K/uL  I-Stat Beta hCG blood, ED ([facility], [facility], [facility] only)  Result Value Ref Range   I-stat hCG, quantitative <5.0 <5 mIU/mL   Comment 3             Imaging Review No results found. I have personally reviewed and evaluated these images and lab results as part of my medical  decision-making.   EKG Interpretation   Date/Time:  Friday January 17 2015 20:02:38 EST Ventricular Rate:  82 PR Interval:  137 QRS Duration: 86 QT Interval:  370 QTC Calculation: 432 R Axis:   57 Text Interpretation:  Sinus rhythm Confirmed by Marcey Persad  MD, Karey Stucki  [phone]([id number]) on 01/17/2015 8:21:50 PM      MDM   Final diagnoses:  Dizziness  Near syncope    Patient with 2 episodes of brief dizziness first one was on Wednesday  came on suddenly when she moves her head to the side. That resolved. Had another episode while at work tonight very brief now resolved and also both times felt as if maybe she would pass out. No evidence of any significant headache in triage complained of neck stiffness but no real complaint of that. Patient is known to have a bad wisdom tooth on the right upper tooth on antibiotics for that currently. It is possible this could be playing a role. Patient has a history in her father of a brain aneurysm and she has some concerns about that. Has not been evaluated for that in the past.  Episodes seem to be some mild of vertigo very episodic no headaches no evidence of any neck stiffness on exam. If basic labs are normal would recommend follow-up with primary care doctor and consideration for MRI brain if symptoms persist. Also could get evaluation that way for possible aneurysm. Clinically nontoxic no acute distress. EKG without any acute findings. Patient not febrile no significant vital sign abnormalities.    Vanetta Mulders, MD 01/17/15 2132

## 2015-01-17 NOTE — ED Notes (Signed)
EKG given to Dr. Zackowski  

## 2015-01-17 NOTE — ED Notes (Signed)
Pt c/o neck stiffness and dizziness since Wednesday. Pt states on Wednesday, she had blurred vision and when she stood up, she stumbled around. Pt states she was at work tonight and had the same feeling.

## 2015-01-17 NOTE — Discharge Instructions (Signed)
Dizziness Dizziness is a common problem. It makes you feel unsteady or lightheaded. You may feel like you are about to pass out (faint). Dizziness can lead to injury if you stumble or fall. Anyone can get dizzy, but dizziness is more common in older adults. This condition can be caused by a number of things, including:  Medicines.  Dehydration.  Illness. HOME CARE Following these instructions may help with your condition: Eating and Drinking  Drink enough fluid to keep your pee (urine) clear or pale yellow. This helps to keep you from getting dehydrated. Try to drink more clear fluids, such as water.  Do not drink alcohol.  Limit how much caffeine you drink or eat if told by your doctor.  Limit how much salt you drink or eat if told by your doctor. Activity  Avoid making quick movements.  When you stand up from sitting in a chair, steady yourself until you feel okay.  In the morning, first sit up on the side of the bed. When you feel okay, stand slowly while you hold onto something. Do this until you know that your balance is fine.  Move your legs often if you need to stand in one place for a long time. Tighten and relax your muscles in your legs while you are standing.  Do not drive or use heavy machinery if you feel dizzy.  Avoid bending down if you feel dizzy. Place items in your home so that they are easy for you to reach without leaning over. Lifestyle  Do not use any tobacco products, including cigarettes, chewing tobacco, or electronic cigarettes. If you need help quitting, ask your doctor.  Try to lower your stress level, such as with yoga or meditation. Talk with your doctor if you need help. General Instructions  Watch your dizziness for any changes.  Take medicines only as told by your doctor. Talk with your doctor if you think that your dizziness is caused by a medicine that you are taking.  Tell a friend or a family member that you are feeling dizzy. If he or  she notices any changes in your behavior, have this person call your doctor.  Keep all follow-up visits as told by your doctor. This is important. GET HELP IF:  Your dizziness does not go away.  Your dizziness or light-headedness gets worse.  You feel sick to your stomach (nauseous).  You have trouble hearing.  You have new symptoms.  You are unsteady on your feet or you feel like the room is spinning. GET HELP RIGHT AWAY IF:  You throw up (vomit) or have diarrhea and are unable to eat or drink anything.  You have trouble:  Talking.  Walking.  Swallowing.  Using your arms, hands, or legs.  You feel generally weak.  You are not thinking clearly or you have trouble forming sentences. It may take a friend or family member to notice this.  You have:  Chest pain.  Pain in your belly (abdomen).  Shortness of breath.  Sweating.  Your vision changes.  You are bleeding.  You have a headache.  You have neck pain or a stiff neck.  You have a fever.   This information is not intended to replace advice given to you by your health care provider. Make sure you discuss any questions you have with your health care provider.  Make an appointment to follow-up with your doctor for further evaluation if symptoms persist. Would be appropriate to do MRI of brain  if they persist. If you have episodes that last long enough you can take the anti-GERD medication prescribed. Return for any new or worse symptoms. Labs and EKG without any acute findings.   Document Released: 01/07/2011 Document Revised: 06/04/2014 Document Reviewed: 01/14/2014 Elsevier Interactive Patient Education [employer]

## 2015-07-17 ENCOUNTER — Emergency Department (HOSPITAL_COMMUNITY): Payer: Medicaid Other

## 2015-07-17 ENCOUNTER — Emergency Department (HOSPITAL_COMMUNITY)
Admission: EM | Admit: 2015-07-17 | Discharge: 2015-07-17 | Disposition: A | Discharge status: Home / Self Care | Payer: Medicaid Other | Attending: Emergency Medicine | Admitting: Emergency Medicine

## 2015-07-17 ENCOUNTER — Encounter (HOSPITAL_COMMUNITY): Payer: Self-pay

## 2015-07-17 DIAGNOSIS — Q339 Congenital malformation of lung, unspecified: Secondary | ICD-10-CM | POA: Insufficient documentation

## 2015-07-17 DIAGNOSIS — Z791 Long term (current) use of non-steroidal anti-inflammatories (NSAID): Secondary | ICD-10-CM | POA: Insufficient documentation

## 2015-07-17 DIAGNOSIS — Q338 Other congenital malformations of lung: Secondary | ICD-10-CM

## 2015-07-17 DIAGNOSIS — M542 Cervicalgia: Secondary | ICD-10-CM | POA: Diagnosis not present

## 2015-07-17 DIAGNOSIS — G44209 Tension-type headache, unspecified, not intractable: Secondary | ICD-10-CM | POA: Diagnosis not present

## 2015-07-17 DIAGNOSIS — R51 Headache: Secondary | ICD-10-CM | POA: Diagnosis present

## 2015-07-17 DIAGNOSIS — F17200 Nicotine dependence, unspecified, uncomplicated: Secondary | ICD-10-CM | POA: Insufficient documentation

## 2015-07-17 LAB — BASIC METABOLIC PANEL
Anion gap: 4 — ABNORMAL LOW (ref 5–15)
BUN: 12 mg/dL (ref 6–20)
CO2: 21 mmol/L — ABNORMAL LOW (ref 22–32)
Calcium: 8.6 mg/dL — ABNORMAL LOW (ref 8.9–10.3)
Chloride: 110 mmol/L (ref 101–111)
Creatinine, Ser: 0.75 mg/dL (ref 0.44–1.00)
GFR calc Af Amer: >60 mL/min (ref >60)
GFR calc non Af Amer: >60 mL/min (ref >60)
Glucose, Bld: 93 mg/dL (ref 65–99)
Potassium: 3.9 mmol/L (ref 3.5–5.1)
Sodium: 135 mmol/L (ref 135–145)

## 2015-07-17 LAB — CBC WITH DIFFERENTIAL/PLATELET
Basophils Absolute: 0 10*3/uL (ref 0.0–0.1)
Basophils Relative: 0 %
Eosinophils Absolute: 0.2 10*3/uL (ref 0.0–0.7)
Eosinophils Relative: 2 %
HCT: 36.7 % (ref 36.0–46.0)
Hemoglobin: 12.4 g/dL (ref 12.0–15.0)
Lymphocytes Relative: 26 %
Lymphs Abs: 2.1 10*3/uL (ref 0.7–4.0)
MCH: 29.2 pg (ref 26.0–34.0)
MCHC: 33.8 g/dL (ref 30.0–36.0)
MCV: 86.6 fL (ref 78.0–100.0)
Monocytes Absolute: 0.8 10*3/uL (ref 0.1–1.0)
Monocytes Relative: 9 %
Neutro Abs: 5 10*3/uL (ref 1.7–7.7)
Neutrophils Relative %: 63 %
Platelets: 182 10*3/uL (ref 150–400)
RBC: 4.24 MIL/uL (ref 3.87–5.11)
RDW: 13.7 % (ref 11.5–15.5)
WBC: 8 10*3/uL (ref 4.0–10.5)

## 2015-07-17 LAB — URINE MICROSCOPIC-ADD ON: RBC / HPF: NONE SEEN RBC/hpf (ref 0–5)

## 2015-07-17 LAB — URINALYSIS, ROUTINE W REFLEX MICROSCOPIC
Bilirubin Urine: NEGATIVE
Glucose, UA: NEGATIVE mg/dL
Ketones, ur: NEGATIVE mg/dL
Leukocytes, UA: NEGATIVE
Nitrite: NEGATIVE
Protein, ur: NEGATIVE mg/dL
Specific Gravity, Urine: 1.025 (ref 1.005–1.030)
pH: 6 (ref 5.0–8.0)

## 2015-07-17 LAB — POC URINE PREG, ED: Preg Test, Ur: NEGATIVE

## 2015-07-17 MED ORDER — IOPAMIDOL (ISOVUE-300) INJECTION 61%
75.0000 mL | Freq: Once | INTRAVENOUS | Status: AC | PRN
  Administered 2015-07-17: 75 mL via INTRAVENOUS

## 2015-07-17 NOTE — ED Notes (Signed)
Pt states that she began having pressure in her head last night at 1800. She states her pain is constantly there but worse within the last day.

## 2015-07-17 NOTE — ED Notes (Signed)
Pt reports pain in back of head and neck and tingling in upper left arm since 0730 this morning.  Reports left side of face feels swollen.  Pt says has been having vision problems and saw her eye doctor a week ago and was told her optic nerve in her left eye was swollen and he wanted to reevaluate her July 18.

## 2015-07-17 NOTE — Discharge Instructions (Signed)
The CT scan of your head and neck are negative. The headache on seems to favor more of a muscle contraction type headache. The shadow that was seen in your chest appears to be a probable congenital on palpation (a birth related incident). Please have Dr. Sherwood GamblerFusco to follow this closely. Tension Headache A tension headache is pain, pressure, or aching that is felt over the front and sides of your head. These headaches can last from 30 minutes to several days. HOME CARE Managing Pain  Take over-the-counter and prescription medicines only as told by your doctor.  Lie down in a dark, quiet room when you have a headache.  If directed, apply ice to your head and neck area:  Put ice in a plastic bag.  Place a towel between your skin and the bag.  Leave the ice on for 20 minutes, 2-3 times per day.  Use a heating pad or a hot shower to apply heat to your head and neck area as told by your doctor. Eating and Drinking  Eat meals on a regular schedule.  Do not drink a lot of alcohol.  Do not use a lot of caffeine, or stop using caffeine. General Instructions  Keep all follow-up visits as told by your doctor. This is important.  Keep a journal to find out if certain things bring on headaches. For example, write down:  What you eat and drink.  How much sleep you get.  Any change to your diet or medicines.  Try getting a massage, or doing other things that help you to relax.  Lessen stress.  Sit up straight. Do not tighten (tense) your muscles.  Do not use tobacco products. This includes cigarettes, chewing tobacco, or e-cigarettes. If you need help quitting, ask your doctor.  Exercise regularly as told by your doctor.  Get enough sleep. This may mean 7-9 hours of sleep. GET HELP IF:  Your symptoms are not helped by medicine.  You have a headache that feels different from your usual headache.  You feel sick to your stomach (nauseous) or you throw up (vomit).  You have a  fever. GET HELP RIGHT AWAY IF:  Your headache becomes very bad.  You keep throwing up.  You have a stiff neck.  You have trouble seeing.  You have trouble speaking.  You have pain in your eye or ear.  Your muscles are weak or you lose muscle control.  You lose your balance or you have trouble walking.  You feel like you will pass out (faint) or you pass out.  You have confusion.   This information is not intended to replace advice given to you by your health care provider. Make sure you discuss any questions you have with your health care provider.   Document Released: 04/14/2009 Document Revised: 10/09/2014 Document Reviewed: 05/13/2014 Elsevier Interactive Patient Education [employer]

## 2015-07-17 NOTE — ED Provider Notes (Signed)
CSN: [id number]     Arrival date & time 07/17/15  4098 History   First MD Initiated Contact with Patient 07/17/15 0820     Chief Complaint  Patient presents with  . Headache     (Consider location/radiation/quality/duration/timing/severity/associated sxs/prior Treatment) HPI Comments: Patient has a history of headache. She has had a similar headache to the one she presents with today. She states however that usually the headache goes away with Tylenol or ibuprofen. This one has not been going away. The patient is also concerned because she was seen by her eye specialist recently, and was told that she had some problem, and/or swelling around her optic nerve, and she has had some visual changes that she thinks may be related to this. No changes in speech, no changes in balance, no changes in gait and no unusual weakness.  Patient is a 30 y.o. female presenting with headaches. The history is provided by the patient.  Headache Pain location:  L parietal Quality:  Sharp Radiates to:  L neck, L shoulder and L arm Severity currently:  7/10 Onset quality:  Gradual Duration:  14 hours Timing:  Intermittent Progression:  Worsening Chronicity: acute on chronic. Similar to prior headaches: yes   Context: not activity, not exposure to bright light, not loud noise and not straining   Relieved by:  Nothing Worsened by:  Nothing Ineffective treatments:  None tried Associated symptoms: blurred vision, neck pain and paresthesias   Associated symptoms: no abdominal pain, no diarrhea, no dizziness, no eye pain, no facial pain, no fever, no loss of balance, no nausea, no near-syncope, no photophobia, no syncope, no vomiting and no weakness     Past Medical History  Diagnosis Date  . Meningitis spinal     30 years old  . Normal delivery 02/14/2012   Past Surgical History  Procedure Laterality Date  . Tonsillectomy    . Adnoidectomy     Family History  Problem Relation Age of Onset  . Hepatitis  Mother   . Diabetes Mother     borderline  . Heart disease Mother   . Depression Mother   . Hyperlipidemia Father   . AAA (abdominal aortic aneurysm) Paternal Grandfather    Social History  Substance Use Topics  . Smoking status: Current Every Day Smoker -- 0.25 packs/day  . Smokeless tobacco: Never Used  . Alcohol Use: No   OB History    Gravida Para Term Preterm AB TAB SAB Ectopic Multiple Living   0 2 0 2 0 0 1     Review of Systems  Constitutional: Negative for fever.  Eyes: Positive for blurred vision. Negative for photophobia and pain.  Cardiovascular: Negative for syncope and near-syncope.  Gastrointestinal: Negative for nausea, vomiting, abdominal pain and diarrhea.  Musculoskeletal: Positive for neck pain.  Neurological: Positive for headaches and paresthesias. Negative for dizziness, weakness and loss of balance.  All other systems reviewed and are negative.     Allergies  Amoxicillin and Latex  Home Medications   Prior to Admission medications   Medication Sig Start Date End Date Taking? Authorizing Provider  clindamycin (CLEOCIN) 150 MG capsule Take 2 capsules (300 mg total) by mouth 3 (three) times daily. 11/22/14   Hope Orlene Och, NP  HYDROcodone-acetaminophen (NORCO/VICODIN) 5-325 MG tablet Take 2 tablets by mouth every 4 (four) hours as needed. Patient not taking: Reported on 01/17/2015 11/22/14   Janne Napoleon, NP  ibuprofen (ADVIL,MOTRIN) 600 MG tablet Take 1 tablet (  600 mg total) by mouth every 6 (six) hours. 02/16/12   Arabella Merles, CNM  meclizine (ANTIVERT) 25 MG tablet Take 1 tablet (25 mg total) by mouth 3 (three) times daily as needed for dizziness. 01/17/15   Vanetta Mulders, MD   BP 149/95 mmHg  Pulse 104  Temp(Src) 98.1 F (36.7 C) (Oral)  Resp 20  Ht 5\' 2"  (1.575 m)  Wt 88.451 kg  BMI 35.66 kg/m2  SpO2 100%  LMP 06/26/2015 Physical Exam  Constitutional: She is oriented to person, place, and time. She appears well-developed and  well-nourished.  Non-toxic appearance.  HENT:  Head: Normocephalic.  Right Ear: Tympanic membrane and external ear normal.  Left Ear: Tympanic membrane and external ear normal.  Eyes: EOM and lids are normal. Pupils are equal, round, and reactive to light.  Difficulty evaluation the optic disc. No hemorrhage. No exudate on ophthalmoscope exam. The pupils are equal and reactive to light. The lids are symmetrical. The anterior chamber is clear bilaterally. The conjunctiva is clear bilaterally. The extraocular movements are intact.  Neck: Normal range of motion. Neck supple. Carotid bruit is not present.  No carotid bruits appreciated.  Cardiovascular: Normal rate, regular rhythm, normal heart sounds, intact distal pulses and normal pulses.  Exam reveals no gallop and no friction rub.   No murmur heard. Pulmonary/Chest: Breath sounds normal. No respiratory distress.  Abdominal: Soft. Bowel sounds are normal. There is no tenderness. There is no guarding.  Musculoskeletal: Normal range of motion. She exhibits no edema or tenderness.  Lymphadenopathy:       Head (right side): No submandibular adenopathy present.       Head (left side): No submandibular adenopathy present.    She has no cervical adenopathy.  Neurological: She is alert and oriented to person, place, and time. She has normal strength. No cranial nerve deficit or sensory deficit.  The patient is awake and alert. Oriented to person place on and situation. The patient is ambulatory to her room without problem. Facial sensation is intact bilaterally. The patient elevates the uvula on without problem. Shoulder shrug are symmetrical. Tongue is in the midline. There no motor deficits of the upper or lower extremities. Coordination appears to be intact.  Skin: Skin is warm and dry.  Psychiatric: She has a normal mood and affect. Her speech is normal.  Nursing note and vitals reviewed.   ED Course  Procedures (including critical care  time) Labs Review Labs Reviewed  CBC WITH DIFFERENTIAL/PLATELET  URINALYSIS, ROUTINE W REFLEX MICROSCOPIC (NOT AT [facility])  BASIC METABOLIC PANEL  POC URINE PREG, ED    Imaging Review No results found. I have personally reviewed and evaluated these images and lab results as part of my medical decision-making.   EKG Interpretation None      MDM  omplete blood count well within normal limits. Basic metabolic panel also within normal limits.Urine pregnancy is negative. Urinalysis is negative for acute problem.  CT scan of the head reveals no acute intracranial abnormality. CT scan of the cervical spine is negative for fracture or dislocation. There is some loss of the lordotic curve. It is felt that this may be related to muscle spasm. There is also on noted a low-density masslike structure in the left frontal portion of the upper sternum. CT scan of the chest was suggested.  CT scan of the chest returns showing abnormal soft tissue, consistent with a pliable mass extending from the left superior mediastinum just below the thoracic inlet to  the superficial AP window region adjacent to the main and left pulmonary arteries. It is felt that this mass may be related to a congenital mass and is consistent with a Forgut Duplication Cyst. I discussed the findings with the patient in terms which he understands. I've instructed the patient to have her primary physician to follow this tissue mass closely as an outpatient. Questions were answered. The findings Pronation were presented again again. The patient states that she now has no further questions. Patient is to return immediately if any changes, problems, or concerns.    Final diagnoses:  Muscle tension headache  Congenital bronchopulmonary foregut malformation    *I have reviewed nursing notes, vital signs, and all appropriate lab and imaging results for this patient.**    Joanna QualeHobson Kierston Plasencia, PA-C 07/17/15 2238  Raeford RazorStephen Kohut, MD 07/29/15  1120

## 2015-07-31 ENCOUNTER — Emergency Department (HOSPITAL_COMMUNITY): Payer: Medicaid Other

## 2015-07-31 ENCOUNTER — Emergency Department (HOSPITAL_COMMUNITY)
Admission: EM | Admit: 2015-07-31 | Discharge: 2015-08-01 | Disposition: A | Discharge status: Home / Self Care | Payer: Medicaid Other | Attending: Emergency Medicine | Admitting: Emergency Medicine

## 2015-07-31 ENCOUNTER — Encounter (HOSPITAL_COMMUNITY): Payer: Self-pay

## 2015-07-31 DIAGNOSIS — F17200 Nicotine dependence, unspecified, uncomplicated: Secondary | ICD-10-CM | POA: Insufficient documentation

## 2015-07-31 DIAGNOSIS — N63 Unspecified lump in breast: Secondary | ICD-10-CM | POA: Insufficient documentation

## 2015-07-31 DIAGNOSIS — Z5321 Procedure and treatment not carried out due to patient leaving prior to being seen by health care provider: Secondary | ICD-10-CM | POA: Insufficient documentation

## 2015-07-31 DIAGNOSIS — R0781 Pleurodynia: Secondary | ICD-10-CM | POA: Insufficient documentation

## 2015-07-31 MED ORDER — OXYCODONE-ACETAMINOPHEN 5-325 MG PO TABS
ORAL_TABLET | ORAL | Status: AC
  Filled 2015-07-31: qty 1

## 2015-07-31 MED ORDER — OXYCODONE-ACETAMINOPHEN 5-325 MG PO TABS
1.0000 | ORAL_TABLET | ORAL | Status: DC | PRN
  Administered 2015-07-31: 1 via ORAL

## 2015-07-31 NOTE — ED Notes (Signed)
Pt's name called to recheck vitals no answer 

## 2015-07-31 NOTE — ED Notes (Addendum)
Per Pt, Pt was diagnosed with a chest cyst one week ago with CT Chest. Pt reports feeling like her pain on the left side of her ribs has worsened. Pt denies chest pain, SOB, N/V/D, or dizziness. Reports cough with yellow sputum

## 2015-08-01 ENCOUNTER — Other Ambulatory Visit (HOSPITAL_COMMUNITY): Payer: Self-pay | Admitting: Family Medicine

## 2015-08-07 ENCOUNTER — Other Ambulatory Visit (HOSPITAL_COMMUNITY): Payer: Self-pay | Admitting: Family Medicine

## 2015-08-07 ENCOUNTER — Other Ambulatory Visit (HOSPITAL_COMMUNITY): Payer: Self-pay | Admitting: Pediatrics

## 2015-08-07 DIAGNOSIS — R222 Localized swelling, mass and lump, trunk: Secondary | ICD-10-CM

## 2015-08-07 DIAGNOSIS — Z6838 Body mass index (BMI) 38.0-38.9, adult: Secondary | ICD-10-CM

## 2015-08-07 DIAGNOSIS — Z1389 Encounter for screening for other disorder: Secondary | ICD-10-CM

## 2015-08-18 ENCOUNTER — Ambulatory Visit (HOSPITAL_COMMUNITY)
Admission: RE | Admit: 2015-08-18 | Discharge: 2015-08-18 | Disposition: A | Discharge status: Home / Self Care | Payer: Medicaid Other | Source: Ambulatory Visit | Attending: Family Medicine | Admitting: Family Medicine

## 2015-08-18 DIAGNOSIS — Z1389 Encounter for screening for other disorder: Secondary | ICD-10-CM

## 2015-08-18 DIAGNOSIS — Z6838 Body mass index (BMI) 38.0-38.9, adult: Secondary | ICD-10-CM

## 2015-08-18 DIAGNOSIS — R222 Localized swelling, mass and lump, trunk: Secondary | ICD-10-CM | POA: Diagnosis present

## 2015-08-18 MED ORDER — GADOBENATE DIMEGLUMINE 529 MG/ML IV SOLN
20.0000 mL | Freq: Once | INTRAVENOUS | Status: AC | PRN
  Administered 2015-08-18: 20 mL via INTRAVENOUS

## 2015-09-22 ENCOUNTER — Emergency Department (HOSPITAL_COMMUNITY)
Admission: EM | Admit: 2015-09-22 | Discharge: 2015-09-22 | Disposition: A | Discharge status: Home / Self Care | Payer: Managed Care, Other (non HMO) | Attending: Emergency Medicine | Admitting: Emergency Medicine

## 2015-09-22 ENCOUNTER — Encounter (HOSPITAL_COMMUNITY): Payer: Self-pay | Admitting: *Deleted

## 2015-09-22 DIAGNOSIS — Z791 Long term (current) use of non-steroidal anti-inflammatories (NSAID): Secondary | ICD-10-CM | POA: Insufficient documentation

## 2015-09-22 DIAGNOSIS — R222 Localized swelling, mass and lump, trunk: Secondary | ICD-10-CM | POA: Insufficient documentation

## 2015-09-22 DIAGNOSIS — F17200 Nicotine dependence, unspecified, uncomplicated: Secondary | ICD-10-CM | POA: Insufficient documentation

## 2015-09-22 NOTE — ED Provider Notes (Signed)
[facility]EM[facility]PT Provider Note   CSN: [id number] Arrival date & time: 09/22/15  0040     History   Chief Complaint Chief Complaint  Patient presents with  . Chest Pain    HPI Joanna [name] is a 30 y.o. female.  HPI patient reports about 2 months ago she was having dizziness and lightheadedness and headaches and she had a CT of her head and neck done in the ER. There is noted be a possible mass around her heart. Ultimately she needed glasses and her dizziness went away. She had a CT of her chest and about a month ago she had an MRI of her chest. 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 They recommended repeat MRI in 6-12 months. Patient reports she has been having swelling off and on for the past 2 weeks that last a few hours at a time. She states it's in her left upper chest, her left breast, and her left lower lateral chest. She denies any fever or changes in her skin, she denies shortness of breath or chest pain. She states she gets some discomfort in the swollen areas. It is relieved by using cold compresses but not Motrin. She reports she could not get in to her PCP so she came to the ED.   PCP dr Sherwood GamblerFusco  Past Medical History:  Diagnosis Date  . 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 Normal delivery 02/14/2012    Patient Active Problem List   Diagnosis Date Noted  . Normal delivery 02/14/2012    Past Surgical History:  Procedure Laterality Date  . Adnoidectomy    . TONSILLECTOMY      OB History    Gravida Para Term Preterm AB Living   3 1 1  0 2 1   SAB TAB Ectopic Multiple Live Births   2 0 0 0 1       Home Medications    Prior to Admission medications   Medication Sig Start Date End Date Taking? Authorizing Provider  ibuprofen (ADVIL,MOTRIN) 200 MG tablet Take 600 mg by mouth every 6 (six) hours as needed.    Historical  Provider, MD    Family History Family History  Problem Relation Age of Onset  . Hepatitis Mother   . Diabetes Mother     borderline  . Heart disease Mother   . Depression Mother   . Hyperlipidemia Father   . AAA (abdominal aortic aneurysm) Paternal Grandfather     Social History Social History  Substance Use Topics  . Smoking status: Current Every Day Smoker    Packs/day: 0.25  . Smokeless tobacco: Never Used  . 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    Physical Exam Updated Vital Signs BP 137/90 (BP Location: Right Arm)   Pulse 91   Temp 97.8 F (36.6 C) (Oral)   Resp 20   Ht 5\' 2"  (1.575 m)   Wt 195 lb (88.5 kg)   LMP 09/14/2015   SpO2 100%   BMI 35.67 kg/m   Vital signs normal    Physical Exam  Constitutional: She is oriented to person, place, and time. She appears well-developed and well-nourished.  Non-toxic appearance. She does not appear ill.  No distress.  HENT:  Head: Normocephalic and atraumatic.  Right Ear: External ear normal.  Left Ear: External ear normal.  Nose: Nose normal. No mucosal edema or rhinorrhea.  Mouth/Throat: Oropharynx is clear and moist and mucous membranes are normal. No dental abscesses or uvula swelling.  Eyes: Conjunctivae and EOM are normal. Pupils are equal, round, and reactive to light.  Neck: Normal range of motion and full passive range of motion without pain. Neck supple.  Cardiovascular: Normal rate.   Pulmonary/Chest: Effort normal. No respiratory distress. She has no rhonchi. She exhibits no crepitus.    There is no obvious swelling seen on gross visualization of her left upper chest or left lateral chest there is no obvious swelling of her left breast. When her breast was palpated there was some areas of thickening consistent with fibrous tissue. I do not feel a distinct mass. When I palpate her  left upper chest and compared to the right it feels very similar with may be some mild increased thickness of the subcutaneous tissue. There is no discrete masses felt. When I palpate her left lateral chest wall it feels the same as when I palpate her right lateral chest wall.  The areas she feels are swollen are noted  Abdominal: Normal appearance.  Musculoskeletal: Normal range of motion. She exhibits no edema or tenderness.  Moves all extremities well.   Neurological: She is alert and oriented to person, place, and time. She has normal strength. No cranial nerve deficit.  Skin: Skin is warm, dry and intact. No rash noted. No erythema. No pallor.  She has some tinea versicolor of her left chest otherwise her skin is not red, without warmth, lesions  Psychiatric: Her speech is normal and behavior is normal. Her mood appears anxious.  Nursing note and vitals reviewed.    ED Treatments / Results   Procedures Procedures (including critical care time)    Initial Impression / Assessment and Plan / ED Course  I have reviewed the triage vital signs and the nursing notes.  Pertinent labs & imaging results that were available during my care of the patient were reviewed by me and considered in my medical decision making (see chart for details).  Clinical Course   Discussed that I did not find anything that I felt like I could treat tonight. I would be glad to refer her to a cardiothoracic surgeon for second opinion about her MRI and CT scan. At that point patient reported her main concern is if this is a immediately life-threatening problem, she states she lives home alone with her child. She was reassured this was not life-threatening.   Final Clinical Impressions(s) / ED Diagnoses   Final diagnoses:  Mass in chest   Plan discharge  Devoria AlbeIva Tammela Bales, MD, Concha PyoFACEP    Cheikh Bramble, MD 09/22/15 [phone]

## 2015-09-22 NOTE — ED Triage Notes (Addendum)
Pt states she was diagnosed with a mass in her chest in July after an MRI. Pt is c/o chest pressure, bilateral shoulder swelling, and pain in her left breast (feelign like knots are in her breast), swelling in her left side and upper abdomen x 2 days.

## 2015-09-22 NOTE — Discharge Instructions (Signed)
Continue the ice packs for comfort. Call Dr Hendrickson's office to get an appointment to evaluate the mass seen on your CT and MRI.

## 2015-09-24 DIAGNOSIS — R519 Headache, unspecified: Secondary | ICD-10-CM | POA: Insufficient documentation

## 2015-09-24 DIAGNOSIS — R51 Headache: Secondary | ICD-10-CM

## 2015-09-30 DIAGNOSIS — Q341 Congenital cyst of mediastinum: Secondary | ICD-10-CM | POA: Insufficient documentation

## 2015-11-15 ENCOUNTER — Emergency Department (HOSPITAL_COMMUNITY)
Admission: EM | Admit: 2015-11-15 | Discharge: 2015-11-15 | Disposition: A | Discharge status: Home / Self Care | Payer: Managed Care, Other (non HMO) | Attending: Emergency Medicine | Admitting: Emergency Medicine

## 2015-11-15 ENCOUNTER — Encounter (HOSPITAL_COMMUNITY): Payer: Self-pay | Admitting: Emergency Medicine

## 2015-11-15 DIAGNOSIS — M940 Chondrocostal junction syndrome [Tietze]: Secondary | ICD-10-CM | POA: Insufficient documentation

## 2015-11-15 DIAGNOSIS — N644 Mastodynia: Secondary | ICD-10-CM

## 2015-11-15 DIAGNOSIS — F17200 Nicotine dependence, unspecified, uncomplicated: Secondary | ICD-10-CM | POA: Insufficient documentation

## 2015-11-15 DIAGNOSIS — K029 Dental caries, unspecified: Secondary | ICD-10-CM | POA: Insufficient documentation

## 2015-11-15 MED ORDER — ERYTHROMYCIN BASE 500 MG PO TABS: 500.0000 mg | ORAL_TABLET | Freq: Four times a day (QID) | ORAL | 0 refills | Status: DC

## 2015-11-15 MED ORDER — NAPROXEN 250 MG PO TABS: 500.0000 mg | ORAL_TABLET | Freq: Once | ORAL | Status: DC

## 2015-11-15 MED ORDER — NAPROXEN 250 MG PO TABS: ORAL_TABLET | ORAL | 0 refills | Status: DC

## 2015-11-15 NOTE — Discharge Instructions (Signed)
Keep your appointment with the dentist this week. Take the erythromycin for the dental infection causing the swollen lymph node in your right neck. Take the naproxen for your chest pain and breast pain. Please follow up with Dr Emelda FearFerguson about your breast pain.  Return to the ED if you get a fever, redness or swelling in your breasts.

## 2015-11-15 NOTE — ED Triage Notes (Signed)
Pt c/o right sided neck pain with swollen area, left shoulder pain and left breast pain. Pt states she cannot wear a bra due to pain.

## 2015-11-15 NOTE — ED Provider Notes (Signed)
[facility]Y DEPT Provider Note   CSN: [id number] Arrival date & time: 11/15/15  0316  Time seen 04:00 AM   History   Chief Complaint Chief Complaint  Patient presents with  . Neck Pain    HPI Joanna [name] is a 30 y.o. female.  HPI  Patient states she's been having soreness of her breasts, left worse than the right off and on for the past 2 weeks. She states she has had to wear a sports bra  because of comfort or actually discomfort with a regular bra. She states there is an area on her left areola that she stuck with a sterile needle a couple weeks ago and had some drainage, otherwise she has not had any drainage from her nipples. She states she feels like she gets swollen knots around her breast that come and go. She states when the breast pain gets bad it radiates up over her left shoulder and into her left posterior back. She states it feels like when he coughs a lot. She also states she has central chest pain gets worse then the pain in her breasts. She describes it as sharp. She denies any fever. She denies any breast-feeding. She states when her breasts feel full or swollen she feels weak. She denies any family history of breast problems. Patient also states she has an appointment later this coming week with a dentist to get 4 of her molars removed on the right. She started noticing a swollen lymph node in her right upper neck a couple days ago. She denies any sore throat. She states her right upper wisdom tooth broke off. Patient states she can't take penicillin and she feels like clindamycin doesn't work anymore.  PCP [facility] Dr Emelda Fear  Past Medical History:  Diagnosis Date  . 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 Normal delivery 02/14/2012    Patient Active Problem List   Diagnosis Date Noted  . Normal delivery 02/14/2012    Past Surgical History:  Procedure Laterality Date  . Adnoidectomy    . TONSILLECTOMY      OB History    Gravida Para Term  Preterm AB Living   3 1 1  0 2 1   SAB TAB Ectopic Multiple Live Births   2 0 0 0 1       Home Medications    Prior to Admission medications   Medication Sig Start Date End Date Taking? Authorizing Provider  erythromycin (E-MYCIN) 500 MG tablet Take 1 tablet (500 mg total) by mouth every 6 (six) hours. 11/15/15   Devoria Albe, MD  ibuprofen (ADVIL,MOTRIN) 200 MG tablet Take 600 mg by mouth every 6 (six) hours as needed.    Historical Provider, MD  naproxen (NAPROSYN) 250 MG tablet Take 1 po BID with food prn pain 11/15/15   Devoria Albe, MD    Family History Family History  Problem Relation Age of Onset  . Hepatitis Mother   . Diabetes Mother     borderline  . Heart disease Mother   . Depression Mother   . Hyperlipidemia Father   . AAA (abdominal aortic aneurysm) Paternal Grandfather     Social History Social History  Substance Use Topics  . Smoking status: Current Every Day Smoker    Packs/day: 0.25  . Smokeless tobacco: Never Used  . Alcohol use No  employed Smokes 2 cigs a day   Allergies   Amoxicillin and Latex   Review of Systems Review of  Systems  All other systems reviewed and are negative.    Physical Exam Updated Vital Signs BP 157/99   Pulse 97   Temp 97.9 F (36.6 C)   Resp 20   Ht 5\' 2"  (1.575 m)   Wt 204 lb (92.5 kg)   LMP 11/03/2015   SpO2 100%   BMI 37.31 kg/m   Vital signs normal    Physical Exam  Constitutional: She is oriented to person, place, and time. She appears well-developed and well-nourished.  Non-toxic appearance. She does not appear ill. No distress.  HENT:  Head: Normocephalic and atraumatic.  Right Ear: External ear normal.  Left Ear: External ear normal.  Nose: Nose normal. No mucosal edema or rhinorrhea.  Mouth/Throat: Oropharynx is clear and moist and mucous membranes are normal. No dental abscesses or uvula swelling.  #1 and #2 are rotted to the gumline. She has tenderness to percussion on #31 and 32. Patient has a  small lymph node in the anterior cervical triangle just underneath the mandible. There is no other notable swelling or redness to her neck or soft tissue of her neck.  Eyes: Conjunctivae and EOM are normal. Pupils are equal, round, and reactive to light.  Neck: Normal range of motion and full passive range of motion without pain. Neck supple.  Cardiovascular: Normal rate, regular rhythm and normal heart sounds.  Exam reveals no gallop and no friction rub.   No murmur heard. Pulmonary/Chest: Effort normal and breath sounds normal. No respiratory distress. She has no wheezes. She has no rhonchi. She has no rales. She exhibits no tenderness and no crepitus.    Her breasts were palpated. I do not feel any obvious masses. Her breasts are symmetrical. There is no redness or induration felt in her skin or subcutaneous tissues. She is noted to have a enlarged gland on the areola at about 3:00. She states that is the area that she stuck a needle in before. She also is very tender to palpation over her costochondral junctions bilaterally in the mid to lower sternum.  Abdominal: Soft. Normal appearance and bowel sounds are normal. She exhibits no distension. There is no tenderness. There is no rebound and no guarding.  Musculoskeletal: Normal range of motion. She exhibits no edema or tenderness.  Moves all extremities well.   Neurological: She is alert and oriented to person, place, and time. She has normal strength. No cranial nerve deficit.  Skin: Skin is warm, dry and intact. No rash noted. No erythema. No pallor.  Psychiatric: She has a normal mood and affect. Her speech is normal and behavior is normal. Her mood appears not anxious.  Nursing note and vitals reviewed.    ED Treatments / Results  Labs (all labs ordered are listed, but only abnormal results are displayed) Labs Reviewed - No data to display  EKG  EKG Interpretation  Date/Time:  Saturday November 15 2015 03:29:21 EDT Ventricular  Rate:  87 PR Interval:    QRS Duration: 96 QT Interval:  366 QTC Calculation: 441 R Axis:   59 Text Interpretation:  Sinus rhythm Normal ECG No significant change since last tracing 17 Jan 2015 Confirmed by Jorel Gravlin  MD-I, Damisha Wolff ([id number]) on 11/15/2015 3:35:56 AM       Radiology No results found.  Procedures Procedures (including critical care time)  Medications Ordered in ED Medications  naproxen (NAPROSYN) tablet 500 mg (not administered)     Initial Impression / Assessment and Plan / ED Course  I have reviewed  the triage vital signs and the nursing notes.  Pertinent labs & imaging results that were available during my care of the patient were reviewed by me and considered in my medical decision making (see chart for details).  Clinical Course   Pt was given naproxen for her pain in her breasts and her costochondritis. She has no evidence of a abscess or cellulitis of her breasts, she does have pain to palpation over her costochondral joints. She has no distinct masses in her breasts. She was advised to follow up with Dr Emelda FearFerguson, her GYN to get a mammogram and further evaluation of her breast pain and  her dentist this week to get her 4 molars pulled. She was placed on erythromycin since she states the clindamycin isn't helping anymore.  Final Clinical Impressions(s) / ED Diagnoses   Final diagnoses:  Dental caries  Costochondritis  Breast pain    New Prescriptions New Prescriptions   ERYTHROMYCIN (E-MYCIN) 500 MG TABLET    Take 1 tablet (500 mg total) by mouth every 6 (six) hours.   NAPROXEN (NAPROSYN) 250 MG TABLET    Take 1 po BID with food prn pain    Plan discharge  Devoria AlbeIva Barbette Mcglaun, MD, Concha PyoFACEP     Shaheem Pichon, MD 11/15/15 [phone]

## 2015-12-10 ENCOUNTER — Other Ambulatory Visit (HOSPITAL_COMMUNITY): Payer: Self-pay | Admitting: Registered Nurse

## 2015-12-10 DIAGNOSIS — N644 Mastodynia: Secondary | ICD-10-CM

## 2015-12-10 DIAGNOSIS — N6002 Solitary cyst of left breast: Secondary | ICD-10-CM

## 2015-12-12 ENCOUNTER — Other Ambulatory Visit (HOSPITAL_COMMUNITY): Payer: Self-pay | Admitting: Registered Nurse

## 2015-12-12 DIAGNOSIS — N644 Mastodynia: Secondary | ICD-10-CM

## 2015-12-12 DIAGNOSIS — N6002 Solitary cyst of left breast: Secondary | ICD-10-CM

## 2015-12-18 ENCOUNTER — Encounter (HOSPITAL_COMMUNITY): Payer: Self-pay | Admitting: Emergency Medicine

## 2015-12-18 ENCOUNTER — Emergency Department (HOSPITAL_COMMUNITY): Payer: Self-pay

## 2015-12-18 ENCOUNTER — Emergency Department (HOSPITAL_COMMUNITY)
Admission: EM | Admit: 2015-12-18 | Discharge: 2015-12-18 | Disposition: A | Discharge status: Home / Self Care | Payer: Self-pay | Attending: Emergency Medicine | Admitting: Emergency Medicine

## 2015-12-18 DIAGNOSIS — Z91040 Latex allergy status: Secondary | ICD-10-CM | POA: Insufficient documentation

## 2015-12-18 DIAGNOSIS — R1012 Left upper quadrant pain: Secondary | ICD-10-CM | POA: Insufficient documentation

## 2015-12-18 DIAGNOSIS — Z79899 Other long term (current) drug therapy: Secondary | ICD-10-CM | POA: Insufficient documentation

## 2015-12-18 DIAGNOSIS — R1010 Upper abdominal pain, unspecified: Secondary | ICD-10-CM

## 2015-12-18 DIAGNOSIS — F17200 Nicotine dependence, unspecified, uncomplicated: Secondary | ICD-10-CM | POA: Insufficient documentation

## 2015-12-18 DIAGNOSIS — Z791 Long term (current) use of non-steroidal anti-inflammatories (NSAID): Secondary | ICD-10-CM | POA: Insufficient documentation

## 2015-12-18 LAB — URINALYSIS, ROUTINE W REFLEX MICROSCOPIC
Bilirubin Urine: NEGATIVE
Glucose, UA: NEGATIVE mg/dL
Hgb urine dipstick: NEGATIVE
Leukocytes, UA: NEGATIVE
Nitrite: NEGATIVE
Specific Gravity, Urine: 1.025 (ref 1.005–1.030)
pH: 6 (ref 5.0–8.0)

## 2015-12-18 LAB — LIPASE, BLOOD: Lipase: 36 U/L (ref 11–51)

## 2015-12-18 LAB — CBC
HCT: 38.5 % (ref 36.0–46.0)
Hemoglobin: 12.9 g/dL (ref 12.0–15.0)
MCH: 29.4 pg (ref 26.0–34.0)
MCHC: 33.5 g/dL (ref 30.0–36.0)
MCV: 87.7 fL (ref 78.0–100.0)
Platelets: 222 10*3/uL (ref 150–400)
RBC: 4.39 MIL/uL (ref 3.87–5.11)
RDW: 13.4 % (ref 11.5–15.5)
WBC: 11.1 10*3/uL — ABNORMAL HIGH (ref 4.0–10.5)

## 2015-12-18 LAB — URINE MICROSCOPIC-ADD ON

## 2015-12-18 LAB — PREGNANCY, URINE: Preg Test, Ur: NEGATIVE

## 2015-12-18 LAB — COMPREHENSIVE METABOLIC PANEL
ALT: 20 U/L (ref 14–54)
AST: 23 U/L (ref 15–41)
Albumin: 4.2 g/dL (ref 3.5–5.0)
Alkaline Phosphatase: 61 U/L (ref 38–126)
Anion gap: 8 (ref 5–15)
BUN: 13 mg/dL (ref 6–20)
CO2: 22 mmol/L (ref 22–32)
Calcium: 9.2 mg/dL (ref 8.9–10.3)
Chloride: 107 mmol/L (ref 101–111)
Creatinine, Ser: 0.87 mg/dL (ref 0.44–1.00)
GFR calc Af Amer: >60 mL/min (ref >60)
GFR calc non Af Amer: >60 mL/min (ref >60)
Glucose, Bld: 95 mg/dL (ref 65–99)
Potassium: 3.4 mmol/L — ABNORMAL LOW (ref 3.5–5.1)
Sodium: 137 mmol/L (ref 135–145)
Total Bilirubin: 0.3 mg/dL (ref 0.3–1.2)
Total Protein: 7.8 g/dL (ref 6.5–8.1)

## 2015-12-18 MED ORDER — IOPAMIDOL (ISOVUE-300) INJECTION 61%
100.0000 mL | Freq: Once | INTRAVENOUS | Status: AC | PRN
  Administered 2015-12-18: 100 mL via INTRAVENOUS

## 2015-12-18 MED ORDER — PANTOPRAZOLE SODIUM 20 MG PO TBEC: 20.0000 mg | DELAYED_RELEASE_TABLET | Freq: Every day | ORAL | 0 refills | Status: DC

## 2015-12-18 MED ORDER — KETOROLAC TROMETHAMINE 30 MG/ML IJ SOLN
30.0000 mg | Freq: Once | INTRAMUSCULAR | Status: AC
  Administered 2015-12-18: 30 mg via INTRAVENOUS
  Filled 2015-12-18: qty 1

## 2015-12-18 MED ORDER — IOPAMIDOL (ISOVUE-300) INJECTION 61%
INTRAVENOUS | Status: AC
  Administered 2015-12-18: 30 mL
  Filled 2015-12-18: qty 30

## 2015-12-18 NOTE — ED Provider Notes (Signed)
A  [facility] DEPT Provider Note   CSN: [id number] Arrival date & time: 12/18/15  1240     History   Chief Complaint Chief Complaint  Patient presents with  . Abdominal Pain    HPI Joanna [name] is a 30 y.o. female.  Patient complains of left upper quadrant abdominal pain. No fever no chills   The history is provided by the patient. No language interpreter was used.  Abdominal Pain   This is a new problem. The current episode started more than 1 week ago. The problem occurs constantly. The problem has not changed since onset.The pain is associated with an unknown factor. The pain is located in the LUQ. The quality of the pain is aching. The pain is at a severity of 2/10. The pain is mild. Pertinent negatives include anorexia, diarrhea, frequency, hematuria and headaches.    Past Medical History:  Diagnosis Date  . 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 Normal delivery 02/14/2012    Patient Active Problem List   Diagnosis Date Noted  . Normal delivery 02/14/2012    Past Surgical History:  Procedure Laterality Date  . Adnoidectomy    . TONSILLECTOMY      OB History    Gravida Para Term Preterm AB Living   3 1 1  0 2 1   SAB TAB Ectopic Multiple Live Births   2 0 0 0 1       Home Medications    Prior to Admission medications   Medication Sig Start Date End Date Taking? Authorizing Provider  Cyanocobalamin (VITAMIN B-12 PO) Take 1 tablet by mouth daily.   Yes Historical Provider, MD  ibuprofen (ADVIL,MOTRIN) 200 MG tablet Take 600 mg by mouth every 6 (six) hours as needed.   Yes Historical Provider, MD  Multiple Vitamin (MULTIVITAMIN WITH MINERALS) TABS tablet Take 1 tablet by mouth daily.   Yes Historical Provider, MD  erythromycin (E-MYCIN) 500 MG tablet Take 1 tablet (500 mg total) by mouth every 6 (six) hours. Patient not taking: Reported on 12/18/2015 11/15/15   Devoria Albe, MD  naproxen (NAPROSYN) 250 MG tablet Take 1 po BID with food prn pain Patient  not taking: Reported on 12/18/2015 11/15/15   Devoria Albe, MD  pantoprazole (PROTONIX) 20 MG tablet Take 1 tablet (20 mg total) by mouth daily. 12/18/15   Bethann Berkshire, MD    Family History Family History  Problem Relation Age of Onset  . Hepatitis Mother   . Diabetes Mother     borderline  . Heart disease Mother   . Depression Mother   . Hyperlipidemia Father   . AAA (abdominal aortic aneurysm) Paternal Grandfather     Social History Social History  Substance Use Topics  . Smoking status: Current Every Day Smoker    Packs/day: 0.25  . Smokeless tobacco: Never Used  . Alcohol use No     Allergies   Amoxicillin and Latex   Review of Systems Review of Systems  Constitutional: Negative for appetite change and fatigue.  HENT: Negative for congestion, ear discharge and sinus pressure.   Eyes: Negative for discharge.  Respiratory: Negative for cough.   Cardiovascular: Negative for chest pain.  Gastrointestinal: Positive for abdominal pain. Negative for anorexia and diarrhea.  Genitourinary: Negative for frequency and hematuria.  Musculoskeletal: Negative for back pain.  Skin: Negative for rash.  Neurological: Negative for seizures and headaches.  Psychiatric/Behavioral: Negative for hallucinations.     Physical Exam Updated Vital Signs  BP 136/86 (BP Location: Right Arm)   Pulse 79   Temp 98 F (36.7 C) (Oral)   Resp 18   Ht 5\' 2"  (1.575 m)   Wt 195 lb (88.5 kg)   LMP 12/03/2015   SpO2 100%   BMI 35.67 kg/m   Physical Exam  Constitutional: She is oriented to person, place, and time. She appears well-developed.  HENT:  Head: Normocephalic.  Eyes: Conjunctivae and EOM are normal. No scleral icterus.  Neck: Neck supple. No thyromegaly present.  Cardiovascular: Normal rate and regular rhythm.  Exam reveals no gallop and no friction rub.   No murmur heard. Pulmonary/Chest: No stridor. She has no wheezes. She has no rales. She exhibits no tenderness.  Abdominal:  She exhibits no distension. There is tenderness. There is no rebound.  Minor left upper quadrant abdominal pain  Musculoskeletal: Normal range of motion. She exhibits no edema.  Lymphadenopathy:    She has no cervical adenopathy.  Neurological: She is oriented to person, place, and time. She exhibits normal muscle tone. Coordination normal.  Skin: No rash noted. No erythema.  Psychiatric: She has a normal mood and affect. Her behavior is normal.     ED Treatments / Results  Labs (all labs ordered are listed, but only abnormal results are displayed) Labs Reviewed  COMPREHENSIVE METABOLIC PANEL - Abnormal; Notable for the following:       Result Value   Potassium 3.4 (*)    All other components within normal limits  CBC - Abnormal; Notable for the following:    WBC 11.1 (*)    All other components within normal limits  URINALYSIS, ROUTINE W REFLEX MICROSCOPIC (NOT AT [facility]) - Abnormal; Notable for the following:    APPearance HAZY (*)    Ketones, ur TRACE (*)    Protein, ur TRACE (*)    All other components within normal limits  URINE MICROSCOPIC-ADD ON - Abnormal; Notable for the following:    Squamous Epithelial / LPF TOO NUMEROUS TO COUNT (*)    Bacteria, UA FEW (*)    Crystals CA OXALATE CRYSTALS (*)    All other components within normal limits  LIPASE, BLOOD  PREGNANCY, URINE    EKG  EKG Interpretation None       Radiology Ct Abdomen Pelvis W Contrast  Result Date: 12/18/2015 CLINICAL DATA:  Left-sided abdominal tenderness with left inguinal region swelling EXAM: CT ABDOMEN AND PELVIS WITH CONTRAST TECHNIQUE: Multidetector CT imaging of the abdomen and pelvis was performed using the standard protocol following bolus administration of intravenous contrast. Oral contrast was also administered. CONTRAST:  30mL ISOVUE-300 IOPAMIDOL (ISOVUE-300) INJECTION 61%, 100mL ISOVUE-300 IOPAMIDOL (ISOVUE-300) INJECTION 61% COMPARISON:  CT abdomen and pelvis November 27, 2007  FINDINGS: Lower chest: There is a stable 4 mm nodular opacity in the posterior segment of the right lower lobe abutting the diaphragm. Lung bases otherwise are clear. There is a focal hiatal hernia. Hepatobiliary: No focal liver lesions are evident. Gallbladder wall is not appreciably thickened. There is no biliary duct dilatation. Pancreas: There is no pancreatic mass or inflammatory focus. Spleen: No splenic lesions are evident. No perisplenic fluid. Spleen normal in size and contour. Adrenals/Urinary Tract: Adrenals appear normal bilaterally. Kidneys bilaterally show no evidence of mass or hydronephrosis on either side. There is no renal or ureteral calculus on either side. Urinary bladder is midline with wall thickness within normal limits. Stomach/Bowel: There is no bowel wall or mesenteric thickening. There is no bowel obstruction. No free air  or portal venous air is evident. There is moderate stool throughout the colon. Vascular/Lymphatic: There is no abdominal aortic aneurysm. Major mesenteric vessels appear patent. There are subcentimeter inguinal lymph nodes bilaterally which do not meet size criteria for pathologic significance. By size criteria, there is no evident adenopathy in the abdomen or pelvis. Reproductive: Uterus is anteverted. There are several cystic areas arising from the left ovary, largest measuring approximately 2 x 2 cm. No other pelvic masses are evident. Other: The appendix appears normal. There is no ascites or abscess in the abdomen or pelvis. There is a small ventral hernia containing only fat. Musculoskeletal: There are no blastic or lytic bone lesions. 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 IMPRESSION: There are several cystic areas arising from the left ovary, likely dominant follicles. It may be prudent to correlate with pelvic ultrasound, particularly with associated Doppler evaluation. Several cystic structures arising  from the ovary in this manner potentially place this patient at increased risk for ovarian torsion. Small inguinal lymph nodes which do not meet size criteria for pathologic significance. No adenopathy in the abdomen or pelvis by size criteria. Spleen normal in size and contour. No bowel obstruction. No abscess. Appendix appears normal. No renal or ureteral calculus. No hydronephrosis. Small ventral hernia containing only fat. 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 Small nodular lesion right lung base, stable from prior study and felt to be benign. Electronically Signed   By: Bretta BangWilliam  Woodruff III M.D.   On: 12/18/2015 16:00    Procedures Procedures (including critical care time)  Medications Ordered in ED Medications  iopamidol (ISOVUE-300) 61 % injection (30 mLs  Contrast Given 12/18/15 1417)  ketorolac (TORADOL) 30 MG/ML injection 30 mg (30 mg Intravenous Given 12/18/15 1456)  iopamidol (ISOVUE-300) 61 % injection 100 mL (100 mLs Intravenous Contrast Given 12/18/15 1535)     Initial Impression / Assessment and Plan / ED Course  I have reviewed the triage vital signs and the nursing notes.  Pertinent labs & imaging results that were available during my care of the patient were reviewed by me and considered in my medical decision making (see chart for details).  Clinical Course     CT scan of the abdomen unremarkable except for hiatal hernia. Patient will be started on protonic and will follow-up with her PCP  Final Clinical Impressions(s) / ED Diagnoses   Final diagnoses:  Pain of upper abdomen    New Prescriptions New Prescriptions   PANTOPRAZOLE (PROTONIX) 20 MG TABLET    Take 1 tablet (20 mg total) by mouth daily.     Bethann BerkshireJoseph Marieke Lubke, MD 12/18/15 [phone]

## 2015-12-18 NOTE — ED Triage Notes (Signed)
PT c/o LUQ abdominal tenderness with lymph node swelling to left sided groin, and submandibular lymph node swelling worsening the past week. PT states she had recent MRI to rule out lymphoma at [facility].

## 2015-12-18 NOTE — Discharge Instructions (Signed)
Follow up with your md in 1-2 weeks for recheck °

## 2016-01-14 ENCOUNTER — Emergency Department (HOSPITAL_COMMUNITY)
Admission: EM | Admit: 2016-01-14 | Discharge: 2016-01-15 | Disposition: A | Discharge status: Home / Self Care | Payer: Medicaid Other | Attending: Emergency Medicine | Admitting: Emergency Medicine

## 2016-01-14 ENCOUNTER — Encounter (HOSPITAL_COMMUNITY): Payer: Self-pay | Admitting: Emergency Medicine

## 2016-01-14 ENCOUNTER — Emergency Department (HOSPITAL_COMMUNITY): Payer: Medicaid Other

## 2016-01-14 DIAGNOSIS — R0789 Other chest pain: Secondary | ICD-10-CM

## 2016-01-14 DIAGNOSIS — F419 Anxiety disorder, unspecified: Secondary | ICD-10-CM

## 2016-01-14 DIAGNOSIS — Z91040 Latex allergy status: Secondary | ICD-10-CM | POA: Insufficient documentation

## 2016-01-14 DIAGNOSIS — F17200 Nicotine dependence, unspecified, uncomplicated: Secondary | ICD-10-CM | POA: Insufficient documentation

## 2016-01-14 HISTORY — DX: Other specified congenital malformations of heart: Q24.8

## 2016-01-14 LAB — CBC
HCT: 38.5 % (ref 36.0–46.0)
Hemoglobin: 13.2 g/dL (ref 12.0–15.0)
MCH: 29.8 pg (ref 26.0–34.0)
MCHC: 34.3 g/dL (ref 30.0–36.0)
MCV: 86.9 fL (ref 78.0–100.0)
Platelets: 232 10*3/uL (ref 150–400)
RBC: 4.43 MIL/uL (ref 3.87–5.11)
RDW: 13.2 % (ref 11.5–15.5)
WBC: 12 10*3/uL — ABNORMAL HIGH (ref 4.0–10.5)

## 2016-01-14 LAB — BASIC METABOLIC PANEL
Anion gap: 11 (ref 5–15)
BUN: 11 mg/dL (ref 6–20)
CO2: 23 mmol/L (ref 22–32)
Calcium: 10.3 mg/dL (ref 8.9–10.3)
Chloride: 106 mmol/L (ref 101–111)
Creatinine, Ser: 0.96 mg/dL (ref 0.44–1.00)
GFR calc Af Amer: >60 mL/min (ref >60)
GFR calc non Af Amer: >60 mL/min (ref >60)
Glucose, Bld: 91 mg/dL (ref 65–99)
Potassium: 3.7 mmol/L (ref 3.5–5.1)
Sodium: 140 mmol/L (ref 135–145)

## 2016-01-14 LAB — I-STAT TROPONIN, ED: Troponin i, poc: 0 ng/mL (ref 0.00–0.08)

## 2016-01-14 NOTE — ED Triage Notes (Signed)
Patient reports intermittent central chest pain with SOB and emesis x1 today , denies cough or diaphoresis .

## 2016-01-15 NOTE — Discharge Instructions (Signed)
Please make sure to [facility] to evaluate your perihilar cyst at this time.  There is no change in your physical condition.  Reproducible chest wall pain is crampy, easily treated with Tylenol, ibuprofen, warm compresses.  Return anytime you're concerned about your physical condition or you develop new or worsening symptoms

## 2016-01-15 NOTE — ED Provider Notes (Signed)
[facility] DEPT Provider Note   CSN: [id number] Arrival date & time: 01/14/16  2049     History   Chief Complaint Chief Complaint  Patient presents with  . Chest Pain    HPI Joanna [name] is a 30 y.o. female.  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  She is [facility] who presents today with tenderness to the left anterior chest wall and concerned for 2 "hard areas" in her lower abdomen.  Denies any shortness of breath, tachycardia, nausea, vomiting, URI symptoms, cough, peripheral edema.      Past Medical History:  Diagnosis Date  . 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 Normal delivery 02/14/2012  . Pericardial cyst     Patient Active Problem List   Diagnosis Date Noted  . Normal delivery 02/14/2012    Past Surgical History:  Procedure Laterality Date  . Adnoidectomy    . TONSILLECTOMY      OB History    Gravida Para Term Preterm AB Living   3 1 1  0 2 1   SAB TAB Ectopic Multiple Live Births   2 0 0 0 1       Home Medications    Prior to Admission medications   Medication Sig Start Date End Date Taking? Authorizing Provider  Cyanocobalamin (VITAMIN B-12 PO) Take 1 tablet by mouth daily.    Historical Provider, MD  erythromycin (E-MYCIN) 500 MG tablet Take 1 tablet (500 mg total) by mouth every 6 (six) hours. Patient not taking: Reported on 12/18/2015 11/15/15   Devoria AlbeIva Knapp, MD  ibuprofen (ADVIL,MOTRIN) 200 MG tablet Take 600 mg by mouth every 6 (six) hours as needed.    Historical Provider, MD  Multiple Vitamin (MULTIVITAMIN WITH MINERALS) TABS tablet Take 1 tablet by mouth daily.    Historical Provider, MD  naproxen (NAPROSYN) 250 MG tablet Take 1 po BID with food prn pain Patient not taking: Reported on 12/18/2015 11/15/15   Devoria AlbeIva Knapp, MD  pantoprazole (PROTONIX) 20 MG tablet Take 1 tablet (20 mg total) by mouth daily. 12/18/15   Bethann BerkshireJoseph Zammit, MD     Family History Family History  Problem Relation Age of Onset  . Hepatitis Mother   . Diabetes Mother     borderline  . Heart disease Mother   . Depression Mother   . Hyperlipidemia Father   . AAA (abdominal aortic aneurysm) Paternal Grandfather     Social History Social History  Substance Use Topics  . Smoking status: Current Every Day Smoker    Packs/day: 0.25  . Smokeless tobacco: Never Used  . Alcohol use No     Allergies   Amoxicillin and Latex   Review of Systems Review of Systems  Constitutional: Negative for chills and fever.  HENT: Negative for congestion.   Cardiovascular: Positive for chest pain. Negative for palpitations and leg swelling.  Psychiatric/Behavioral: The patient is nervous/anxious.   All other systems reviewed and are negative.    Physical Exam Updated Vital Signs BP 126/78   Pulse 78   Temp 98.3 F (36.8 C) (Oral)   Resp 18   Ht 5\' 2"  (1.575 m)   Wt 93 kg   LMP 01/10/2016   SpO2 97%   BMI 37.49 kg/m   Physical Exam  Constitutional: She is oriented to person, place, and time. She appears well-developed and well-nourished.  HENT:  Head: Normocephalic.  Eyes:  Pupils are equal, round, and reactive to light.  Cardiovascular: Normal rate.   Pulmonary/Chest: Effort normal. She exhibits tenderness.  Abdominal: Soft. Bowel sounds are normal.  Musculoskeletal: Normal range of motion.  Neurological: She is alert and oriented to person, place, and time.  Skin: Skin is warm and dry. No rash noted.  Psychiatric: She has a normal mood and affect.  Nursing note and vitals reviewed.    ED Treatments / Results  Labs (all labs ordered are listed, but only abnormal results are displayed) Labs Reviewed  CBC - Abnormal; Notable for the following:       Result Value   WBC 12.0 (*)    All other components within normal limits  BASIC METABOLIC PANEL  I-STAT TROPOININ, ED    EKG  EKG Interpretation None       Radiology Dg  Chest 2 View  Result Date: 01/14/2016 CLINICAL DATA:  Initial evaluation for acute central chest pain, shortness of breath. EXAM: CHEST  2 VIEW COMPARISON:  Prior radiograph from 07/31/2015. FINDINGS: The cardiac and mediastinal silhouettes are stable in size and contour, and remain within normal limits. The lungs are normally inflated. Mild bibasilar atelectasis. No airspace consolidation, pleural effusion, or pulmonary edema is identified. There is no pneumothorax. No acute osseous abnormality identified. IMPRESSION: Mild bibasilar subsegmental atelectasis. No other active cardiopulmonary disease identified. Electronically Signed   By: Rise MuBenjamin  McClintock M.D.   On: 01/14/2016 21:34    Procedures Procedures (including critical care time)  Medications Ordered in ED Medications - No data to display   Initial Impression / Assessment and Plan / ED Course  I have reviewed the triage vital signs and the nursing notes.  Pertinent labs & imaging results that were available during my care of the patient were reviewed by me and considered in my medical decision making (see chart for details).  Clinical Course      Labs and x-ray reviewed.  Patient has been reassured that this is not lymphoma.  She agrees that she can [facility] in February  Final Clinical Impressions(s) / ED Diagnoses   Final diagnoses:  Chest wall pain  Anxiety    New Prescriptions New Prescriptions   No medications on file     Earley FavorGail Daeshawn Redmann, NP 01/15/16 0036    Earley FavorGail Keia Rask, NP 01/15/16 [id number]    Canary Brimhristopher J Tegeler, MD 01/15/16 1410

## 2016-04-20 ENCOUNTER — Other Ambulatory Visit (HOSPITAL_COMMUNITY): Payer: Self-pay | Admitting: Registered Nurse

## 2016-04-21 ENCOUNTER — Other Ambulatory Visit (HOSPITAL_COMMUNITY): Payer: Self-pay | Admitting: Registered Nurse

## 2016-04-21 DIAGNOSIS — R079 Chest pain, unspecified: Secondary | ICD-10-CM

## 2016-04-26 ENCOUNTER — Ambulatory Visit (HOSPITAL_COMMUNITY)
Admission: RE | Admit: 2016-04-26 | Discharge: 2016-04-26 | Disposition: A | Discharge status: Home / Self Care | Payer: PRIVATE HEALTH INSURANCE | Source: Ambulatory Visit | Attending: Registered Nurse | Admitting: Registered Nurse

## 2016-04-26 ENCOUNTER — Other Ambulatory Visit: Payer: Self-pay | Admitting: Surgery

## 2016-04-26 DIAGNOSIS — R079 Chest pain, unspecified: Secondary | ICD-10-CM | POA: Insufficient documentation

## 2016-04-26 DIAGNOSIS — K760 Fatty (change of) liver, not elsewhere classified: Secondary | ICD-10-CM | POA: Diagnosis not present

## 2016-04-26 DIAGNOSIS — R600 Localized edema: Secondary | ICD-10-CM

## 2016-04-26 MED ORDER — GADOBENATE DIMEGLUMINE 529 MG/ML IV SOLN
15.0000 mL | Freq: Once | INTRAVENOUS | Status: AC | PRN
  Administered 2016-04-26: 15 mL via INTRAVENOUS

## 2016-04-27 ENCOUNTER — Ambulatory Visit (HOSPITAL_COMMUNITY): Admission: RE | Admit: 2016-04-27 | Payer: PRIVATE HEALTH INSURANCE | Source: Ambulatory Visit

## 2016-05-21 ENCOUNTER — Encounter (HOSPITAL_COMMUNITY): Payer: Self-pay

## 2016-05-21 ENCOUNTER — Emergency Department (HOSPITAL_COMMUNITY)
Admission: EM | Admit: 2016-05-21 | Discharge: 2016-05-21 | Disposition: A | Discharge status: Home / Self Care | Payer: PRIVATE HEALTH INSURANCE | Attending: Emergency Medicine | Admitting: Emergency Medicine

## 2016-05-21 DIAGNOSIS — R0789 Other chest pain: Secondary | ICD-10-CM | POA: Insufficient documentation

## 2016-05-21 DIAGNOSIS — R200 Anesthesia of skin: Secondary | ICD-10-CM | POA: Insufficient documentation

## 2016-05-21 DIAGNOSIS — Z87891 Personal history of nicotine dependence: Secondary | ICD-10-CM | POA: Insufficient documentation

## 2016-05-21 MED ORDER — HYDROCODONE-ACETAMINOPHEN 5-325 MG PO TABS
1.0000 | ORAL_TABLET | Freq: Once | ORAL | Status: AC
  Administered 2016-05-21: 1 via ORAL
  Filled 2016-05-21: qty 1

## 2016-05-21 NOTE — ED Provider Notes (Signed)
[facility]-EMERGENCY DEPT Provider Note   CSN: [id number] Arrival date & time: 05/21/16  7829     History   Chief Complaint Chief Complaint  Patient presents with  . Chest Pain    HPI Joanna [name] is a 31 y.o. female.  The history is provided by the patient.  Chest Pain   This is a recurrent problem. The current episode started more than 1 week ago. The problem has been gradually worsening. Pain location: left chest, lateral left chest. The pain is moderate. The quality of the pain is described as pressure-like. Associated symptoms include numbness. Pertinent negatives include no fever, no shortness of breath, no syncope and no vomiting. Treatments tried: ibuprofen. The treatment provided no relief.  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 Normal delivery 02/14/2012  . Pericardial cyst     Patient Active Problem List   Diagnosis Date Noted  . Normal delivery 02/14/2012    Past Surgical History:  Procedure Laterality Date  . Adnoidectomy    . TONSILLECTOMY      OB History    Gravida Para Term Preterm AB Living   0 2 1   SAB TAB Ectopic Multiple Live Births   2 0 0 0 1       Home Medications    Prior to Admission medications   Medication Sig Start Date End Date Taking? Authorizing Provider  Cyanocobalamin (VITAMIN B-12 PO) Take 1 tablet by mouth daily.   Yes Historical Provider, MD  ibuprofen (ADVIL,MOTRIN) 200 MG tablet Take 600 mg by mouth every 6 (six) hours as needed.   Yes Historical Provider, MD  erythromycin (E-MYCIN) 500 MG tablet Take  1 tablet (500 mg total) by mouth every 6 (six) hours. Patient not taking: Reported on 12/18/2015 11/15/15   Devoria Albe, MD  Multiple Vitamin (MULTIVITAMIN WITH MINERALS) TABS tablet Take 1 tablet by mouth daily.    Historical Provider, MD  naproxen (NAPROSYN) 250 MG tablet Take 1 po BID with food prn pain Patient not taking: Reported on 12/18/2015 11/15/15   Devoria Albe, MD  pantoprazole (PROTONIX) 20 MG tablet Take 1 tablet (20 mg total) by mouth daily. 12/18/15   Bethann Berkshire, MD    Family History Family History  Problem Relation Age of Onset  . Hepatitis Mother   . Diabetes Mother     borderline  . Heart disease Mother   . Depression Mother   . Hyperlipidemia Father   . AAA (abdominal aortic aneurysm) Paternal Grandfather     Social History Social History  Substance Use Topics  . Smoking status: Former Smoker    Packs/day: 0.25  . Smokeless tobacco: Never Used  . Alcohol use No     Allergies   Amoxicillin and Latex   Review of Systems Review of Systems  Constitutional: Negative for fever.  Respiratory: Negative for shortness of breath.   Cardiovascular: Positive for chest pain. Negative for syncope.  Gastrointestinal: Negative for vomiting.  Neurological: Positive for numbness.  All other systems reviewed and are negative.  Physical Exam Updated Vital Signs BP (!) 145/85 (BP Location: Left Arm)   Pulse 80   Temp 97.9 F (36.6 C) (Oral)   Resp 19   Ht  (1.575 m)   Wt 90.7 kg   LMP 05/16/2016 (Approximate)   SpO2 97%   BMI 36.58 kg/m   Physical Exam  CONSTITUTIONAL: Well developed/well nourished HEAD: Normocephalic/atraumatic EYES: EOMI/PERRL ENMT: Mucous membranes moist NECK: supple no meningeal signs SPINE/BACK:entire spine nontender CV: S1/S2 noted, no murmurs/rubs/gallops noted Chest - tenderness to central chest as well as just under left breast No breast mass/erythema noted.  No skin color changes Female nurse Baird Lyons present for  exam LUNGS: Lungs are clear to auscultation bilaterally, no apparent distress ABDOMEN: soft, nontender, no rebound or guarding, bowel sounds noted throughout abdomen GU:no cva tenderness NEURO: Pt is awake/alert/appropriate, moves all extremitiesx4.  No facial droop.   EXTREMITIES: pulses normal/equal, full ROM, no LE edema/tenderness SKIN: warm, color normal PSYCH: no abnormalities of mood noted, alert and oriented to situation Lymph - no axillary/supraclavicular lymphadenopathy noted Small left anterior cervical node noted  ED Treatments / Results  Labs (all labs ordered are listed, but only abnormal results are displayed) Labs Reviewed - No data to display  EKG  EKG Interpretation  Date/Time:  Friday May 21 2016 05:28:22 EDT Ventricular Rate:  75 PR Interval:    QRS Duration: 91 QT Interval:  379 QTC Calculation: 424 R Axis:   49 Text Interpretation:  Sinus rhythm Borderline T wave abnormalities No significant change since last tracing Confirmed by Bebe Shaggy  MD, Kerby Borner ([id number]) on 05/21/2016 5:35:39 AM       Radiology No results found.  Procedures Procedures (including critical care time)  Medications Ordered in ED Medications - No data to display   Initial Impression / Assessment and Plan / ED Course  I have reviewed the triage vital signs and the nursing notes.      Pt with h/o CP episodes for over a year She has known pericardial cyst that has been followed as outpatient and recent MRI chest revealed that it has not worsened On my exam, her CP is reproducible on exam EKG unchanged I doubt ACS/PE/Dissection Reassurance given She is appropriate for d/c home   Final Clinical Impressions(s) / ED Diagnoses   Final diagnoses:  Chest wall pain    New Prescriptions New Prescriptions   No medications on file     Zadie Rhine, MD 05/21/16 6045

## 2016-05-21 NOTE — ED Triage Notes (Signed)
Pt reports pressure and tightness to left chest, and left arm onset last night

## 2016-05-21 NOTE — Discharge Instructions (Signed)

## 2016-06-09 ENCOUNTER — Encounter: Payer: Self-pay | Admitting: Surgery

## 2016-06-14 ENCOUNTER — Inpatient Hospital Stay (HOSPITAL_COMMUNITY): Admission: RE | Admit: 2016-06-14 | Payer: PRIVATE HEALTH INSURANCE | Source: Ambulatory Visit

## 2016-06-14 ENCOUNTER — Encounter: Payer: Self-pay | Admitting: Surgery

## 2016-08-04 ENCOUNTER — Emergency Department (HOSPITAL_COMMUNITY)
Admission: EM | Admit: 2016-08-04 | Discharge: 2016-08-04 | Disposition: A | Discharge status: Home / Self Care | Payer: Commercial Managed Care - PPO | Attending: Emergency Medicine | Admitting: Emergency Medicine

## 2016-08-04 ENCOUNTER — Emergency Department (HOSPITAL_COMMUNITY): Payer: Commercial Managed Care - PPO

## 2016-08-04 ENCOUNTER — Encounter (HOSPITAL_COMMUNITY): Payer: Self-pay | Admitting: *Deleted

## 2016-08-04 DIAGNOSIS — Z91040 Latex allergy status: Secondary | ICD-10-CM | POA: Insufficient documentation

## 2016-08-04 DIAGNOSIS — Z880 Allergy status to penicillin: Secondary | ICD-10-CM | POA: Insufficient documentation

## 2016-08-04 DIAGNOSIS — R55 Syncope and collapse: Secondary | ICD-10-CM

## 2016-08-04 LAB — CBC WITH DIFFERENTIAL/PLATELET
Basophils Absolute: 0 10*3/uL (ref 0.0–0.1)
Basophils Relative: 0 %
Eosinophils Absolute: 0.2 10*3/uL (ref 0.0–0.7)
Eosinophils Relative: 1 %
HCT: 37.3 % (ref 36.0–46.0)
Hemoglobin: 12.4 g/dL (ref 12.0–15.0)
Lymphocytes Relative: 25 %
Lymphs Abs: 3.1 10*3/uL (ref 0.7–4.0)
MCH: 28.8 pg (ref 26.0–34.0)
MCHC: 33.2 g/dL (ref 30.0–36.0)
MCV: 86.5 fL (ref 78.0–100.0)
Monocytes Absolute: 1.2 10*3/uL — ABNORMAL HIGH (ref 0.1–1.0)
Monocytes Relative: 10 %
Neutro Abs: 8 10*3/uL — ABNORMAL HIGH (ref 1.7–7.7)
Neutrophils Relative %: 64 %
Platelets: 214 10*3/uL (ref 150–400)
RBC: 4.31 MIL/uL (ref 3.87–5.11)
RDW: 13.7 % (ref 11.5–15.5)
WBC: 12.5 10*3/uL — ABNORMAL HIGH (ref 4.0–10.5)

## 2016-08-04 LAB — URINALYSIS, ROUTINE W REFLEX MICROSCOPIC
Bilirubin Urine: NEGATIVE
Glucose, UA: NEGATIVE mg/dL
Hgb urine dipstick: NEGATIVE
Ketones, ur: NEGATIVE mg/dL
Leukocytes, UA: NEGATIVE
Nitrite: NEGATIVE
Protein, ur: NEGATIVE mg/dL
Specific Gravity, Urine: 1.016 (ref 1.005–1.030)
pH: 5 (ref 5.0–8.0)

## 2016-08-04 LAB — COMPREHENSIVE METABOLIC PANEL
ALT: 18 U/L (ref 14–54)
AST: 22 U/L (ref 15–41)
Albumin: 3.9 g/dL (ref 3.5–5.0)
Alkaline Phosphatase: 66 U/L (ref 38–126)
Anion gap: 9 (ref 5–15)
BUN: 8 mg/dL (ref 6–20)
CO2: 19 mmol/L — ABNORMAL LOW (ref 22–32)
Calcium: 9.2 mg/dL (ref 8.9–10.3)
Chloride: 107 mmol/L (ref 101–111)
Creatinine, Ser: 0.7 mg/dL (ref 0.44–1.00)
GFR calc Af Amer: >60 mL/min (ref >60)
GFR calc non Af Amer: >60 mL/min (ref >60)
Glucose, Bld: 93 mg/dL (ref 65–99)
Potassium: 4.3 mmol/L (ref 3.5–5.1)
Sodium: 135 mmol/L (ref 135–145)
Total Bilirubin: 0.5 mg/dL (ref 0.3–1.2)
Total Protein: 7.4 g/dL (ref 6.5–8.1)

## 2016-08-04 LAB — I-STAT BETA HCG BLOOD, ED (MC, WL, AP ONLY): I-stat hCG, quantitative: <5 m[IU]/mL (ref <5)

## 2016-08-04 MED ORDER — AZITHROMYCIN 250 MG PO TABS: ORAL_TABLET | ORAL | 0 refills | Status: DC

## 2016-08-04 MED ORDER — SODIUM CHLORIDE 0.9 % IV BOLUS (SEPSIS)
500.0000 mL | Freq: Once | INTRAVENOUS | Status: AC
  Administered 2016-08-04: 500 mL via INTRAVENOUS

## 2016-08-04 MED ORDER — AZITHROMYCIN 250 MG PO TABS
500.0000 mg | ORAL_TABLET | Freq: Once | ORAL | Status: AC
  Administered 2016-08-04: 500 mg via ORAL

## 2016-08-04 MED ORDER — AZITHROMYCIN 250 MG PO TABS
ORAL_TABLET | ORAL | Status: AC
  Filled 2016-08-04: qty 2

## 2016-08-04 NOTE — Discharge Instructions (Signed)
Follow up with your md next week   drink plenty of fluids

## 2016-08-04 NOTE — ED Provider Notes (Signed)
[facility] Provider Note   CSN: [id number] Arrival date & time: 08/04/16  1509     History   Chief Complaint Chief Complaint  Patient presents with  . Loss of Consciousness    HPI Joanna [name] is a 31 y.o. female.  Pt complains that she had a syncopal episode today and also she's been complaining of soreness in her chest   The history is provided by the patient.  Loss of Consciousness   This is a new problem. The current episode started less than 1 hour ago. The problem occurs rarely. The problem has been resolved. She lost consciousness for a period of 1 to 5 minutes. Associated symptoms include dizziness and visual change. Pertinent negatives include abdominal pain, back pain, chest pain, congestion, headaches and seizures.    Past Medical History:  Diagnosis Date  . 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 Normal delivery 02/14/2012  . Pericardial cyst     Patient Active Problem List   Diagnosis Date Noted  . Normal delivery 02/14/2012    Past Surgical History:  Procedure Laterality Date  . Adnoidectomy    . TONSILLECTOMY      OB History    Gravida Para Term Preterm AB Living   3 1 1  0 2 1   SAB TAB Ectopic Multiple Live Births   2 0 0 0 1       Home Medications    Prior to Admission medications   Medication Sig Start Date End Date Taking? Authorizing Provider  acetaminophen (TYLENOL) 500 MG tablet Take 500 mg by mouth every 6 (six) hours as needed for mild pain.   Yes [provider]  ibuprofen (ADVIL,MOTRIN) 200 MG tablet Take 600 mg by mouth every 6 (six) hours as needed.   Yes [provider]  azithromycin (ZITHROMAX Z-PAK) 250 MG tablet 2 po day one, then 1 daily x 4 days 08/04/16   Bethann Berkshire, MD    Family History Family History  Problem Relation Age of Onset  . Hepatitis Mother   . Diabetes Mother        borderline  . Heart disease Mother   . Depression Mother   . Hyperlipidemia Father   . AAA (abdominal  aortic aneurysm) Paternal Grandfather     Social History Social History  Substance Use Topics  . Smoking status: Former Smoker    Packs/day: 0.25  . Smokeless tobacco: Never Used  . Alcohol use No     Allergies   Amoxicillin and Latex   Review of Systems Review of Systems  Constitutional: Negative for appetite change and fatigue.  HENT: Negative for congestion, ear discharge and sinus pressure.   Eyes: Negative for discharge.  Respiratory: Negative for cough.   Cardiovascular: Positive for syncope. Negative for chest pain.  Gastrointestinal: Negative for abdominal pain and diarrhea.  Genitourinary: Negative for frequency and hematuria.  Musculoskeletal: Negative for back pain.  Skin: Negative for rash.  Neurological: Positive for dizziness. Negative for seizures and headaches.  Psychiatric/Behavioral: Negative for hallucinations.     Physical Exam Updated Vital Signs BP 133/79   Pulse 68   Temp 97.6 F (36.4 C) (Oral)   Resp 20   Ht 5\' 2"  (1.575 m)   Wt 88.5 kg (195 lb)   LMP 07/28/2016 (Approximate)   SpO2 97%   BMI 35.67 kg/m   Physical Exam  Constitutional: She is oriented to person, place, and time. She appears well-developed.  HENT:  Head:  Normocephalic.  Eyes: Conjunctivae and EOM are normal. No scleral icterus.  Neck: Neck supple. No thyromegaly present.  Cardiovascular: Normal rate and regular rhythm.  Exam reveals no gallop and no friction rub.   No murmur heard. Pulmonary/Chest: No stridor. She has no wheezes. She has no rales. She exhibits no tenderness.  Abdominal: She exhibits no distension. There is no tenderness. There is no rebound.  Musculoskeletal: Normal range of motion. She exhibits no edema.  Lymphadenopathy:    She has no cervical adenopathy.  Neurological: She is oriented to person, place, and time. She exhibits normal muscle tone. Coordination normal.  Skin: No rash noted. No erythema.  Psychiatric: She has a normal mood and  affect. Her behavior is normal.     ED Treatments / Results  Labs (all labs ordered are listed, but only abnormal results are displayed) Labs Reviewed  CBC WITH DIFFERENTIAL/PLATELET - Abnormal; Notable for the following:       Result Value   WBC 12.5 (*)    Neutro Abs 8.0 (*)    Monocytes Absolute 1.2 (*)    All other components within normal limits  COMPREHENSIVE METABOLIC PANEL - Abnormal; Notable for the following:    CO2 19 (*)    All other components within normal limits  URINALYSIS, ROUTINE W REFLEX MICROSCOPIC  I-STAT BETA HCG BLOOD, ED ([facility], [facility], [facility] ONLY)    EKG  EKG Interpretation  Date/Time:  Wednesday August 04 2016 16:03:02 EDT Ventricular Rate:  79 PR Interval:    QRS Duration: 83 QT Interval:  382 QTC Calculation: 438 R Axis:   40 Text Interpretation:  Sinus rhythm Confirmed by Bethann BerkshireZammit, Kedarius Aloisi [phone]([id number]) on 08/04/2016 5:30:02 PM       Radiology Dg Chest 2 View  Result Date: 08/04/2016 CLINICAL DATA:  Weakness since this morning. EXAM: CHEST  2 VIEW COMPARISON:  01/14/2016 FINDINGS: Two views study shows patchy airspace disease left lung base, suspicious for pneumonia. Linear scarring left lower lung is stable. Right lung clear. The cardiopericardial silhouette is within normal limits for size. The visualized bony structures of the thorax are intact. Telemetry leads overlie the chest. IMPRESSION: Patchy airspace disease at the left base is suspicious for pneumonia. Electronically Signed   By: Kennith CenterEric  Mansell M.D.   On: 08/04/2016 16:33   Ct Head Wo Contrast  Result Date: 08/04/2016 CLINICAL DATA:  Weakness.  Syncopal episode. EXAM: CT HEAD WITHOUT CONTRAST TECHNIQUE: Contiguous axial images were obtained from the base of the skull through the vertex without intravenous contrast. COMPARISON:  07/17/2015 FINDINGS: Brain: No evidence of acute infarction, hemorrhage, hydrocephalus, extra-axial collection or mass lesion/mass effect. Vascular: Negative. Skull: Normal. Negative  for fracture or focal lesion. Sinuses/Orbits: Minimal retained mucus in the ethmoids and left sphenoid sinuses. Negative orbits. IMPRESSION: Negative head CT. Electronically Signed   By: Marnee SpringJonathon  Watts M.D.   On: 08/04/2016 16:42    Procedures Procedures (including critical care time)  Medications Ordered in ED Medications  sodium chloride 0.9 % bolus 500 mL (0 mLs Intravenous Stopped 08/04/16 1732)     Initial Impression / Assessment and Plan / ED Course  I have reviewed the triage vital signs and the nursing notes.  Pertinent labs & imaging results that were available during my care of the patient were reviewed by me and considered in my medical decision making (see chart for details).     Patient with syncope of unknown cause. Also patient possibly has pneumonia on chest x-ray she will be put on  Z-Pak will follow-up with her PCP  Final Clinical Impressions(s) / ED Diagnoses   Final diagnoses:  Near syncope    New Prescriptions New Prescriptions   AZITHROMYCIN (ZITHROMAX Z-PAK) 250 MG TABLET    2 po day one, then 1 daily x 4 days     Bethann Berkshire, MD 08/04/16 1739

## 2016-08-04 NOTE — ED Triage Notes (Signed)
Syncopal episode, swelling left side and extremities

## 2016-08-19 ENCOUNTER — Other Ambulatory Visit (HOSPITAL_COMMUNITY): Payer: Self-pay | Admitting: Registered Nurse

## 2016-08-19 DIAGNOSIS — N6009 Solitary cyst of unspecified breast: Secondary | ICD-10-CM

## 2017-04-04 ENCOUNTER — Encounter (HOSPITAL_COMMUNITY): Payer: Self-pay | Admitting: Emergency Medicine

## 2017-04-04 ENCOUNTER — Emergency Department (HOSPITAL_COMMUNITY): Payer: PRIVATE HEALTH INSURANCE

## 2017-04-04 ENCOUNTER — Emergency Department (HOSPITAL_COMMUNITY)
Admission: EM | Admit: 2017-04-04 | Discharge: 2017-04-04 | Disposition: A | Discharge status: Home / Self Care | Payer: PRIVATE HEALTH INSURANCE | Attending: Emergency Medicine | Admitting: Emergency Medicine

## 2017-04-04 ENCOUNTER — Other Ambulatory Visit: Payer: Self-pay

## 2017-04-04 DIAGNOSIS — Z79899 Other long term (current) drug therapy: Secondary | ICD-10-CM | POA: Diagnosis not present

## 2017-04-04 DIAGNOSIS — R590 Localized enlarged lymph nodes: Secondary | ICD-10-CM | POA: Diagnosis present

## 2017-04-04 DIAGNOSIS — R1010 Upper abdominal pain, unspecified: Secondary | ICD-10-CM

## 2017-04-04 DIAGNOSIS — F17210 Nicotine dependence, cigarettes, uncomplicated: Secondary | ICD-10-CM | POA: Diagnosis not present

## 2017-04-04 LAB — COMPREHENSIVE METABOLIC PANEL
ALT: 18 U/L (ref 14–54)
AST: 18 U/L (ref 15–41)
Albumin: 3.7 g/dL (ref 3.5–5.0)
Alkaline Phosphatase: 72 U/L (ref 38–126)
Anion gap: 10 (ref 5–15)
BUN: 13 mg/dL (ref 6–20)
CO2: 22 mmol/L (ref 22–32)
Calcium: 9.1 mg/dL (ref 8.9–10.3)
Chloride: 105 mmol/L (ref 101–111)
Creatinine, Ser: 0.74 mg/dL (ref 0.44–1.00)
GFR calc Af Amer: >60 mL/min (ref >60)
GFR calc non Af Amer: >60 mL/min (ref >60)
Glucose, Bld: 100 mg/dL — ABNORMAL HIGH (ref 65–99)
Potassium: 3.9 mmol/L (ref 3.5–5.1)
Sodium: 137 mmol/L (ref 135–145)
Total Bilirubin: 0.4 mg/dL (ref 0.3–1.2)
Total Protein: 6.8 g/dL (ref 6.5–8.1)

## 2017-04-04 LAB — CBC WITH DIFFERENTIAL/PLATELET
Basophils Absolute: 0 10*3/uL (ref 0.0–0.1)
Basophils Relative: 0 %
Eosinophils Absolute: 0.3 10*3/uL (ref 0.0–0.7)
Eosinophils Relative: 3 %
HCT: 35.3 % — ABNORMAL LOW (ref 36.0–46.0)
Hemoglobin: 11.8 g/dL — ABNORMAL LOW (ref 12.0–15.0)
Lymphocytes Relative: 23 %
Lymphs Abs: 2.4 10*3/uL (ref 0.7–4.0)
MCH: 29.6 pg (ref 26.0–34.0)
MCHC: 33.4 g/dL (ref 30.0–36.0)
MCV: 88.5 fL (ref 78.0–100.0)
Monocytes Absolute: 1 10*3/uL (ref 0.1–1.0)
Monocytes Relative: 10 %
Neutro Abs: 6.6 10*3/uL (ref 1.7–7.7)
Neutrophils Relative %: 64 %
Platelets: 202 10*3/uL (ref 150–400)
RBC: 3.99 MIL/uL (ref 3.87–5.11)
RDW: 13 % (ref 11.5–15.5)
WBC: 10.3 10*3/uL (ref 4.0–10.5)

## 2017-04-04 NOTE — ED Provider Notes (Signed)
[facility] EMERGENCY DEPARTMENT Provider Note   CSN: [id number] Arrival date & time: 04/04/17  2100     History   Chief Complaint Chief Complaint  Patient presents with  . Lymphadenopathy    HPI Joanna [name] is a 32 y.o. female.  Patient complains of swelling to left upper quadrant.  No fever no chills no vomiting   The history is provided by the patient.  Illness  This is a new problem. The current episode started more than 1 week ago. The problem occurs constantly. The problem has not changed since onset.Associated symptoms include abdominal pain. Pertinent negatives include no chest pain and no headaches. Nothing aggravates the symptoms. Nothing relieves the symptoms.    Past Medical History:  Diagnosis Date  . 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 Normal delivery 02/14/2012  . Pericardial cyst     Patient Active Problem List   Diagnosis Date Noted  . Normal delivery 02/14/2012    Past Surgical History:  Procedure Laterality Date  . Adnoidectomy    . TONSILLECTOMY      OB History    Gravida Para Term Preterm AB Living   3 1 1  0 2 1   SAB TAB Ectopic Multiple Live Births   2 0 0 0 1       Home Medications    Prior to Admission medications   Medication Sig Start Date End Date Taking? Authorizing Provider  acetaminophen (TYLENOL) 500 MG tablet Take 500 mg by mouth every 6 (six) hours as needed for mild pain.   Yes [provider]  ibuprofen (ADVIL,MOTRIN) 200 MG tablet Take 800 mg by mouth every 6 (six) hours as needed.    Yes [provider]  Multiple Vitamins-Minerals (AIRBORNE PO) Take 1 tablet by mouth daily.   Yes [provider]  pantoprazole (PROTONIX) 40 MG tablet Take 40 mg by mouth daily as needed (acid reflux).   Yes [provider]    Family History Family History  Problem Relation Age of Onset  . Hepatitis Mother   . Diabetes Mother        borderline  . Heart disease Mother   . Depression Mother    . Hyperlipidemia Father   . AAA (abdominal aortic aneurysm) Paternal Grandfather     Social History Social History   Tobacco Use  . Smoking status: Current Every Day Smoker    Packs/day: 0.25  . Smokeless tobacco: Never Used  Substance Use Topics  . Alcohol use: Yes  . Drug use: No     Allergies   Amoxicillin and Latex   Review of Systems Review of Systems  Constitutional: Negative for appetite change and fatigue.  HENT: Negative for congestion, ear discharge and sinus pressure.   Eyes: Negative for discharge.  Respiratory: Negative for cough.   Cardiovascular: Negative for chest pain.  Gastrointestinal: Positive for abdominal pain. Negative for diarrhea.       Welling to left upper quadrant  Genitourinary: Negative for frequency and hematuria.  Musculoskeletal: Negative for back pain.  Skin: Negative for rash.  Neurological: Negative for seizures and headaches.  Psychiatric/Behavioral: Negative for hallucinations.     Physical Exam Updated Vital Signs BP (!) 152/96 (BP Location: Right Arm)   Pulse 90   Temp 97.8 F (36.6 C) (Oral)   Resp 18   Ht 5\' 2"  (1.575 m)   Wt 95.3 kg (210 lb)   LMP 04/01/2017   SpO2 100%   BMI  38.41 kg/m   Physical Exam  Constitutional: She is oriented to person, place, and time. She appears well-developed.  HENT:  Head: Normocephalic.  Eyes: Conjunctivae and EOM are normal. No scleral icterus.  Neck: Neck supple. No thyromegaly present.  Cardiovascular: Normal rate and regular rhythm. Exam reveals no gallop and no friction rub.  No murmur heard. Pulmonary/Chest: No stridor. She has no wheezes. She has no rales. She exhibits no tenderness.  Abdominal: She exhibits no distension. There is no tenderness. There is no rebound.  Musculoskeletal: Normal range of motion. She exhibits no edema.  Lymphadenopathy:    She has no cervical adenopathy.  Neurological: She is oriented to person, place, and time. She exhibits normal muscle  tone. Coordination normal.  Skin: No rash noted. No erythema.  Psychiatric: She has a normal mood and affect. Her behavior is normal.     ED Treatments / Results  Labs (all labs ordered are listed, but only abnormal results are displayed) Labs Reviewed  CBC WITH DIFFERENTIAL/PLATELET - Abnormal; Notable for the following components:      Result Value   Hemoglobin 11.8 (*)    HCT 35.3 (*)    All other components within normal limits  COMPREHENSIVE METABOLIC PANEL - Abnormal; Notable for the following components:   Glucose, Bld 100 (*)    All other components within normal limits    EKG  EKG Interpretation None       Radiology Dg Chest 2 View  Result Date: 04/04/2017 CLINICAL DATA:  Chest swelling, history of pericardial cyst. EXAM: CHEST  2 VIEW COMPARISON:  Chest radiograph August 14, 2016 and MRI of the chest April 26, 2016 FINDINGS: Cardiomediastinal silhouette is normal. No pleural effusions or focal consolidations. Mild bronchitic changes and bibasilar strandy densities. Trachea projects midline and there is no pneumothorax. Soft tissue planes and included osseous structures are non-suspicious. IMPRESSION: Bronchitic changes with bibasilar atelectasis. Electronically Signed   By: Awilda Metroourtnay  Bloomer M.D.   On: 04/04/2017 21:57    Procedures Procedures (including critical care time)  Medications Ordered in ED Medications - No data to display   Initial Impression / Assessment and Plan / ED Course  I have reviewed the triage vital signs and the nursing notes.  Pertinent labs & imaging results that were available during my care of the patient were reviewed by me and considered in my medical decision making (see chart for details).     CBC chemistries abdominal series all unremarkable except for mild anemia.  Patient with the feeling of swelling to the left upper quadrant.  She will follow-up with PCP  Final Clinical Impressions(s) / ED Diagnoses   Final diagnoses:  Pain  of upper abdomen    ED Discharge Orders    None       Bethann BerkshireZammit, Keiana Tavella, MD 04/04/17 2325

## 2017-04-04 NOTE — ED Triage Notes (Signed)
Pt states her lymph nodes in neck and under breast are swelling. Pt states she has known cyst in her chest that is bothering her.

## 2017-04-04 NOTE — Discharge Instructions (Signed)
Follow-up with your family doctor next week for recheck. 

## 2017-07-19 ENCOUNTER — Emergency Department (HOSPITAL_COMMUNITY)
Admission: EM | Admit: 2017-07-19 | Discharge: 2017-07-20 | Disposition: A | Discharge status: Home / Self Care | Payer: PRIVATE HEALTH INSURANCE | Attending: Emergency Medicine | Admitting: Emergency Medicine

## 2017-07-19 ENCOUNTER — Other Ambulatory Visit: Payer: Self-pay

## 2017-07-19 ENCOUNTER — Encounter (HOSPITAL_COMMUNITY): Payer: Self-pay | Admitting: Emergency Medicine

## 2017-07-19 ENCOUNTER — Emergency Department (HOSPITAL_COMMUNITY): Payer: PRIVATE HEALTH INSURANCE

## 2017-07-19 DIAGNOSIS — M7989 Other specified soft tissue disorders: Secondary | ICD-10-CM

## 2017-07-19 DIAGNOSIS — F17200 Nicotine dependence, unspecified, uncomplicated: Secondary | ICD-10-CM | POA: Insufficient documentation

## 2017-07-19 DIAGNOSIS — Z91040 Latex allergy status: Secondary | ICD-10-CM | POA: Insufficient documentation

## 2017-07-19 DIAGNOSIS — Z79899 Other long term (current) drug therapy: Secondary | ICD-10-CM | POA: Insufficient documentation

## 2017-07-19 DIAGNOSIS — R2242 Localized swelling, mass and lump, left lower limb: Secondary | ICD-10-CM | POA: Diagnosis present

## 2017-07-19 DIAGNOSIS — R531 Weakness: Secondary | ICD-10-CM | POA: Insufficient documentation

## 2017-07-19 NOTE — ED Notes (Signed)
Lab at bedside

## 2017-07-19 NOTE — ED Triage Notes (Signed)
Pt reports feeling "knots in her sides and they were pulsating." Pt also reports her left foot began to began to swell 2 days ago. Pt states she has been feeling weak for 2 days.

## 2017-07-19 NOTE — ED Provider Notes (Signed)
[facility] EMERGENCY DEPARTMENT Provider Note   CSN: [id number] Arrival date & time: 07/19/17  2319     History   Chief Complaint Chief Complaint  Patient presents with  . Weakness    HPI Joanna [name] is a 32 y.o. female.  HPI  This is a 32 year old female who presents with multiple complaints.  She reports over the last 2 days she has noted left foot discoloration and swelling.  She first noted it in the middle of the night.  She woke up to a discolored left foot.  It was nonpainful but she does report numbness in the left leg.  She also noted left foot swelling.  No notable injury or bites.  Currently she is asymptomatic.  However, she got scared tonight because she felt weak.  She states that over the last year she has dealt with "knots" in her left abdomen and under her left breast as well as "bulging in her left chest wall."  She states that she has had multiple evaluations as well as MRI that "showed some abnormality in her chest."  She denies chest pain or shortness of breath.  She denies headache.  She does report occasional weakness and numbness in the left arm and left leg.  Denies vision changes.  Reports near syncope.  These are all chronic things that she has dealt with that seems to have waxed and waned over the last 1 year.  Patient denies any abdominal pain, recent infectious symptoms, tick bites, rashes.  She denies any hormonal birth control use, history of blood clots, recent hospitalization, recent travel.  I reviewed the patient's chart.  She had an MRI of the chest with a stable redundant pericardial tissue thought to be benign.  Past Medical History:  Diagnosis Date  . 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 Normal delivery 02/14/2012  . Pericardial cyst     Patient Active Problem List   Diagnosis Date Noted  . Normal delivery 02/14/2012    Past Surgical History:  Procedure Laterality Date  . Adnoidectomy    . TONSILLECTOMY       OB History    Gravida  3   Para  1   Term  1   Preterm  0   AB  2   Living  1     SAB  2   TAB  0   Ectopic  0   Multiple  0   Live Births  1            Home Medications    Prior to Admission medications   Medication Sig Start Date End Date Taking? Authorizing Provider  acetaminophen (TYLENOL) 500 MG tablet Take 500 mg by mouth every 6 (six) hours as needed for mild pain.    [provider]  ibuprofen (ADVIL,MOTRIN) 200 MG tablet Take 800 mg by mouth every 6 (six) hours as needed.     [provider]  Multiple Vitamins-Minerals (AIRBORNE PO) Take 1 tablet by mouth daily.    [provider]  pantoprazole (PROTONIX) 40 MG tablet Take 40 mg by mouth daily as needed (acid reflux).    [provider]    Family History Family History  Problem Relation Age of Onset  . Hepatitis Mother   . Diabetes Mother        borderline  . Heart disease Mother   . Depression Mother   . Hyperlipidemia Father   . AAA (abdominal aortic aneurysm)  Paternal Grandfather     Social History Social History   Tobacco Use  . Smoking status: Current Every Day Smoker    Packs/day: 0.25  . Smokeless tobacco: Never Used  Substance Use Topics  . Alcohol use: Yes  . Drug use: No     Allergies   Amoxicillin and Latex   Review of Systems Review of Systems  Constitutional: Negative for fever.  Respiratory: Negative for shortness of breath.   Cardiovascular: Positive for leg swelling. Negative for chest pain.  Gastrointestinal: Positive for nausea. Negative for abdominal pain and vomiting.  Genitourinary: Negative for dysuria.  Skin: Positive for color change.  Neurological: Positive for weakness and numbness.  All other systems reviewed and are negative.    Physical Exam Updated Vital Signs BP 117/77   Pulse 62   Temp 97.8 F (36.6 C) (Oral)   Resp 12   Ht 5\' 2"  (1.575 m)   Wt 97.5 kg (215 lb)   LMP 06/21/2017   SpO2 96%   BMI 39.32 kg/m    Physical Exam  Constitutional: She is oriented to person, place, and time.  Overweight, anxious appearing, nontoxic  HENT:  Head: Normocephalic and atraumatic.  Mouth/Throat: Oropharynx is clear and moist.  Eyes: Pupils are equal, round, and reactive to light. EOM are normal.  Neck: Normal range of motion. Neck supple.  Cardiovascular: Normal rate, regular rhythm and normal heart sounds.  No murmur heard. Pulmonary/Chest: Effort normal and breath sounds normal. No respiratory distress. She has no wheezes. She exhibits no tenderness.  No palpable mass or asymmetry  Abdominal: Soft. Bowel sounds are normal. She exhibits no mass. There is no tenderness.  Musculoskeletal: Normal range of motion. She exhibits no edema.  No significant swelling of the left foot or calf noted, no tenderness to palpation, 2+ DP pulses bilaterally  Neurological: She is alert and oriented to person, place, and time. No cranial nerve deficit.  Cranial nerves II through XII intact, 5 out of 5 strength in all 4 extremities, no dysmetria to finger-nose-finger  Skin: Skin is warm and dry.  No current discoloration noted of the left foot  Psychiatric: She has a normal mood and affect.  Nursing note and vitals reviewed.    ED Treatments / Results  Labs (all labs ordered are listed, but only abnormal results are displayed) Labs Reviewed  CBC WITH DIFFERENTIAL/PLATELET - Abnormal; Notable for the following components:      Result Value   Monocytes Absolute 1.3 (*)    All other components within normal limits  COMPREHENSIVE METABOLIC PANEL - Abnormal; Notable for the following components:   Glucose, Bld 107 (*)    Total Bilirubin 0.2 (*)    All other components within normal limits  URINALYSIS, ROUTINE W REFLEX MICROSCOPIC - Abnormal; Notable for the following components:   Color, Urine COLORLESS (*)    Specific Gravity, Urine 1.003 (*)    All other components within normal limits  PREGNANCY, URINE  D-DIMER,  QUANTITATIVE (NOT [facility])    EKG EKG Interpretation  Date/Time:  Tuesday July 19 2017 23:54:07 EDT Ventricular Rate:  87 PR Interval:    QRS Duration: 84 QT Interval:  360 QTC Calculation: 433 R Axis:   45 Text Interpretation:  Sinus rhythm Borderline repolarization abnormality Flipped Ts inferiorly Confirmed by Ross MarcusHorton, Courtney ([id number]) on 07/19/2017 11:56:38 PM   Radiology Dg Chest 2 View  Result Date: 07/20/2017 CLINICAL DATA:  32 y/o F; swelling and pressure to the area underneath the  left breast, left lower chest, left-sided collarbone for 1 year on and off. Cough for a few days. EXAM: CHEST - 2 VIEW COMPARISON:  04/04/2017 chest radiograph FINDINGS: Stable heart size and mediastinal contours are within normal limits. Both lungs are clear. The visualized skeletal structures are unremarkable. IMPRESSION: No acute pulmonary process identified. Electronically Signed   By: Mitzi Hansen M.D.   On: 07/20/2017 00:46    Procedures Procedures (including critical care time)  Medications Ordered in ED Medications - No data to display   Initial Impression / Assessment and Plan / ED Course  I have reviewed the triage vital signs and the nursing notes.  Pertinent labs & imaging results that were available during my care of the patient were reviewed by me and considered in my medical decision making (see chart for details).     Patient presents with multiple complaints.  Her acute complaint is left foot discoloration and swelling 2 nights ago which has since resolved.  Additionally she reports chronic symptoms mostly on the left side of her abdomen and chest as well as some weakness and numbness peer overall nontoxic-appearing vital signs are reassuring.  Neurologic exam today is normal.  She has no obvious swelling or discoloration of the foot or calf.  She is low risk for DVT.  However, will obtain a d-dimer.  Basic lab work also obtained.  I have reviewed her chart as above.   Given chest complaints, screening chest x-ray was obtained without evidence of mass, pneumonia or other abnormality.  D-dimer is negative making blood clot unlikely.  EKG is reassuring.  At this time I do not feel she has an acute emergent process.  Given her left-sided symptoms that have been ongoing and waxing and waning over the last year, feel neurology evaluation may be appropriate as she is appropriate age for MS.  However, I do not feel she needs overt emergent evaluation at this time as her neurologic exam is normal.  I discussed the results with the patient and answered her questions.  After history, exam, and medical workup I feel the patient has been appropriately medically screened and is safe for discharge home. Pertinent diagnoses were discussed with the patient. Patient was given return precautions.   Final Clinical Impressions(s) / ED Diagnoses   Final diagnoses:  Generalized weakness  Swelling of left foot    ED Discharge Orders    None       Shon Baton, MD 07/20/17 [phone]

## 2017-07-20 LAB — URINALYSIS, ROUTINE W REFLEX MICROSCOPIC
Bilirubin Urine: NEGATIVE
Glucose, UA: NEGATIVE mg/dL
Hgb urine dipstick: NEGATIVE
Ketones, ur: NEGATIVE mg/dL
Leukocytes, UA: NEGATIVE
Nitrite: NEGATIVE
Protein, ur: NEGATIVE mg/dL
Specific Gravity, Urine: 1.003 — ABNORMAL LOW (ref 1.005–1.030)
pH: 7 (ref 5.0–8.0)

## 2017-07-20 LAB — CBC WITH DIFFERENTIAL/PLATELET
Basophils Absolute: 0 10*3/uL (ref 0.0–0.1)
Basophils Relative: 0 %
Eosinophils Absolute: 0.3 10*3/uL (ref 0.0–0.7)
Eosinophils Relative: 3 %
HCT: 38.5 % (ref 36.0–46.0)
Hemoglobin: 13 g/dL (ref 12.0–15.0)
Lymphocytes Relative: 28 %
Lymphs Abs: 2.7 10*3/uL (ref 0.7–4.0)
MCH: 29.5 pg (ref 26.0–34.0)
MCHC: 33.8 g/dL (ref 30.0–36.0)
MCV: 87.5 fL (ref 78.0–100.0)
Monocytes Absolute: 1.3 10*3/uL — ABNORMAL HIGH (ref 0.1–1.0)
Monocytes Relative: 13 %
Neutro Abs: 5.3 10*3/uL (ref 1.7–7.7)
Neutrophils Relative %: 56 %
Platelets: 214 10*3/uL (ref 150–400)
RBC: 4.4 MIL/uL (ref 3.87–5.11)
RDW: 13.5 % (ref 11.5–15.5)
WBC: 9.5 10*3/uL (ref 4.0–10.5)

## 2017-07-20 LAB — COMPREHENSIVE METABOLIC PANEL
ALT: 21 U/L (ref 14–54)
AST: 20 U/L (ref 15–41)
Albumin: 4.1 g/dL (ref 3.5–5.0)
Alkaline Phosphatase: 70 U/L (ref 38–126)
Anion gap: 9 (ref 5–15)
BUN: 11 mg/dL (ref 6–20)
CO2: 22 mmol/L (ref 22–32)
Calcium: 10 mg/dL (ref 8.9–10.3)
Chloride: 107 mmol/L (ref 101–111)
Creatinine, Ser: 0.74 mg/dL (ref 0.44–1.00)
GFR calc Af Amer: >60 mL/min (ref >60)
GFR calc non Af Amer: >60 mL/min (ref >60)
Glucose, Bld: 107 mg/dL — ABNORMAL HIGH (ref 65–99)
Potassium: 3.7 mmol/L (ref 3.5–5.1)
Sodium: 138 mmol/L (ref 135–145)
Total Bilirubin: 0.2 mg/dL — ABNORMAL LOW (ref 0.3–1.2)
Total Protein: 7.8 g/dL (ref 6.5–8.1)

## 2017-07-20 LAB — PREGNANCY, URINE: Preg Test, Ur: NEGATIVE

## 2017-07-20 LAB — D-DIMER, QUANTITATIVE: D-Dimer, Quant: <0.27 ug/mL-FEU (ref 0.00–0.50)

## 2017-07-20 NOTE — ED Notes (Signed)
Pt returned from xray

## 2017-07-20 NOTE — Discharge Instructions (Addendum)
You were seen today for multiple complaints.  Your work-up here is reassuring.  Your screening test for blood clots is negative.  Given that many of your symptoms are left-sided and involve some weakness and numbness, you may need follow-up with neurology for further testing.  If you develop any new or worsening symptoms you should be reevaluated by her primary physician.

## 2017-07-20 NOTE — ED Notes (Signed)
Pt transported to xray 

## 2017-11-09 ENCOUNTER — Encounter (HOSPITAL_COMMUNITY): Payer: Self-pay | Admitting: *Deleted

## 2017-11-09 ENCOUNTER — Emergency Department (HOSPITAL_COMMUNITY): Payer: Self-pay

## 2017-11-09 ENCOUNTER — Emergency Department (HOSPITAL_COMMUNITY)
Admission: EM | Admit: 2017-11-09 | Discharge: 2017-11-09 | Disposition: A | Discharge status: Home / Self Care | Payer: Self-pay | Attending: Emergency Medicine | Admitting: Emergency Medicine

## 2017-11-09 ENCOUNTER — Other Ambulatory Visit: Payer: Self-pay

## 2017-11-09 DIAGNOSIS — Z3A01 Less than 8 weeks gestation of pregnancy: Secondary | ICD-10-CM | POA: Insufficient documentation

## 2017-11-09 DIAGNOSIS — O99331 Smoking (tobacco) complicating pregnancy, first trimester: Secondary | ICD-10-CM | POA: Insufficient documentation

## 2017-11-09 DIAGNOSIS — F17210 Nicotine dependence, cigarettes, uncomplicated: Secondary | ICD-10-CM | POA: Insufficient documentation

## 2017-11-09 DIAGNOSIS — R52 Pain, unspecified: Secondary | ICD-10-CM

## 2017-11-09 DIAGNOSIS — O200 Threatened abortion: Secondary | ICD-10-CM | POA: Insufficient documentation

## 2017-11-09 LAB — HCG, QUANTITATIVE, PREGNANCY: hCG, Beta Chain, Quant, S: 18630 m[IU]/mL — ABNORMAL HIGH (ref <5)

## 2017-11-09 MED ORDER — ONDANSETRON 4 MG PO TBDP
4.0000 mg | ORAL_TABLET | Freq: Once | ORAL | Status: AC
  Administered 2017-11-09: 4 mg via ORAL
  Filled 2017-11-09: qty 1

## 2017-11-09 MED ORDER — ONDANSETRON HCL 4 MG PO TABS: 4.0000 mg | ORAL_TABLET | Freq: Four times a day (QID) | ORAL | 0 refills | Status: DC | PRN

## 2017-11-09 NOTE — ED Triage Notes (Signed)
Pt c/o lower abdominal cramping and bloating, mainly on the left side that started this morning at 0400. Pt c/o nausea that started about 1 week ago. Pt's OB is Dr. Emelda Fear but doesn't have her first OB appt until 11/17/17. Denies vaginal bleeding. LMP 10/02/17 and was "short" per pt. Pt reports she had a miscarriage during her first pregnancy and is concerned for tubal pregnancy due to the pain. Pt had normal second pregnancy.

## 2017-11-17 ENCOUNTER — Encounter: Payer: Self-pay | Admitting: Adult Health

## 2017-11-17 ENCOUNTER — Ambulatory Visit (INDEPENDENT_AMBULATORY_CARE_PROVIDER_SITE_OTHER): Payer: Self-pay | Admitting: Adult Health

## 2017-11-17 VITALS — BP 126/77 | HR 88 | Ht 62.0 in | Wt 222.5 lb

## 2017-11-17 DIAGNOSIS — F329 Major depressive disorder, single episode, unspecified: Secondary | ICD-10-CM

## 2017-11-17 DIAGNOSIS — N83201 Unspecified ovarian cyst, right side: Secondary | ICD-10-CM | POA: Insufficient documentation

## 2017-11-17 DIAGNOSIS — Z3201 Encounter for pregnancy test, result positive: Secondary | ICD-10-CM

## 2017-11-17 DIAGNOSIS — Z3A01 Less than 8 weeks gestation of pregnancy: Secondary | ICD-10-CM

## 2017-11-17 DIAGNOSIS — F32A Depression, unspecified: Secondary | ICD-10-CM

## 2017-11-17 DIAGNOSIS — N926 Irregular menstruation, unspecified: Secondary | ICD-10-CM

## 2017-11-17 DIAGNOSIS — O3680X0 Pregnancy with inconclusive fetal viability, not applicable or unspecified: Secondary | ICD-10-CM

## 2017-11-17 LAB — POCT URINE PREGNANCY: Preg Test, Ur: POSITIVE — AB

## 2017-11-17 NOTE — Progress Notes (Signed)
  Subjective:     Patient ID: Joanna [name], female   DOB: [birth year], 32 y.o.   MRN: [id number]  HPI Joanna [name] is a 31 year old whtie female in for UPT, has missed a period and had 3+HPTs and was seen in ER 10/9 and QHCG was 18,630, and US showed GS with YS no embryo and large single cyst on ovary. She says she had low progesterone with daughter and has blithed  ovum once.   Review of Systems +missed period,had 3+HPTs +cramping +bloated Reviewed past medical,surgical, social and family history. Reviewed medications and allergies.     Objective:   Physical Exam BP 126/77 (BP Location: Left Arm, Patient Position: Sitting, Cuff Size: Large)   Pulse 88   Ht 5\' 2"  (1.575 m)   Wt 222 lb 8 oz (100.9 kg)   LMP 10/02/2017   BMI 40.70 kg/m UPT +, about 6+4 weeks by LMP with EDD 07/10/18.Skin warm and dry. Neck: mid line trachea, normal thyroid, good ROM, no lymphadenopathy noted. Lungs: clear to ausculation bilaterally. Cardiovascular: regular rate and rhythm.abdomen is soft and non tender. PHQ 9 score 8, denies being suicidal.   Will get labs and [place] and talk after that.  Assessment:     1. Pregnancy examination or test, positive result   2. Less than [redacted] weeks gestation of pregnancy   3. Encounter to determine fetal viability of pregnancy, single or unspecified fetus   4. Cyst of right ovary   5. Depression, unspecified depression type       Plan:     Check QHCG, progesterone level and ABORH Return in am for F/U US Review handouts on First trimester and by [facility]

## 2017-11-17 NOTE — Patient Instructions (Signed)
First Trimester of Pregnancy The first trimester of pregnancy is from week 1 until the end of week 13 (months 1 through 3). A week after a sperm fertilizes an egg, the egg will implant on the wall of the uterus. This embryo will begin to develop into a baby. Genes from you and your partner will form the baby. The female genes will determine whether the baby will be a boy or a girl. At 6-8 weeks, the eyes and face will be formed, and the heartbeat can be seen on ultrasound. At the end of 12 weeks, all the baby's organs will be formed. Now that you are pregnant, you will want to do everything you can to have a healthy baby. Two of the most important things are to get good prenatal care and to follow your health care provider's instructions. Prenatal care is all the medical care you receive before the baby's birth. This care will help prevent, find, and treat any problems during the pregnancy and childbirth. Body changes during your first trimester Your body goes through many changes during pregnancy. The changes vary from woman to woman.  You may gain or lose a couple of pounds at first.  You may feel sick to your stomach (nauseous) and you may throw up (vomit). If the vomiting is uncontrollable, call your health care provider.  You may tire easily.  You may develop headaches that can be relieved by medicines. All medicines should be approved by your health care provider.  You may urinate more often. Painful urination may mean you have a bladder infection.  You may develop heartburn as a result of your pregnancy.  You may develop constipation because certain hormones are causing the muscles that push stool through your intestines to slow down.  You may develop hemorrhoids or swollen veins (varicose veins).  Your breasts may begin to grow larger and become tender. Your nipples may stick out more, and the tissue that surrounds them (areola) may become darker.  Your gums may bleed and may be  sensitive to brushing and flossing.  Dark spots or blotches (chloasma, mask of pregnancy) may develop on your face. This will likely fade after the baby is born.  Your menstrual periods will stop.  You may have a loss of appetite.  You may develop cravings for certain kinds of food.  You may have changes in your emotions from day to day, such as being excited to be pregnant or being concerned that something may go wrong with the pregnancy and baby.  You may have more vivid and strange dreams.  You may have changes in your hair. These can include thickening of your hair, rapid growth, and changes in texture. Some women also have hair loss during or after pregnancy, or hair that feels dry or thin. Your hair will most likely return to normal after your baby is born.  What to expect at prenatal visits During a routine prenatal visit:  You will be weighed to make sure you and the baby are growing normally.  Your blood pressure will be taken.  Your abdomen will be measured to track your baby's growth.  The fetal heartbeat will be listened to between weeks 10 and 14 of your pregnancy.  Test results from any previous visits will be discussed.  Your health care provider may ask you:  How you are feeling.  If you are feeling the baby move.  If you have had any abnormal symptoms, such as leaking fluid, bleeding, severe headaches,   or abdominal cramping.  If you are using any tobacco products, including cigarettes, chewing tobacco, and electronic cigarettes.  If you have any questions.  Other tests that may be performed during your first trimester include:  Blood tests to find your blood type and to check for the presence of any previous infections. The tests will also be used to check for low iron levels (anemia) and protein on red blood cells (Rh antibodies). Depending on your risk factors, or if you previously had diabetes during pregnancy, you may have tests to check for high blood  sugar that affects pregnant women (gestational diabetes).  Urine tests to check for infections, diabetes, or protein in the urine.  An ultrasound to confirm the proper growth and development of the baby.  Fetal screens for spinal cord problems (spina bifida) and Down syndrome.  HIV (human immunodeficiency virus) testing. Routine prenatal testing includes screening for HIV, unless you choose not to have this test.  You may need other tests to make sure you and the baby are doing well.  Follow these instructions at home: Medicines  Follow your health care provider's instructions regarding medicine use. Specific medicines may be either safe or unsafe to take during pregnancy.  Take a prenatal vitamin that contains at least 600 micrograms (mcg) of folic acid.  If you develop constipation, try taking a stool softener if your health care provider approves. Eating and drinking  Eat a balanced diet that includes fresh fruits and vegetables, whole grains, good sources of protein such as meat, eggs, or tofu, and low-fat dairy. Your health care provider will help you determine the amount of weight gain that is right for you.  Avoid raw meat and uncooked cheese. These carry germs that can cause birth defects in the baby.  Eating four or five small meals rather than three large meals a day may help relieve nausea and vomiting. If you start to feel nauseous, eating a few soda crackers can be helpful. Drinking liquids between meals, instead of during meals, also seems to help ease nausea and vomiting.  Limit foods that are high in fat and processed sugars, such as fried and sweet foods.  To prevent constipation: ? Eat foods that are high in fiber, such as fresh fruits and vegetables, whole grains, and beans. ? Drink enough fluid to keep your urine clear or pale yellow. Activity  Exercise only as directed by your health care provider. Most women can continue their usual exercise routine during  pregnancy. Try to exercise for 30 minutes at least 5 days a week. Exercising will help you: ? Control your weight. ? Stay in shape. ? Be prepared for labor and delivery.  Experiencing pain or cramping in the lower abdomen or lower back is a good sign that you should stop exercising. Check with your health care provider before continuing with normal exercises.  Try to avoid standing for long periods of time. Move your legs often if you must stand in one place for a long time.  Avoid heavy lifting.  Wear low-heeled shoes and practice good posture.  You may continue to have sex unless your health care provider tells you not to. Relieving pain and discomfort  Wear a good support bra to relieve breast tenderness.  Take warm sitz baths to soothe any pain or discomfort caused by hemorrhoids. Use hemorrhoid cream if your health care provider approves.  Rest with your legs elevated if you have leg cramps or low back pain.  If you develop   varicose veins in your legs, wear support hose. Elevate your feet for 15 minutes, 3-4 times a day. Limit salt in your diet. Prenatal care  Schedule your prenatal visits by the twelfth week of pregnancy. They are usually scheduled monthly at first, then more often in the last 2 months before delivery.  Write down your questions. Take them to your prenatal visits.  Keep all your prenatal visits as told by your health care provider. This is important. Safety  Wear your seat belt at all times when driving.  Make a list of emergency phone numbers, including numbers for family, friends, the hospital, and police and fire departments. General instructions  Ask your health care provider for a referral to a local prenatal education class. Begin classes no later than the beginning of month 6 of your pregnancy.  Ask for help if you have counseling or nutritional needs during pregnancy. Your health care provider can offer advice or refer you to specialists for help  with various needs.  Do not use hot tubs, steam rooms, or saunas.  Do not douche or use tampons or scented sanitary pads.  Do not cross your legs for long periods of time.  Avoid cat litter boxes and soil used by cats. These carry germs that can cause birth defects in the baby and possibly loss of the fetus by miscarriage or stillbirth.  Avoid all smoking, herbs, alcohol, and medicines not prescribed by your health care provider. Chemicals in these products affect the formation and growth of the baby.  Do not use any products that contain nicotine or tobacco, such as cigarettes and e-cigarettes. If you need help quitting, ask your health care provider. You may receive counseling support and other resources to help you quit.  Schedule a dentist appointment. At home, brush your teeth with a soft toothbrush and be gentle when you floss. Contact a health care provider if:  You have dizziness.  You have mild pelvic cramps, pelvic pressure, or nagging pain in the abdominal area.  You have persistent nausea, vomiting, or diarrhea.  You have a bad smelling vaginal discharge.  You have pain when you urinate.  You notice increased swelling in your face, hands, legs, or ankles.  You are exposed to fifth disease or chickenpox.  You are exposed to German measles (rubella) and have never had it. Get help right away if:  You have a fever.  You are leaking fluid from your vagina.  You have spotting or bleeding from your vagina.  You have severe abdominal cramping or pain.  You have rapid weight gain or loss.  You vomit blood or material that looks like coffee grounds.  You develop a severe headache.  You have shortness of breath.  You have any kind of trauma, such as from a fall or a car accident. Summary  The first trimester of pregnancy is from week 1 until the end of week 13 (months 1 through 3).  Your body goes through many changes during pregnancy. The changes vary from  woman to woman.  You will have routine prenatal visits. During those visits, your health care provider will examine you, discuss any test results you may have, and talk with you about how you are feeling. This information is not intended to replace advice given to you by your health care provider. Make sure you discuss any questions you have with your health care provider. Document Released: 01/12/2001 Document Revised: 12/31/2015 Document Reviewed: 12/31/2015 Elsevier Interactive Patient Education  2018 [employer]  

## 2017-11-18 ENCOUNTER — Ambulatory Visit (INDEPENDENT_AMBULATORY_CARE_PROVIDER_SITE_OTHER): Payer: Self-pay

## 2017-11-18 DIAGNOSIS — O3680X0 Pregnancy with inconclusive fetal viability, not applicable or unspecified: Secondary | ICD-10-CM

## 2017-11-18 DIAGNOSIS — Z3A01 Less than 8 weeks gestation of pregnancy: Secondary | ICD-10-CM

## 2017-11-18 NOTE — Progress Notes (Signed)
[place] 6+1 wks,single IUP w/ys,positive FHT 126 bpm,normal left ovary,simple right ovarian cyst 6 x 5.4 x 4.6 cm,crl 5.29 mm

## 2017-11-21 ENCOUNTER — Encounter: Payer: Self-pay | Admitting: Adult Health

## 2017-11-21 NOTE — ED Provider Notes (Signed)
[facility] EMERGENCY DEPARTMENT Provider Note   CSN: [id number] Arrival date & time: 11/09/17  4098     History   Chief Complaint Chief Complaint  Patient presents with  . Abdominal Pain    Pregnant    HPI Joanna [name] is a 32 y.o. female.  HPI   32 year old female with lower abdominal/pelvic pain and vaginal bleeding.  Describes as cramps.  Began around 4:00 this morning.  She is pregnant.  Approximately 6 weeks by dates.  She has an upcoming OB appointment.  This is her third pregnancy.  She had a miscarriage during her first 1.  She care her second child full-term without any issues.  Past Medical History:  Diagnosis Date  . 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 Normal delivery 02/14/2012  . Pericardial cyst   . Thoracic cyst     Patient Active Problem List   Diagnosis Date Noted  . Pregnancy examination or test, positive result 11/17/2017  . Less than [redacted] weeks gestation of pregnancy 11/17/2017  . Encounter to determine fetal viability of pregnancy 11/17/2017  . Cyst of right ovary 11/17/2017  . Depression 11/17/2017    Past Surgical History:  Procedure Laterality Date  . TONSILLECTOMY AND ADENOIDECTOMY       OB History    Gravida  3   Para  1   Term  1   Preterm  0   AB  1   Living  1     SAB  1   TAB  0   Ectopic  0   Multiple  0   Live Births  1            Home Medications    Prior to Admission medications   Medication Sig Start Date End Date Taking? Authorizing Provider  acetaminophen (TYLENOL) 500 MG tablet Take 500 mg by mouth every 6 (six) hours as needed for mild pain.   Yes [provider]  ondansetron (ZOFRAN) 4 MG tablet Take 1 tablet (4 mg total) by mouth every 6 (six) hours as needed for nausea or vomiting. 11/09/17   Raeford Razor, MD    Family History Family History  Problem Relation Age of Onset  . Hepatitis Mother   . Diabetes Mother        borderline  . Heart disease Mother   . Depression  Mother   . Hyperlipidemia Father   . AAA (abdominal aortic aneurysm) Paternal Grandfather     Social History Social History   Tobacco Use  . Smoking status: Current Every Day Smoker    Years: 10.00    Types: Cigarettes  . Smokeless tobacco: Never Used  . Tobacco comment: 2-3 cigarettes daily, pt trying to quit  Substance Use Topics  . Alcohol use: Never    Frequency: Never  . Drug use: No     Allergies   Amoxicillin and Latex   Review of Systems Review of Systems  All systems reviewed and negative, other than as noted in HPI.  Physical Exam Updated Vital Signs BP 125/70 (BP Location: Left Arm)   Pulse 83   Temp 99 F (37.2 C) (Oral)   Resp 17   Ht 5\' 2"  (1.575 m)   Wt 99.3 kg   LMP 10/02/2017   SpO2 98%   BMI 40.06 kg/m   Physical Exam  Constitutional: She appears well-developed and well-nourished. No distress.  HENT:  Head: Normocephalic and atraumatic.  Eyes: Conjunctivae are normal. Right  eye exhibits no discharge. Left eye exhibits no discharge.  Neck: Neck supple.  Cardiovascular: Normal rate, regular rhythm and normal heart sounds. Exam reveals no gallop and no friction rub.  No murmur heard. Pulmonary/Chest: Effort normal and breath sounds normal. No respiratory distress.  Abdominal: Soft. She exhibits no distension. There is no tenderness.  Musculoskeletal: She exhibits no edema or tenderness.  Neurological: She is alert.  Skin: Skin is warm and dry.  Psychiatric: She has a normal mood and affect. Her behavior is normal. Thought content normal.  Nursing note and vitals reviewed.    ED Treatments / Results  Labs (all labs ordered are listed, but only abnormal results are displayed) Labs Reviewed  HCG, QUANTITATIVE, PREGNANCY - Abnormal; Notable for the following components:      Result Value   hCG, Beta Chain, Quant, S 18,630 (*)    All other components within normal limits    EKG None  Radiology No results found.   US Ob Less Than  14 Weeks With Ob Transvaginal  Result Date: 11/09/2017 CLINICAL DATA:  Acute onset left pelvic pain this morning. Gestational age by LMP of 5 weeks 3 days. EXAM: OBSTETRIC <14 WK [place] AND TRANSVAGINAL OB US TECHNIQUE: Both transabdominal and transvaginal ultrasound examinations were performed for complete evaluation of the gestation as well as the maternal uterus, adnexal regions, and pelvic cul-de-sac. Transvaginal technique was performed to assess early pregnancy. COMPARISON:  None. FINDINGS: Intrauterine gestational sac: Single Yolk sac:  Visualized. Embryo:  Not Visualized. MSD: 13 mm   6 w   1 d Subchorionic hemorrhage:  None visualized. Maternal uterus/adnexae: Left ovary is not directly visualized. Right ovary contains a corpus luteum cyst. There is also a large simple cyst which measures 6.1 cm. No complex adnexal mass or abnormal free fluid. IMPRESSION: Single intrauterine gestational sac measuring 6 weeks 1 day by mean sac diameter. Consider following b-hCG levels, and followup ultrasound to assess viability in 10 days. 6.1 cm benign-appearing right ovarian cyst. Recommend continued attention on follow-up ultrasound. Electronically Signed   By: Myles Rosenthal M.D.   On: 11/09/2017 11:37    Procedures Procedures (including critical care time)  Medications Ordered in ED Medications  ondansetron (ZOFRAN-ODT) disintegrating tablet 4 mg (4 mg Oral Given 11/09/17 1232)     Initial Impression / Assessment and Plan / ED Course  I have reviewed the triage vital signs and the nursing notes.  Pertinent labs & imaging results that were available during my care of the patient were reviewed by me and considered in my medical decision making (see chart for details).     32 year old female with lower abdominal pain early pregnancy.  Ultrasound shows no IUP.  Too early to determine viability.  She either needs trending of her hCG levels for repeat ultrasound in 1 to 2 weeks.  She has upcoming OB appointment.   She could discuss further at that time.  Emergent return precautions sooner discussed.  Final Clinical Impressions(s) / ED Diagnoses   Final diagnoses:  Threatened miscarriage    ED Discharge Orders         Ordered    ondansetron (ZOFRAN) 4 MG tablet  Every 6 hours PRN     11/09/17 1230           Raeford Razor, MD 11/21/17 1719

## 2017-12-09 ENCOUNTER — Encounter: Payer: Self-pay | Admitting: Women's Health

## 2017-12-09 ENCOUNTER — Ambulatory Visit: Payer: Self-pay | Admitting: *Deleted

## 2017-12-23 ENCOUNTER — Encounter: Payer: Self-pay | Admitting: Women's Health

## 2017-12-23 ENCOUNTER — Ambulatory Visit: Payer: Self-pay | Admitting: *Deleted

## 2017-12-27 ENCOUNTER — Other Ambulatory Visit (HOSPITAL_COMMUNITY)
Admission: RE | Admit: 2017-12-27 | Discharge: 2017-12-27 | Disposition: A | Discharge status: Home / Self Care | Payer: Medicaid Other | Source: Ambulatory Visit | Attending: Obstetrics & Gynecology | Admitting: Obstetrics & Gynecology

## 2017-12-27 ENCOUNTER — Ambulatory Visit (INDEPENDENT_AMBULATORY_CARE_PROVIDER_SITE_OTHER): Payer: Medicaid Other | Admitting: Women's Health

## 2017-12-27 ENCOUNTER — Ambulatory Visit: Payer: Medicaid Other | Admitting: *Deleted

## 2017-12-27 ENCOUNTER — Encounter: Payer: Self-pay | Admitting: Women's Health

## 2017-12-27 VITALS — BP 129/83 | HR 86 | Wt 219.0 lb

## 2017-12-27 DIAGNOSIS — Z3682 Encounter for antenatal screening for nuchal translucency: Secondary | ICD-10-CM

## 2017-12-27 DIAGNOSIS — Z1389 Encounter for screening for other disorder: Secondary | ICD-10-CM

## 2017-12-27 DIAGNOSIS — F17200 Nicotine dependence, unspecified, uncomplicated: Secondary | ICD-10-CM

## 2017-12-27 DIAGNOSIS — Z3A11 11 weeks gestation of pregnancy: Secondary | ICD-10-CM

## 2017-12-27 DIAGNOSIS — Z124 Encounter for screening for malignant neoplasm of cervix: Secondary | ICD-10-CM | POA: Diagnosis present

## 2017-12-27 DIAGNOSIS — Z3490 Encounter for supervision of normal pregnancy, unspecified, unspecified trimester: Secondary | ICD-10-CM | POA: Insufficient documentation

## 2017-12-27 DIAGNOSIS — Z8759 Personal history of other complications of pregnancy, childbirth and the puerperium: Secondary | ICD-10-CM

## 2017-12-27 DIAGNOSIS — Z3481 Encounter for supervision of other normal pregnancy, first trimester: Secondary | ICD-10-CM | POA: Insufficient documentation

## 2017-12-27 DIAGNOSIS — Z331 Pregnant state, incidental: Secondary | ICD-10-CM

## 2017-12-27 LAB — POCT URINALYSIS DIPSTICK OB
Blood, UA: NEGATIVE
Glucose, UA: NEGATIVE
Ketones, UA: NEGATIVE
Leukocytes, UA: NEGATIVE
Nitrite, UA: NEGATIVE
POC,PROTEIN,UA: NEGATIVE

## 2017-12-27 NOTE — Patient Instructions (Signed)
Joanna [name], I greatly value your feedback.  If you receive a survey following your visit with [place] today, we appreciate you taking the time to fill it out.  Thanks, [name], CNM, WHNP-BC  Begin taking 162mg  (two 81mg  tablets) baby aspirin daily at 12 weeks of pregnancy (11/28) to decrease risk of preeclampsia during pregnancy     Nausea & Vomiting  Have saltine crackers or pretzels by your bed and eat a few bites before you raise your head out of bed in the morning  Eat small frequent meals throughout the day instead of large meals  Drink plenty of fluids throughout the day to stay hydrated, just don't drink a lot of fluids with your meals.  This can make your stomach fill up faster making you feel sick  Do not brush your teeth right after you eat  Products with real ginger are good for nausea, like ginger ale and ginger hard candy Make sure it says made with real ginger!  Sucking on sour candy like lemon heads is also good for nausea  If your prenatal vitamins make you nauseated, take them at night so you will sleep through the nausea  Sea Bands  If you feel like you need medicine for the nausea & vomiting please let us know  If you are unable to keep any fluids or food down please let us know   Constipation  Drink plenty of fluid, preferably water, throughout the day  Eat foods high in fiber such as fruits, vegetables, and grains  Exercise, such as walking, is a good way to keep your bowels regular  Drink warm fluids, especially warm prune juice, or decaf coffee  Eat a 1/2 cup of real oatmeal (not instant), 1/2 cup applesauce, and 1/2-1 cup warm prune juice every day  If needed, you may take Colace (docusate sodium) stool softener once or twice a day to help keep the stool soft. If you are pregnant, wait until you are out of your first trimester (12-14 weeks of pregnancy)  If you still are having problems with constipation, you may take Miralax once daily as needed  to help keep your bowels regular.  If you are pregnant, wait until you are out of your first trimester (12-14 weeks of pregnancy)   First Trimester of Pregnancy The first trimester of pregnancy is from week 1 until the end of week 12 (months 1 through 3). A week after a sperm fertilizes an egg, the egg will implant on the wall of the uterus. This embryo will begin to develop into a baby. Genes from you and your partner are forming the baby. The female genes determine whether the baby is a boy or a girl. At 6-8 weeks, the eyes and face are formed, and the heartbeat can be seen on ultrasound. At the end of 12 weeks, all the baby's organs are formed.  Now that you are pregnant, you will want to do everything you can to have a healthy baby. Two of the most important things are to get good prenatal care and to follow your health care provider's instructions. Prenatal care is all the medical care you receive before the baby's birth. This care will help prevent, find, and treat any problems during the pregnancy and childbirth. BODY CHANGES Your body goes through many changes during pregnancy. The changes vary from woman to woman.   You may gain or lose a couple of pounds at first.  You may feel sick to your stomach (  nauseous) and throw up (vomit). If the vomiting is uncontrollable, call your health care provider.  You may tire easily.  You may develop headaches that can be relieved by medicines approved by your health care provider.  You may urinate more often. Painful urination may mean you have a bladder infection.  You may develop heartburn as a result of your pregnancy.  You may develop constipation because certain hormones are causing the muscles that push waste through your intestines to slow down.  You may develop hemorrhoids or swollen, bulging veins (varicose veins).  Your breasts may begin to grow larger and become tender. Your nipples may stick out more, and the tissue that surrounds them  (areola) may become darker.  Your gums may bleed and may be sensitive to brushing and flossing.  Dark spots or blotches (chloasma, mask of pregnancy) may develop on your face. This will likely fade after the baby is born.  Your menstrual periods will stop.  You may have a loss of appetite.  You may develop cravings for certain kinds of food.  You may have changes in your emotions from day to day, such as being excited to be pregnant or being concerned that something may go wrong with the pregnancy and baby.  You may have more vivid and strange dreams.  You may have changes in your hair. These can include thickening of your hair, rapid growth, and changes in texture. Some women also have hair loss during or after pregnancy, or hair that feels dry or thin. Your hair will most likely return to normal after your baby is born. WHAT TO EXPECT AT YOUR PRENATAL VISITS During a routine prenatal visit:  You will be weighed to make sure you and the baby are growing normally.  Your blood pressure will be taken.  Your abdomen will be measured to track your baby's growth.  The fetal heartbeat will be listened to starting around week 10 or 12 of your pregnancy.  Test results from any previous visits will be discussed. Your health care provider may ask you:  How you are feeling.  If you are feeling the baby move.  If you have had any abnormal symptoms, such as leaking fluid, bleeding, severe headaches, or abdominal cramping.  If you have any questions. Other tests that may be performed during your first trimester include:  Blood tests to find your blood type and to check for the presence of any previous infections. They will also be used to check for low iron levels (anemia) and Rh antibodies. Later in the pregnancy, blood tests for diabetes will be done along with other tests if problems develop.  Urine tests to check for infections, diabetes, or protein in the urine.  An ultrasound to  confirm the proper growth and development of the baby.  An amniocentesis to check for possible genetic problems.  Fetal screens for spina bifida and Down syndrome.  You may need other tests to make sure you and the baby are doing well. HOME CARE INSTRUCTIONS  Medicines  Follow your health care provider's instructions regarding medicine use. Specific medicines may be either safe or unsafe to take during pregnancy.  Take your prenatal vitamins as directed.  If you develop constipation, try taking a stool softener if your health care provider approves. Diet  Eat regular, well-balanced meals. Choose a variety of foods, such as meat or vegetable-based protein, fish, milk and low-fat dairy products, vegetables, fruits, and whole grain breads and cereals. Your health care provider will  help you determine the amount of weight gain that is right for you.  Avoid raw meat and uncooked cheese. These carry germs that can cause birth defects in the baby.  Eating four or five small meals rather than three large meals a day may help relieve nausea and vomiting. If you start to feel nauseous, eating a few soda crackers can be helpful. Drinking liquids between meals instead of during meals also seems to help nausea and vomiting.  If you develop constipation, eat more high-fiber foods, such as fresh vegetables or fruit and whole grains. Drink enough fluids to keep your urine clear or pale yellow. Activity and Exercise  Exercise only as directed by your health care provider. Exercising will help you:  Control your weight.  Stay in shape.  Be prepared for labor and delivery.  Experiencing pain or cramping in the lower abdomen or low back is a good sign that you should stop exercising. Check with your health care provider before continuing normal exercises.  Try to avoid standing for long periods of time. Move your legs often if you must stand in one place for a long time.  Avoid heavy  lifting.  Wear low-heeled shoes, and practice good posture.  You may continue to have sex unless your health care provider directs you otherwise. Relief of Pain or Discomfort  Wear a good support bra for breast tenderness.   Take warm sitz baths to soothe any pain or discomfort caused by hemorrhoids. Use hemorrhoid cream if your health care provider approves.   Rest with your legs elevated if you have leg cramps or low back pain.  If you develop varicose veins in your legs, wear support hose. Elevate your feet for 15 minutes, 3-4 times a day. Limit salt in your diet. Prenatal Care  Schedule your prenatal visits by the twelfth week of pregnancy. They are usually scheduled monthly at first, then more often in the last 2 months before delivery.  Write down your questions. Take them to your prenatal visits.  Keep all your prenatal visits as directed by your health care provider. Safety  Wear your seat belt at all times when driving.  Make a list of emergency phone numbers, including numbers for family, friends, the hospital, and police and fire departments. General Tips  Ask your health care provider for a referral to a local prenatal education class. Begin classes no later than at the beginning of month 6 of your pregnancy.  Ask for help if you have counseling or nutritional needs during pregnancy. Your health care provider can offer advice or refer you to specialists for help with various needs.  Do not use hot tubs, steam rooms, or saunas.  Do not douche or use tampons or scented sanitary pads.  Do not cross your legs for long periods of time.  Avoid cat litter boxes and soil used by cats. These carry germs that can cause birth defects in the baby and possibly loss of the fetus by miscarriage or stillbirth.  Avoid all smoking, herbs, alcohol, and medicines not prescribed by your health care provider. Chemicals in these affect the formation and growth of the baby.  Schedule  a dentist appointment. At home, brush your teeth with a soft toothbrush and be gentle when you floss. SEEK MEDICAL CARE IF:   You have dizziness.  You have mild pelvic cramps, pelvic pressure, or nagging pain in the abdominal area.  You have persistent nausea, vomiting, or diarrhea.  You have a bad smelling  vaginal discharge.  You have pain with urination.  You notice increased swelling in your face, hands, legs, or ankles. SEEK IMMEDIATE MEDICAL CARE IF:   You have a fever.  You are leaking fluid from your vagina.  You have spotting or bleeding from your vagina.  You have severe abdominal cramping or pain.  You have rapid weight gain or loss.  You vomit blood or material that looks like coffee grounds.  You are exposed to [place] measles and have never had them.  You are exposed to fifth disease or chickenpox.  You develop a severe headache.  You have shortness of breath.  You have any kind of trauma, such as from a fall or a car accident. Document Released: 01/12/2001 Document Revised: 06/04/2013 Document Reviewed: 11/28/2012 [facility] Patient Information 2015 [place], [place]. This information is not intended to replace advice given to you by your health care provider. Make sure you discuss any questions you have with your health care provider.

## 2017-12-27 NOTE — Progress Notes (Signed)
INITIAL OBSTETRICAL VISIT Patient name: Joanna [name] MRN [id number]  Date of birth: 01/03/1986 Chief Complaint:   Initial Prenatal Visit  History of Present Illness:   Joanna [name] is a 32 y.o. 763P1011 Caucasian female at 3775w5d by 6wk u/s, with an Estimated Date of Delivery: 07/13/18 being seen today for her initial obstetrical visit.   Her obstetrical history is significant for GHTN w/ IOL, SAB x1.   Today she reports no complaints. Smoker- 1/2ppd prior to pregnancy, now 1/4ppd- wants to use nicotine patch to help quit completely.  No LMP recorded (lmp unknown). Patient is pregnant. Last pap 07/15/11. Results were: normal Review of Systems:   Pertinent items are noted in HPI Denies cramping/contractions, leakage of fluid, vaginal bleeding, abnormal vaginal discharge w/ itching/odor/irritation, headaches, visual changes, shortness of breath, chest pain, abdominal pain, severe nausea/vomiting, or problems with urination or bowel movements unless otherwise stated above.  Pertinent History Reviewed:  Reviewed past medical,surgical, social, obstetrical and family history.  Reviewed problem list, medications and allergies. OB History  Gravida Para Term Preterm AB Living  3 1 1  0 1 1  SAB TAB Ectopic Multiple Live Births  1 0 0 0 1    # Outcome Date GA Lbr Len/2nd Weight Sex Delivery Anes PTL Lv  3 Current           2 Term 02/14/12 6011w1d 12:35 / 03:12 8 lb 0.8 oz (3.65 kg) F Vag-Spont EPI N LIV  1 SAB 02/01/09             Birth Comments: blighted ovum   Physical Assessment:   Vitals:   12/27/17 1050  BP: 129/83  Pulse: 86  Weight: 219 lb (99.3 kg)  Body mass index is 40.06 kg/m.       Physical Examination:  General appearance - well appearing, and in no distress  Mental status - alert, oriented to person, place, and time  Psych:  She has a normal mood and affect  Skin - warm and dry, normal color, no suspicious lesions noted  Chest - effort normal, all lung fields clear  to auscultation bilaterally  Heart - normal rate and regular rhythm  Abdomen - soft, nontender  Extremities:  No swelling or varicosities noted  Pelvic - VULVA: normal appearing vulva with no masses, tenderness or lesions  VAGINA: normal appearing vagina with normal color and discharge, no lesions  CERVIX: normal appearing cervix without discharge or lesions, no CMT  Thin prep pap is done w/ HR HPV cotesting  Fetal Heart Rate (bpm): +u/s via informal transabdominal u/s  Results for orders placed or performed in visit on 12/27/17 (from the past 24 hour(s))  POC Urinalysis Dipstick OB   Collection Time: 12/27/17 11:08 AM  Result Value Ref Range   Color, UA     Clarity, UA     Glucose, UA Negative Negative   Bilirubin, UA     Ketones, UA neg    Spec Grav, UA     Blood, UA neg    pH, UA     POC,PROTEIN,UA Negative Negative, Trace, Small (1+), Moderate (2+), Large (3+), 4+   Urobilinogen, UA     Nitrite, UA neg    Leukocytes, UA Negative Negative   Appearance     Odor      Assessment & Plan:  1) Low-Risk Pregnancy G3P1011 at 5575w5d with an Estimated Date of Delivery: 07/13/18   2) Initial OB visit  3) 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   Initial labs obtained Continue prenatal vitamins Reviewed n/v relief measures and warning s/s to report Reviewed recommended weight gain based on pre-gravid BMI Encouraged well-balanced diet Genetic Screening discussed Integrated Screen: requested Cystic fibrosis screening discussed requested Ultrasound discussed; fetal survey: requested CCNC completed>PCM not here  Follow-up: Return in about 9 days (around 01/05/2018) for US:NT+1stIT (no visit), then 3wks after that for LROB and 2nd IT.   Orders Placed This Encounter  Procedures  . Urine Culture  . US Fetal Nuchal  Translucency Measurement  . Obstetric Panel, Including HIV  . Urinalysis, Routine w reflex microscopic  . Cystic Fibrosis Mutation 97  . Pain Management Screening Profile (10S)  . Comprehensive metabolic panel  . Protein / creatinine ratio, urine  . POC Urinalysis Dipstick OB    Cheral Marker CNM, [facility] 12/27/2017 12:18 PM

## 2017-12-28 LAB — COMPREHENSIVE METABOLIC PANEL
ALT: 30 IU/L (ref 0–32)
AST: 14 IU/L (ref 0–40)
Albumin/Globulin Ratio: 1.4 (ref 1.2–2.2)
Albumin: 4.1 g/dL (ref 3.5–5.5)
Alkaline Phosphatase: 84 IU/L (ref 39–117)
BUN/Creatinine Ratio: 14 (ref 9–23)
BUN: 8 mg/dL (ref 6–20)
Bilirubin Total: <0.2 mg/dL (ref 0.0–1.2)
CO2: 16 mmol/L — ABNORMAL LOW (ref 20–29)
Calcium: 9.2 mg/dL (ref 8.7–10.2)
Chloride: 106 mmol/L (ref 96–106)
Creatinine, Ser: 0.56 mg/dL — ABNORMAL LOW (ref 0.57–1.00)
GFR calc Af Amer: 143 mL/min/{1.73_m2} (ref >59)
GFR calc non Af Amer: 124 mL/min/{1.73_m2} (ref >59)
Globulin, Total: 2.9 g/dL (ref 1.5–4.5)
Glucose: 75 mg/dL (ref 65–99)
Potassium: 4.3 mmol/L (ref 3.5–5.2)
Sodium: 138 mmol/L (ref 134–144)
Total Protein: 7 g/dL (ref 6.0–8.5)

## 2017-12-28 LAB — PMP SCREEN PROFILE (10S), URINE
Amphetamine Scrn, Ur: NEGATIVE ng/mL
BARBITURATE SCREEN URINE: NEGATIVE ng/mL
BENZODIAZEPINE SCREEN, URINE: NEGATIVE ng/mL
CANNABINOIDS UR QL SCN: NEGATIVE ng/mL
Cocaine (Metab) Scrn, Ur: NEGATIVE ng/mL
Creatinine(Crt), U: 166.3 mg/dL (ref 20.0–300.0)
Methadone Screen, Urine: NEGATIVE ng/mL
OXYCODONE+OXYMORPHONE UR QL SCN: NEGATIVE ng/mL
Opiate Scrn, Ur: NEGATIVE ng/mL
Ph of Urine: 6.4 (ref 4.5–8.9)
Phencyclidine Qn, Ur: NEGATIVE ng/mL
Propoxyphene Scrn, Ur: NEGATIVE ng/mL

## 2017-12-28 LAB — PROTEIN / CREATININE RATIO, URINE
Creatinine, Urine: 127.8 mg/dL
Protein, Ur: 10.1 mg/dL
Protein/Creat Ratio: 79 mg/g creat (ref 0–200)

## 2017-12-28 LAB — CYTOLOGY - PAP
Chlamydia: NEGATIVE
Diagnosis: NEGATIVE
HPV: NOT DETECTED
Neisseria Gonorrhea: NEGATIVE

## 2017-12-29 LAB — URINE CULTURE

## 2018-01-02 LAB — OBSTETRIC PANEL, INCLUDING HIV
Antibody Screen: NEGATIVE
Basophils Absolute: 0 10*3/uL (ref 0.0–0.2)
Basos: 0 %
EOS (ABSOLUTE): 0.3 10*3/uL (ref 0.0–0.4)
Eos: 2 %
HIV Screen 4th Generation wRfx: NONREACTIVE
Hematocrit: 35.3 % (ref 34.0–46.6)
Hemoglobin: 11.9 g/dL (ref 11.1–15.9)
Hepatitis B Surface Ag: NEGATIVE
Immature Grans (Abs): 0 10*3/uL (ref 0.0–0.1)
Immature Granulocytes: 0 %
Lymphocytes Absolute: 2.5 10*3/uL (ref 0.7–3.1)
Lymphs: 18 %
MCH: 29.5 pg (ref 26.6–33.0)
MCHC: 33.7 g/dL (ref 31.5–35.7)
MCV: 87 fL (ref 79–97)
Monocytes Absolute: 1.2 10*3/uL — ABNORMAL HIGH (ref 0.1–0.9)
Monocytes: 9 %
Neutrophils Absolute: 9.5 10*3/uL — ABNORMAL HIGH (ref 1.4–7.0)
Neutrophils: 71 %
Platelets: 212 10*3/uL (ref 150–450)
RBC: 4.04 x10E6/uL (ref 3.77–5.28)
RDW: 13.3 % (ref 12.3–15.4)
RPR Ser Ql: NONREACTIVE
Rh Factor: POSITIVE
Rubella Antibodies, IGG: 15.1 index (ref >0.99)
WBC: 13.5 10*3/uL — ABNORMAL HIGH (ref 3.4–10.8)

## 2018-01-02 LAB — URINALYSIS, ROUTINE W REFLEX MICROSCOPIC
Bilirubin, UA: NEGATIVE
Glucose, UA: NEGATIVE
Ketones, UA: NEGATIVE
Leukocytes, UA: NEGATIVE
Nitrite, UA: NEGATIVE
Protein, UA: NEGATIVE
RBC, UA: NEGATIVE
Specific Gravity, UA: 1.024 (ref 1.005–1.030)
Urobilinogen, Ur: 0.2 mg/dL (ref 0.2–1.0)
pH, UA: 6.5 (ref 5.0–7.5)

## 2018-01-02 LAB — CYSTIC FIBROSIS MUTATION 97: Interpretation: NOT DETECTED

## 2018-01-06 ENCOUNTER — Ambulatory Visit (INDEPENDENT_AMBULATORY_CARE_PROVIDER_SITE_OTHER): Payer: Medicaid Other

## 2018-01-06 ENCOUNTER — Other Ambulatory Visit: Payer: Medicaid Other

## 2018-01-06 DIAGNOSIS — F17200 Nicotine dependence, unspecified, uncomplicated: Secondary | ICD-10-CM

## 2018-01-06 DIAGNOSIS — Z3682 Encounter for antenatal screening for nuchal translucency: Secondary | ICD-10-CM | POA: Diagnosis not present

## 2018-01-06 DIAGNOSIS — Z1379 Encounter for other screening for genetic and chromosomal anomalies: Secondary | ICD-10-CM

## 2018-01-06 DIAGNOSIS — Z3481 Encounter for supervision of other normal pregnancy, first trimester: Secondary | ICD-10-CM

## 2018-01-06 NOTE — Progress Notes (Signed)
US 13+1 wks,measurements c/w dates,fhr 143 bpm,NB present, NT 1.9 mm,crl 69.89 mm,normal left ovary,simple right ovarian cyst 4.2 x 2.8 x 3.1 cm

## 2018-01-09 ENCOUNTER — Encounter: Payer: Self-pay | Admitting: Women's Health

## 2018-01-09 LAB — INTEGRATED 1
Crown Rump Length: 69.9 mm
Gest. Age on Collection Date: 13 weeks
Maternal Age at EDD: 32.9 yr
Nuchal Translucency (NT): 1.9 mm
Number of Fetuses: 1
PAPP-A Value: 357.6 ng/mL
Weight: 221 [lb_av]

## 2018-01-10 ENCOUNTER — Telehealth: Payer: Self-pay | Admitting: *Deleted

## 2018-01-10 NOTE — Telephone Encounter (Signed)
Patient states she has had a sore throat and dry throat in the morning when she wakes up.  Advised patient to try cough drops or chloroseptic spray, to run a humidifier and avoid using a fan in her room if possible.  Informed I resent the babyscripts app information and encouraged patient to activate acct.  Verbalized understanding.

## 2018-01-12 ENCOUNTER — Telehealth: Payer: Self-pay | Admitting: *Deleted

## 2018-01-12 NOTE — Telephone Encounter (Signed)
Patient states she is having head congestion which started on Monday. Cough, itchy/scratchy throat, green phlegm when she blows her nose, no fever.   She is thinking she has a sinus infection and is requesting an antibiotic. Please advise.

## 2018-01-12 NOTE — Telephone Encounter (Signed)
Patient informed usually viral, if still going on next Monday and not getting better let us know then will consider antibiotics. Advised to push fluids as well.  Verbalized understanding.

## 2018-01-17 ENCOUNTER — Encounter: Payer: Self-pay | Admitting: Advanced Practice Midwife

## 2018-01-17 ENCOUNTER — Ambulatory Visit (INDEPENDENT_AMBULATORY_CARE_PROVIDER_SITE_OTHER): Payer: Medicaid Other | Admitting: Advanced Practice Midwife

## 2018-01-17 ENCOUNTER — Other Ambulatory Visit: Payer: Self-pay

## 2018-01-17 VITALS — BP 119/69 | HR 77 | Wt 219.0 lb

## 2018-01-17 DIAGNOSIS — Z1389 Encounter for screening for other disorder: Secondary | ICD-10-CM

## 2018-01-17 DIAGNOSIS — Z331 Pregnant state, incidental: Secondary | ICD-10-CM

## 2018-01-17 DIAGNOSIS — Z3A14 14 weeks gestation of pregnancy: Secondary | ICD-10-CM

## 2018-01-17 DIAGNOSIS — Z363 Encounter for antenatal screening for malformations: Secondary | ICD-10-CM

## 2018-01-17 DIAGNOSIS — Z3482 Encounter for supervision of other normal pregnancy, second trimester: Secondary | ICD-10-CM

## 2018-01-17 LAB — POCT URINALYSIS DIPSTICK OB
Blood, UA: NEGATIVE
Glucose, UA: NEGATIVE
Ketones, UA: NEGATIVE
Leukocytes, UA: NEGATIVE
Nitrite, UA: NEGATIVE
POC,PROTEIN,UA: NEGATIVE

## 2018-01-17 NOTE — Patient Instructions (Signed)
Mindi JunkerBrittany A Gharibian, I greatly value your feedback.  If you receive a survey following your visit with us today, we appreciate you taking the time to fill it out.  Thanks, Cathie BeamsFran Cresenzo-Dishmon, CNM     Second Trimester of Pregnancy The second trimester is from week 14 through week 27 (months 4 through 6). The second trimester is often a time when you feel your best. Your body has adjusted to being pregnant, and you begin to feel better physically. Usually, morning sickness has lessened or quit completely, you may have more energy, and you may have an increase in appetite. The second trimester is also a time when the fetus is growing rapidly. At the end of the sixth month, the fetus is about 9 inches long and weighs about 1 pounds. You will likely begin to feel the baby move (quickening) between 16 and 20 weeks of pregnancy. Body changes during your second trimester Your body continues to go through many changes during your second trimester. The changes vary from woman to woman.  Your weight will continue to increase. You will notice your lower abdomen bulging out.  You may begin to get stretch marks on your hips, abdomen, and breasts.  You may develop headaches that can be relieved by medicines. The medicines should be approved by your health care provider.  You may urinate more often because the fetus is pressing on your bladder.  You may develop or continue to have heartburn as a result of your pregnancy.  You may develop constipation because certain hormones are causing the muscles that push waste through your intestines to slow down.  You may develop hemorrhoids or swollen, bulging veins (varicose veins).  You may have back pain. This is caused by: ? Weight gain. ? Pregnancy hormones that are relaxing the joints in your pelvis. ? A shift in weight and the muscles that support your balance.  Your breasts will continue to grow and they will continue to become tender.  Your gums may  bleed and may be sensitive to brushing and flossing.  Dark spots or blotches (chloasma, mask of pregnancy) may develop on your face. This will likely fade after the baby is born.  A dark line from your belly button to the pubic area (linea nigra) may appear. This will likely fade after the baby is born.  You may have changes in your hair. These can include thickening of your hair, rapid growth, and changes in texture. Some women also have hair loss during or after pregnancy, or hair that feels dry or thin. Your hair will most likely return to normal after your baby is born.  What to expect at prenatal visits During a routine prenatal visit:  You will be weighed to make sure you and the fetus are growing normally.  Your blood pressure will be taken.  Your abdomen will be measured to track your baby's growth.  The fetal heartbeat will be listened to.  Any test results from the previous visit will be discussed.  Your health care provider may ask you:  How you are feeling.  If you are feeling the baby move.  If you have had any abnormal symptoms, such as leaking fluid, bleeding, severe headaches, or abdominal cramping.  If you are using any tobacco products, including cigarettes, chewing tobacco, and electronic cigarettes.  If you have any questions.  Other tests that may be performed during your second trimester include:  Blood tests that check for: ? Low iron levels (anemia). ?  High blood sugar that affects pregnant women (gestational diabetes) between 20 and 28 weeks. ? Rh antibodies. This is to check for a protein on red blood cells (Rh factor).  Urine tests to check for infections, diabetes, or protein in the urine.  An ultrasound to confirm the proper growth and development of the baby.  An amniocentesis to check for possible genetic problems.  Fetal screens for spina bifida and Down syndrome.  HIV (human immunodeficiency virus) testing. Routine prenatal testing  includes screening for HIV, unless you choose not to have this test.  Follow these instructions at home: Medicines  Follow your health care provider's instructions regarding medicine use. Specific medicines may be either safe or unsafe to take during pregnancy.  Take a prenatal vitamin that contains at least 600 micrograms (mcg) of folic acid.  If you develop constipation, try taking a stool softener if your health care provider approves. Eating and drinking  Eat a balanced diet that includes fresh fruits and vegetables, whole grains, good sources of protein such as meat, eggs, or tofu, and low-fat dairy. Your health care provider will help you determine the amount of weight gain that is right for you.  Avoid raw meat and uncooked cheese. These carry germs that can cause birth defects in the baby.  If you have low calcium intake from food, talk to your health care provider about whether you should take a daily calcium supplement.  Limit foods that are high in fat and processed sugars, such as fried and sweet foods.  To prevent constipation: ? Drink enough fluid to keep your urine clear or pale yellow. ? Eat foods that are high in fiber, such as fresh fruits and vegetables, whole grains, and beans. Activity  Exercise only as directed by your health care provider. Most women can continue their usual exercise routine during pregnancy. Try to exercise for 30 minutes at least 5 days a week. Stop exercising if you experience uterine contractions.  Avoid heavy lifting, wear low heel shoes, and practice good posture.  A sexual relationship may be continued unless your health care provider directs you otherwise. Relieving pain and discomfort  Wear a good support bra to prevent discomfort from breast tenderness.  Take warm sitz baths to soothe any pain or discomfort caused by hemorrhoids. Use hemorrhoid cream if your health care provider approves.  Rest with your legs elevated if you have  leg cramps or low back pain.  If you develop varicose veins, wear support hose. Elevate your feet for 15 minutes, 3-4 times a day. Limit salt in your diet. Prenatal Care  Write down your questions. Take them to your prenatal visits.  Keep all your prenatal visits as told by your health care provider. This is important. Safety  Wear your seat belt at all times when driving.  Make a list of emergency phone numbers, including numbers for family, friends, the hospital, and police and fire departments. General instructions  Ask your health care provider for a referral to a local prenatal education class. Begin classes no later than the beginning of month 6 of your pregnancy.  Ask for help if you have counseling or nutritional needs during pregnancy. Your health care provider can offer advice or refer you to specialists for help with various needs.  Do not use hot tubs, steam rooms, or saunas.  Do not douche or use tampons or scented sanitary pads.  Do not cross your legs for long periods of time.  Avoid cat litter boxes and  soil used by cats. These carry germs that can cause birth defects in the baby and possibly loss of the fetus by miscarriage or stillbirth.  Avoid all smoking, herbs, alcohol, and unprescribed drugs. Chemicals in these products can affect the formation and growth of the baby.  Do not use any products that contain nicotine or tobacco, such as cigarettes and e-cigarettes. If you need help quitting, ask your health care provider.  Visit your dentist if you have not gone yet during your pregnancy. Use a soft toothbrush to brush your teeth and be gentle when you floss. Contact a health care provider if:  You have dizziness.  You have mild pelvic cramps, pelvic pressure, or nagging pain in the abdominal area.  You have persistent nausea, vomiting, or diarrhea.  You have a bad smelling vaginal discharge.  You have pain when you urinate. Get help right away if:  You  have a fever.  You are leaking fluid from your vagina.  You have spotting or bleeding from your vagina.  You have severe abdominal cramping or pain.  You have rapid weight gain or weight loss.  You have shortness of breath with chest pain.  You notice sudden or extreme swelling of your face, hands, ankles, feet, or legs.  You have not felt your baby move in over an hour.  You have severe headaches that do not go away when you take medicine.  You have vision changes. Summary  The second trimester is from week 14 through week 27 (months 4 through 6). It is also a time when the fetus is growing rapidly.  Your body goes through many changes during pregnancy. The changes vary from woman to woman.  Avoid all smoking, herbs, alcohol, and unprescribed drugs. These chemicals affect the formation and growth your baby.  Do not use any tobacco products, such as cigarettes, chewing tobacco, and e-cigarettes. If you need help quitting, ask your health care provider.  Contact your health care provider if you have any questions. Keep all prenatal visits as told by your health care provider. This is important. This information is not intended to replace advice given to you by your health care provider. Make sure you discuss any questions you have with your health care provider.      CHILDBIRTH CLASSES [phone] is the phone number for Pregnancy Classes or hospital tours at [place] will be referred to  [email or web address] for more information on childbirth classes  At this site you may register for classes. You may sign up for a waiting list if classes are full. Please SIGN UP FOR THIS!.   When the waiting list becomes long, sometimes new classes can be added.

## 2018-01-17 NOTE — Progress Notes (Signed)
  [id number] Estimated Date of Delivery: 07/13/18  Blood pressure 119/69, pulse 77, weight 219 lb (99.3 kg).   BP weight and urine results all reviewed and noted.  Please refer to the obstetrical flow sheet for the fundal height and fetal heart rate documentation:  Patient denies any bleeding and no rupture of membranes symptoms or regular contractions. Patient has some "knots" on left side.  Non tender, non discrete areas, seemingly in skin.  Co Exam w/ LHE, feels like it's just SQ fat or similar. Pt reassured.  All questions were answered.   Physical Assessment:   Vitals:   01/17/18 0838  BP: 119/69  Pulse: 77  Weight: 219 lb (99.3 kg)  Body mass index is 40.06 kg/m.        Physical Examination:   General appearance: Well appearing, and in no distress  Mental status: Alert, oriented to person, place, and time  Skin: Warm & dry  Cardiovascular: Normal heart rate noted  Respiratory: Normal respiratory effort, no distress  Abdomen: Soft, gravid, nontender  Pelvic: Cervical exam deferred         Extremities: Edema: None  Fetal Status:          Results for orders placed or performed in visit on 01/17/18 (from the past 24 hour(s))  POC Urinalysis Dipstick OB   Collection Time: 01/17/18  8:37 AM  Result Value Ref Range   Color, UA     Clarity, UA     Glucose, UA Negative Negative   Bilirubin, UA     Ketones, UA neg    Spec Grav, UA     Blood, UA neg    pH, UA     POC,PROTEIN,UA Negative Negative, Trace, Small (1+), Moderate (2+), Large (3+), 4+   Urobilinogen, UA     Nitrite, UA neg    Leukocytes, UA Negative Negative   Appearance     Odor       Orders Placed This Encounter  Procedures  . US OB Comp + 14 Wk  . POC Urinalysis Dipstick OB    Plan:  Continued routine obstetrical care,   Return in about 4 weeks (around 02/14/2018) for LROB, MV:[id number]:Anatomy. and 2nd IT

## 2018-02-01 NOTE — L&D Delivery Note (Signed)
  Delivery Note Pt progressed well until she felt some pressure and some mild variables.  Baby noted to be at +2 station. After a 2 ctx 2nd stage, at 10:24 PM a viable female was delivered via Vaginal, Spontaneous (Presentation: LOA;  ).  APGAR: ,8/9 ; weight pending. After 1 minute, the cord was clamped and cut. 40 units of pitocin diluted in 1000cc LR was infused rapidly IV.  The placenta separated spontaneously and delivered via CCT and maternal pushing effort.  It was inspected and appears to be intact with a 3 VC. **  Anesthesia:  epidural Episiotomy: None Lacerations:  none Suture Repair:  Est. Blood Loss (mL):  380  Mom to postpartum.  Baby to Couplet care / Skin to Skin.  Jacklyn Shell 06/22/2018, 10:38 PM

## 2018-02-02 ENCOUNTER — Encounter: Payer: Self-pay | Admitting: Women's Health

## 2018-02-02 ENCOUNTER — Ambulatory Visit (INDEPENDENT_AMBULATORY_CARE_PROVIDER_SITE_OTHER): Payer: Medicaid Other | Admitting: Women's Health

## 2018-02-02 VITALS — BP 129/84 | HR 86 | Wt 223.0 lb

## 2018-02-02 DIAGNOSIS — Z3482 Encounter for supervision of other normal pregnancy, second trimester: Secondary | ICD-10-CM

## 2018-02-02 DIAGNOSIS — Z363 Encounter for antenatal screening for malformations: Secondary | ICD-10-CM

## 2018-02-02 DIAGNOSIS — Z3A17 17 weeks gestation of pregnancy: Secondary | ICD-10-CM

## 2018-02-02 NOTE — Progress Notes (Signed)
   LOW-RISK PREGNANCY VISIT Patient name: Joanna [name] MRN [id number]  Date of birth: 1985-10-28 Chief Complaint:   Routine Prenatal Visit  History of Present Illness:   Joanna [name] is a 33 y.o. G77P1011 female at [redacted]w[redacted]d with an Estimated Date of Delivery: 07/13/18 being seen today for ongoing management of a low-risk pregnancy.  Today she reports some cramping. No vb/lof, uti s/s, denies abnormal discharge, itching/odor/irritation.   Contractions: Not present. Vag. Bleeding: None.  Movement: Present. denies leaking of fluid. Review of Systems:   Pertinent items are noted in HPI Denies abnormal vaginal discharge w/ itching/odor/irritation, headaches, visual changes, shortness of breath, chest pain, abdominal pain, severe nausea/vomiting, or problems with urination or bowel movements unless otherwise stated above. Pertinent History Reviewed:  Reviewed past medical,surgical, social, obstetrical and family history.  Reviewed problem list, medications and allergies. Physical Assessment:   Vitals:   02/02/18 1425  BP: 129/84  Pulse: 86  Weight: 223 lb (101.2 kg)  Body mass index is 40.79 kg/m.        Physical Examination:   General appearance: Well appearing, and in no distress  Mental status: Alert, oriented to person, place, and time  Skin: Warm & dry  Cardiovascular: Normal heart rate noted  Respiratory: Normal respiratory effort, no distress  Abdomen: Soft, gravid, nontender  Pelvic: Cervical exam deferred         Extremities: Edema: None  Fetal Status: Fetal Heart Rate (bpm): 140   Movement: Present    No results found for this or any previous visit (from the past 24 hour(s)).  Assessment & Plan:  1) Low-risk pregnancy G3P1011 at [redacted]w[redacted]d with an Estimated Date of Delivery: 07/13/18   2) H/O GHTN> hasn't started baby ASA yet, start 162mg  daily today   Meds: No orders of the defined types were placed in this encounter.  Labs/procedures today: 2nd IT  Plan:  Continue  routine obstetrical care   Reviewed: Preterm labor symptoms and general obstetric precautions including but not limited to vaginal bleeding, contractions, leaking of fluid and fetal movement were reviewed in detail with the patient.  All questions were answered  Follow-up: Return in about 2 weeks (around 02/16/2018) for LROB, [id number].  Orders Placed This Encounter  Procedures  . US OB Comp + 14 Wk  . INTEGRATED 2   Cheral Marker CNM, [facility] 02/02/2018 2:41 PM

## 2018-02-02 NOTE — Patient Instructions (Addendum)
Joanna [name], I greatly value your feedback.  If you receive a survey following your visit with us today, we appreciate you taking the time to fill it out.  Thanks, Joanna [name] Joanna [name], CNM, WHNP-BC  Begin taking 162mg  (two 81mg  tablets) baby aspirin daily to decrease risk of preeclampsia during pregnancy     Second Trimester of Pregnancy The second trimester is from week 14 through week 27 (months 4 through 6). The second trimester is often a time when you feel your best. Your body has adjusted to being pregnant, and you begin to feel better physically. Usually, morning sickness has lessened or quit completely, you may have more energy, and you may have an increase in appetite. The second trimester is also a time when the fetus is growing rapidly. At the end of the sixth month, the fetus is about 9 inches long and weighs about 1 pounds. You will likely begin to feel the baby move (quickening) between 16 and 20 weeks of pregnancy. Body changes during your second trimester Your body continues to go through many changes during your second trimester. The changes vary from woman to woman.  Your weight will continue to increase. You will notice your lower abdomen bulging out.  You may begin to get stretch marks on your hips, abdomen, and breasts.  You may develop headaches that can be relieved by medicines. The medicines should be approved by your health care provider.  You may urinate more often because the fetus is pressing on your bladder.  You may develop or continue to have heartburn as a result of your pregnancy.  You may develop constipation because certain hormones are causing the muscles that push waste through your intestines to slow down.  You may develop hemorrhoids or swollen, bulging veins (varicose veins).  You may have back pain. This is caused by: ? Weight gain. ? Pregnancy hormones that are relaxing the joints in your pelvis. ? A shift in weight and the muscles that support your  balance.  Your breasts will continue to grow and they will continue to become tender.  Your gums may bleed and may be sensitive to brushing and flossing.  Dark spots or blotches (chloasma, mask of pregnancy) may develop on your face. This will likely fade after the baby is born.  A dark line from your belly button to the pubic area (linea nigra) may appear. This will likely fade after the baby is born.  You may have changes in your hair. These can include thickening of your hair, rapid growth, and changes in texture. Some women also have hair loss during or after pregnancy, or hair that feels dry or thin. Your hair will most likely return to normal after your baby is born.  What to expect at prenatal visits During a routine prenatal visit:  You will be weighed to make sure you and the fetus are growing normally.  Your blood pressure will be taken.  Your abdomen will be measured to track your baby's growth.  The fetal heartbeat will be listened to.  Any test results from the previous visit will be discussed.  Your health care provider may ask you:  How you are feeling.  If you are feeling the baby move.  If you have had any abnormal symptoms, such as leaking fluid, bleeding, severe headaches, or abdominal cramping.  If you are using any tobacco products, including cigarettes, chewing tobacco, and electronic cigarettes.  If you have any questions.  Other tests that may be  performed during your second trimester include:  Blood tests that check for: ? Low iron levels (anemia). ? High blood sugar that affects pregnant women (gestational diabetes) between 31 and 28 weeks. ? Rh antibodies. This is to check for a protein on red blood cells (Rh factor).  Urine tests to check for infections, diabetes, or protein in the urine.  An ultrasound to confirm the proper growth and development of the baby.  An amniocentesis to check for possible genetic problems.  Fetal screens for  spina bifida and Down syndrome.  HIV (human immunodeficiency virus) testing. Routine prenatal testing includes screening for HIV, unless you choose not to have this test.  Follow these instructions at home: Medicines  Follow your health care provider's instructions regarding medicine use. Specific medicines may be either safe or unsafe to take during pregnancy.  Take a prenatal vitamin that contains at least 600 micrograms (mcg) of folic acid.  If you develop constipation, try taking a stool softener if your health care provider approves. Eating and drinking  Eat a balanced diet that includes fresh fruits and vegetables, whole grains, good sources of protein such as meat, eggs, or tofu, and low-fat dairy. Your health care provider will help you determine the amount of weight gain that is right for you.  Avoid raw meat and uncooked cheese. These carry germs that can cause birth defects in the baby.  If you have low calcium intake from food, talk to your health care provider about whether you should take a daily calcium supplement.  Limit foods that are high in fat and processed sugars, such as fried and sweet foods.  To prevent constipation: ? Drink enough fluid to keep your urine clear or pale yellow. ? Eat foods that are high in fiber, such as fresh fruits and vegetables, whole grains, and beans. Activity  Exercise only as directed by your health care provider. Most women can continue their usual exercise routine during pregnancy. Try to exercise for 30 minutes at least 5 days a week. Stop exercising if you experience uterine contractions.  Avoid heavy lifting, wear low heel shoes, and practice good posture.  A sexual relationship may be continued unless your health care provider directs you otherwise. Relieving pain and discomfort  Wear a good support bra to prevent discomfort from breast tenderness.  Take warm sitz baths to soothe any pain or discomfort caused by hemorrhoids.  Use hemorrhoid cream if your health care provider approves.  Rest with your legs elevated if you have leg cramps or low back pain.  If you develop varicose veins, wear support hose. Elevate your feet for 15 minutes, 3-4 times a day. Limit salt in your diet. Prenatal Care  Write down your questions. Take them to your prenatal visits.  Keep all your prenatal visits as told by your health care provider. This is important. Safety  Wear your seat belt at all times when driving.  Make a list of emergency phone numbers, including numbers for family, friends, the hospital, and police and fire departments. General instructions  Ask your health care provider for a referral to a local prenatal education class. Begin classes no later than the beginning of month 6 of your pregnancy.  Ask for help if you have counseling or nutritional needs during pregnancy. Your health care provider can offer advice or refer you to specialists for help with various needs.  Do not use hot tubs, steam rooms, or saunas.  Do not douche or use tampons or scented sanitary  pads.  Do not cross your legs for long periods of time.  Avoid cat litter boxes and soil used by cats. These carry germs that can cause birth defects in the baby and possibly loss of the fetus by miscarriage or stillbirth.  Avoid all smoking, herbs, alcohol, and unprescribed drugs. Chemicals in these products can affect the formation and growth of the baby.  Do not use any products that contain nicotine or tobacco, such as cigarettes and e-cigarettes. If you need help quitting, ask your health care provider.  Visit your dentist if you have not gone yet during your pregnancy. Use a soft toothbrush to brush your teeth and be gentle when you floss. Contact a health care provider if:  You have dizziness.  You have mild pelvic cramps, pelvic pressure, or nagging pain in the abdominal area.  You have persistent nausea, vomiting, or diarrhea.  You  have a bad smelling vaginal discharge.  You have pain when you urinate. Get help right away if:  You have a fever.  You are leaking fluid from your vagina.  You have spotting or bleeding from your vagina.  You have severe abdominal cramping or pain.  You have rapid weight gain or weight loss.  You have shortness of breath with chest pain.  You notice sudden or extreme swelling of your face, hands, ankles, feet, or legs.  You have not felt your baby move in over an hour.  You have severe headaches that do not go away when you take medicine.  You have vision changes. Summary  The second trimester is from week 14 through week 27 (months 4 through 6). It is also a time when the fetus is growing rapidly.  Your body goes through many changes during pregnancy. The changes vary from woman to woman.  Avoid all smoking, herbs, alcohol, and unprescribed drugs. These chemicals affect the formation and growth your baby.  Do not use any tobacco products, such as cigarettes, chewing tobacco, and e-cigarettes. If you need help quitting, ask your health care provider.  Contact your health care provider if you have any questions. Keep all prenatal visits as told by your health care provider. This is important. This information is not intended to replace advice given to you by your health care provider. Make sure you discuss any questions you have with your health care provider. Document Released: 01/12/2001 Document Revised: 06/26/2015 Document Reviewed: 03/21/2012 Elsevier Interactive Patient Education  2017 [employer].

## 2018-02-04 LAB — INTEGRATED 2
AFP MoM: 1.07
Alpha-Fetoprotein: 27.6 ng/mL
Crown Rump Length: 69.9 mm
DIA MoM: 0.92
DIA Value: 119.5 pg/mL
Estriol, Unconjugated: 1.41 ng/mL
Gest. Age on Collection Date: 13 weeks
Gestational Age: 16.9 weeks
Maternal Age at EDD: 32.9 yr
Nuchal Translucency (NT): 1.9 mm
Nuchal Translucency MoM: 1.2
Number of Fetuses: 1
PAPP-A MoM: 0.5
PAPP-A Value: 357.6 ng/mL
Test Results:: NEGATIVE
Weight: 221 [lb_av]
Weight: 221 [lb_av]
hCG MoM: 0.42
hCG Value: 9.7 IU/mL
uE3 MoM: 1.52

## 2018-02-14 ENCOUNTER — Encounter: Payer: Self-pay | Admitting: Obstetrics & Gynecology

## 2018-02-14 ENCOUNTER — Ambulatory Visit (INDEPENDENT_AMBULATORY_CARE_PROVIDER_SITE_OTHER): Payer: Medicaid Other

## 2018-02-14 ENCOUNTER — Ambulatory Visit (INDEPENDENT_AMBULATORY_CARE_PROVIDER_SITE_OTHER): Payer: Medicaid Other | Admitting: Obstetrics & Gynecology

## 2018-02-14 VITALS — BP 126/76 | HR 78 | Wt 220.5 lb

## 2018-02-14 DIAGNOSIS — F17200 Nicotine dependence, unspecified, uncomplicated: Secondary | ICD-10-CM

## 2018-02-14 DIAGNOSIS — Z363 Encounter for antenatal screening for malformations: Secondary | ICD-10-CM

## 2018-02-14 DIAGNOSIS — Z3402 Encounter for supervision of normal first pregnancy, second trimester: Secondary | ICD-10-CM

## 2018-02-14 DIAGNOSIS — Z331 Pregnant state, incidental: Secondary | ICD-10-CM

## 2018-02-14 DIAGNOSIS — Z3482 Encounter for supervision of other normal pregnancy, second trimester: Secondary | ICD-10-CM

## 2018-02-14 DIAGNOSIS — Z3A18 18 weeks gestation of pregnancy: Secondary | ICD-10-CM

## 2018-02-14 DIAGNOSIS — Z1389 Encounter for screening for other disorder: Secondary | ICD-10-CM

## 2018-02-14 LAB — POCT URINALYSIS DIPSTICK OB
Blood, UA: NEGATIVE
Glucose, UA: NEGATIVE
Ketones, UA: NEGATIVE
Leukocytes, UA: NEGATIVE
Nitrite, UA: NEGATIVE
POC,PROTEIN,UA: NEGATIVE

## 2018-02-14 MED ORDER — OMEPRAZOLE 20 MG PO CPDR: 20.0000 mg | DELAYED_RELEASE_CAPSULE | Freq: Every day | ORAL | 6 refills | Status: DC

## 2018-02-14 NOTE — Progress Notes (Signed)
[place] 18+5 wks,breech,anterior placenta gr 0,normal ovaries bilat,svp of fluid 5 cm,fhr 138 bpm,cx 4 cm,efw 283 g 77%,limited view of heart,spine and kidneys because of pt body habitus and fetal position,please have pt come back for additional images

## 2018-02-14 NOTE — Progress Notes (Signed)
   LOW-RISK PREGNANCY VISIT Patient name: Joanna [name] MRN [id number]  Date of birth: 1985/10/28 Chief Complaint:   Routine Prenatal Visit ([place] today; requests med for heartburn)  History of Present Illness:   Joanna [name] is a 33 y.o. G9P1011 female at [redacted]w[redacted]d with an Estimated Date of Delivery: 07/13/18 being seen today for ongoing management of a low-risk pregnancy.  Today she reports heartburn. Contractions: Not present. Vag. Bleeding: None.  Movement: Present. denies leaking of fluid. Review of Systems:   Pertinent items are noted in HPI Denies abnormal vaginal discharge w/ itching/odor/irritation, headaches, visual changes, shortness of breath, chest pain, abdominal pain, severe nausea/vomiting, or problems with urination or bowel movements unless otherwise stated above. Pertinent History Reviewed:  Reviewed past medical,surgical, social, obstetrical and family history.  Reviewed problem list, medications and allergies. Physical Assessment:   Vitals:   02/14/18 1207  BP: 126/76  Pulse: 78  Weight: 220 lb 8 oz (100 kg)  Body mass index is 40.33 kg/m.        Physical Examination:   General appearance: Well appearing, and in no distress  Mental status: Alert, oriented to person, place, and time  Skin: Warm & dry  Cardiovascular: Normal heart rate noted  Respiratory: Normal respiratory effort, no distress  Abdomen: Soft, gravid, nontender  Pelvic: Cervical exam deferred         Extremities: Edema: Trace  Fetal Status:     Movement: Present    Results for orders placed or performed in visit on 02/14/18 (from the past 24 hour(s))  POC Urinalysis Dipstick OB   Collection Time: 02/14/18 12:08 PM  Result Value Ref Range   Color, UA     Clarity, UA     Glucose, UA Negative Negative   Bilirubin, UA     Ketones, UA neg    Spec Grav, UA     Blood, UA neg    pH, UA     POC,PROTEIN,UA Negative Negative, Trace, Small (1+), Moderate (2+), Large (3+), 4+   Urobilinogen, UA       Nitrite, UA neg    Leukocytes, UA Negative Negative   Appearance     Odor      Assessment & Plan:  1) Low-risk pregnancy G3P1011 at [redacted]w[redacted]d with an Estimated Date of Delivery: 07/13/18      Meds:  Meds ordered this encounter  Medications  . omeprazole (PRILOSEC) 20 MG capsule    Sig: Take 1 capsule (20 mg total) by mouth daily. 1 tablet a day    Dispense:  30 capsule    Refill:  6   Labs/procedures today: sonogram  Plan:  Continue routine obstetrical care repeat scan 4 weeks full anatomy could not be done  Reviewed: Preterm labor symptoms and general obstetric precautions including but not limited to vaginal bleeding, contractions, leaking of fluid and fetal movement were reviewed in detail with the patient.  All questions were answered  Follow-up: Return in about 4 weeks (around 03/14/2018) for LROB, 20 week sono.  Orders Placed This Encounter  Procedures  . US OB Follow Up  . POC Urinalysis Dipstick OB   Lazaro Arms  02/14/2018 12:19 PM

## 2018-02-16 ENCOUNTER — Encounter: Payer: Self-pay | Admitting: *Deleted

## 2018-02-16 ENCOUNTER — Telehealth: Payer: Self-pay | Admitting: *Deleted

## 2018-02-16 ENCOUNTER — Other Ambulatory Visit: Payer: Self-pay | Admitting: Advanced Practice Midwife

## 2018-02-16 MED ORDER — ONDANSETRON 4 MG PO TBDP: 4.0000 mg | ORAL_TABLET | Freq: Four times a day (QID) | ORAL | 2 refills | Status: DC | PRN

## 2018-02-16 NOTE — Telephone Encounter (Signed)
Patient requesting nausea medication be sent to [facility].

## 2018-02-16 NOTE — Progress Notes (Signed)
ZOFRAN FOR NAUSEA

## 2018-02-17 ENCOUNTER — Encounter: Payer: Self-pay | Admitting: Advanced Practice Midwife

## 2018-03-03 ENCOUNTER — Telehealth: Payer: Self-pay | Admitting: *Deleted

## 2018-03-03 NOTE — Telephone Encounter (Signed)
No taking tamiflu will not prevent the flu or strep, it really doesn't help much even if you have it

## 2018-03-03 NOTE — Telephone Encounter (Signed)
Spoke with pt giving her Dr. Forestine Chute recommendations. Pt voiced understanding. JSY

## 2018-03-03 NOTE — Telephone Encounter (Signed)
Spoke with pt. Pt's daughter has flu and strep. Pt has itchy throat, sneezing and dry cough. She feels fine right now but wants to take precautions. Please advise. Does she need Tamiflu? I advised to practice good handwashing. Thanks!! JSY

## 2018-03-14 ENCOUNTER — Other Ambulatory Visit: Payer: Self-pay | Admitting: Obstetrics & Gynecology

## 2018-03-14 DIAGNOSIS — Z3482 Encounter for supervision of other normal pregnancy, second trimester: Secondary | ICD-10-CM

## 2018-03-14 DIAGNOSIS — IMO0002 Reserved for concepts with insufficient information to code with codable children: Secondary | ICD-10-CM

## 2018-03-14 DIAGNOSIS — Z0489 Encounter for examination and observation for other specified reasons: Secondary | ICD-10-CM

## 2018-03-15 ENCOUNTER — Encounter: Payer: Medicaid Other | Admitting: Obstetrics and Gynecology

## 2018-03-15 ENCOUNTER — Other Ambulatory Visit: Payer: Medicaid Other

## 2018-04-03 ENCOUNTER — Encounter: Payer: Self-pay | Admitting: Women's Health

## 2018-04-03 ENCOUNTER — Ambulatory Visit (INDEPENDENT_AMBULATORY_CARE_PROVIDER_SITE_OTHER): Payer: Medicaid Other | Admitting: Women's Health

## 2018-04-03 ENCOUNTER — Ambulatory Visit (INDEPENDENT_AMBULATORY_CARE_PROVIDER_SITE_OTHER): Payer: Medicaid Other

## 2018-04-03 VITALS — BP 135/82 | HR 84 | Wt 225.0 lb

## 2018-04-03 DIAGNOSIS — Z331 Pregnant state, incidental: Secondary | ICD-10-CM

## 2018-04-03 DIAGNOSIS — IMO0002 Reserved for concepts with insufficient information to code with codable children: Secondary | ICD-10-CM

## 2018-04-03 DIAGNOSIS — Z362 Encounter for other antenatal screening follow-up: Secondary | ICD-10-CM

## 2018-04-03 DIAGNOSIS — Z3A25 25 weeks gestation of pregnancy: Secondary | ICD-10-CM

## 2018-04-03 DIAGNOSIS — Z0489 Encounter for examination and observation for other specified reasons: Secondary | ICD-10-CM

## 2018-04-03 DIAGNOSIS — Z1389 Encounter for screening for other disorder: Secondary | ICD-10-CM

## 2018-04-03 DIAGNOSIS — Z3482 Encounter for supervision of other normal pregnancy, second trimester: Secondary | ICD-10-CM

## 2018-04-03 DIAGNOSIS — F17200 Nicotine dependence, unspecified, uncomplicated: Secondary | ICD-10-CM

## 2018-04-03 LAB — POCT URINALYSIS DIPSTICK OB
Blood, UA: NEGATIVE
Glucose, UA: NEGATIVE
Ketones, UA: NEGATIVE
Leukocytes, UA: NEGATIVE
Nitrite, UA: NEGATIVE
POC,PROTEIN,UA: NEGATIVE

## 2018-04-03 NOTE — Patient Instructions (Addendum)
Joanna [name], I greatly value your feedback.  If you receive a survey following your visit with [place] today, we appreciate you taking the time to fill it out.  Thanks, Joanna [name], CNM, WHNP-BC   You will have your sugar test next visit.  Please do not eat or drink anything after midnight the night before you come, not even water.  You will be here for at least two hours.     Call the office [phone]) or go to [facility] if:  You begin to have strong, frequent contractions  Your water breaks.  Sometimes it is a big gush of fluid, sometimes it is just a trickle that keeps getting your panties wet or running down your legs  You have vaginal bleeding.  It is normal to have a small amount of spotting if your cervix was checked.   You don't feel your baby moving like normal.  If you don't, get you something to eat and drink and lay down and focus on feeling your baby move.   If your baby is still not moving like normal, you should call the office or go to [facility].  Second Trimester of Pregnancy The second trimester is from week 13 through week 28, months 4 through 6. The second trimester is often a time when you feel your best. Your body has also adjusted to being pregnant, and you begin to feel better physically. Usually, morning sickness has lessened or quit completely, you may have more energy, and you may have an increase in appetite. The second trimester is also a time when the fetus is growing rapidly. At the end of the sixth month, the fetus is about 9 inches long and weighs about 1 pounds. You will likely begin to feel the baby move (quickening) between 18 and 20 weeks of the pregnancy. BODY CHANGES Your body goes through many changes during pregnancy. The changes vary from woman to woman.   Your weight will continue to increase. You will notice your lower abdomen bulging out.  You may begin to get stretch marks on your hips, abdomen, and breasts.  You may develop  headaches that can be relieved by medicines approved by your health care provider.  You may urinate more often because the fetus is pressing on your bladder.  You may develop or continue to have heartburn as a result of your pregnancy.  You may develop constipation because certain hormones are causing the muscles that push waste through your intestines to slow down.  You may develop hemorrhoids or swollen, bulging veins (varicose veins).  You may have back pain because of the weight gain and pregnancy hormones relaxing your joints between the bones in your pelvis and as a result of a shift in weight and the muscles that support your balance.  Your breasts will continue to grow and be tender.  Your gums may bleed and may be sensitive to brushing and flossing.  Dark spots or blotches (chloasma, mask of pregnancy) may develop on your face. This will likely fade after the baby is born.  A dark line from your belly button to the pubic area (linea nigra) may appear. This will likely fade after the baby is born.  You may have changes in your hair. These can include thickening of your hair, rapid growth, and changes in texture. Some women also have hair loss during or after pregnancy, or hair that feels dry or thin. Your hair will most likely return to normal after your  baby is born. WHAT TO EXPECT AT YOUR PRENATAL VISITS During a routine prenatal visit:  You will be weighed to make sure you and the fetus are growing normally.  Your blood pressure will be taken.  Your abdomen will be measured to track your baby's growth.  The fetal heartbeat will be listened to.  Any test results from the previous visit will be discussed. Your health care provider may ask you:  How you are feeling.  If you are feeling the baby move.  If you have had any abnormal symptoms, such as leaking fluid, bleeding, severe headaches, or abdominal cramping.  If you have any questions. Other tests that may be  performed during your second trimester include:  Blood tests that check for:  Low iron levels (anemia).  Gestational diabetes (between 24 and 28 weeks).  Rh antibodies.  Urine tests to check for infections, diabetes, or protein in the urine.  An ultrasound to confirm the proper growth and development of the baby.  An amniocentesis to check for possible genetic problems.  Fetal screens for spina bifida and Down syndrome. HOME CARE INSTRUCTIONS   Avoid all smoking, herbs, alcohol, and unprescribed drugs. These chemicals affect the formation and growth of the baby.  Follow your health care provider's instructions regarding medicine use. There are medicines that are either safe or unsafe to take during pregnancy.  Exercise only as directed by your health care provider. Experiencing uterine cramps is a good sign to stop exercising.  Continue to eat regular, healthy meals.  Wear a good support bra for breast tenderness.  Do not use hot tubs, steam rooms, or saunas.  Wear your seat belt at all times when driving.  Avoid raw meat, uncooked cheese, cat litter boxes, and soil used by cats. These carry germs that can cause birth defects in the baby.  Take your prenatal vitamins.  Try taking a stool softener (if your health care provider approves) if you develop constipation. Eat more high-fiber foods, such as fresh vegetables or fruit and whole grains. Drink plenty of fluids to keep your urine clear or pale yellow.  Take warm sitz baths to soothe any pain or discomfort caused by hemorrhoids. Use hemorrhoid cream if your health care provider approves.  If you develop varicose veins, wear support hose. Elevate your feet for 15 minutes, 3-4 times a day. Limit salt in your diet.  Avoid heavy lifting, wear low heel shoes, and practice good posture.  Rest with your legs elevated if you have leg cramps or low back pain.  Visit your dentist if you have not gone yet during your pregnancy.  Use a soft toothbrush to brush your teeth and be gentle when you floss.  A sexual relationship may be continued unless your health care provider directs you otherwise.  Continue to go to all your prenatal visits as directed by your health care provider. SEEK MEDICAL CARE IF:   You have dizziness.  You have mild pelvic cramps, pelvic pressure, or nagging pain in the abdominal area.  You have persistent nausea, vomiting, or diarrhea.  You have a bad smelling vaginal discharge.  You have pain with urination. SEEK IMMEDIATE MEDICAL CARE IF:   You have a fever.  You are leaking fluid from your vagina.  You have spotting or bleeding from your vagina.  You have severe abdominal cramping or pain.  You have rapid weight gain or loss.  You have shortness of breath with chest pain.  You notice sudden or extreme   swelling of your face, hands, ankles, feet, or legs.  You have not felt your baby move in over an hour.  You have severe headaches that do not go away with medicine.  You have vision changes. Document Released: 01/12/2001 Document Revised: 01/23/2013 Document Reviewed: 03/21/2012 [facility] Patient Information 2015 [place], [place]. This information is not intended to replace advice given to you by your health care provider. Make sure you discuss any questions you have with your health care provider.   Sciatica Rehab Ask your health care provider which exercises are safe for you. Do exercises exactly as told by your health care provider and adjust them as directed. It is normal to feel mild stretching, pulling, tightness, or discomfort as you do these exercises, but you should stop right away if you feel sudden pain or your pain gets worse.Do not begin these exercises until told by your health care provider. Stretching and range of motion exercises These exercises warm up your muscles and joints and improve the movement and flexibility of your hips and your back. These exercises  also help to relieve pain, numbness, and tingling. Exercise A: Sciatic nerve glide 1. Sit in a chair with your head facing down toward your chest. Place your hands behind your back. Let your shoulders slump forward. 2. Slowly straighten one of your knees while you tilt your head back as if you are looking toward the ceiling. Only straighten your leg as far as you can without making your symptoms worse. 3. Hold for __________ seconds. 4. Slowly return to the starting position. 5. Repeat with your other leg. Repeat __________ times. Complete this exercise __________ times a day. Exercise B: Knee to chest with hip adduction and internal rotation  1. Lie on your back on a firm surface with both legs straight. 2. Bend one of your knees and move it up toward your chest until you feel a gentle stretch in your lower back and buttock. Then, move your knee toward the shoulder that is on the opposite side from your leg. ? Hold your leg in this position by holding onto the front of your knee. 3. Hold for __________ seconds. 4. Slowly return to the starting position. 5. Repeat with your other leg. Repeat __________ times. Complete this exercise __________ times a day. Exercise C: Prone extension on elbows  1. Lie on your abdomen on a firm surface. A bed may be too soft for this exercise. 2. Prop yourself up on your elbows. 3. Use your arms to help lift your chest up until you feel a gentle stretch in your abdomen and your lower back. ? This will place some of your body weight on your elbows. If this is uncomfortable, try stacking pillows under your chest. ? Your hips should stay down, against the surface that you are lying on. Keep your hip and back muscles relaxed. 4. Hold for __________ seconds. 5. Slowly relax your upper body and return to the starting position. Repeat __________ times. Complete this exercise __________ times a day. Strengthening exercises These exercises build strength and  endurance in your back. Endurance is the ability to use your muscles for a long time, even after they get tired. Exercise D: Pelvic tilt 1. Lie on your back on a firm surface. Bend your knees and keep your feet flat. 2. Tense your abdominal muscles. Tip your pelvis up toward the ceiling and flatten your lower back into the floor. ? To help with this exercise, you may place a small towel  under your lower back and try to push your back into the towel. 3. Hold for __________ seconds. 4. Let your muscles relax completely before you repeat this exercise. Repeat __________ times. Complete this exercise __________ times a day. Exercise E: Alternating arm and leg raises  1. Get on your hands and knees on a firm surface. If you are on a hard floor, you may want to use padding to cushion your knees, such as an exercise mat. 2. Line up your arms and legs. Your hands should be below your shoulders, and your knees should be below your hips. 3. Lift your left leg behind you. At the same time, raise your right arm and straighten it in front of you. ? Do not lift your leg higher than your hip. ? Do not lift your arm higher than your shoulder. ? Keep your abdominal and back muscles tight. ? Keep your hips facing the ground. ? Do not arch your back. ? Keep your balance carefully, and do not hold your breath. 4. Hold for __________ seconds. 5. Slowly return to the starting position and repeat with your right leg and your left arm. Repeat __________ times. Complete this exercise __________ times a day. Posture and body mechanics  Body mechanics refers to the movements and positions of your body while you do your daily activities. Posture is part of body mechanics. Good posture and healthy body mechanics can help to relieve stress in your body's tissues and joints. Good posture means that your spine is in its natural S-curve position (your spine is neutral), your shoulders are pulled back slightly, and your head  is not tipped forward. The following are general guidelines for applying improved posture and body mechanics to your everyday activities. Standing   When standing, keep your spine neutral and your feet about hip-width apart. Keep a slight bend in your knees. Your ears, shoulders, and hips should line up.  When you do a task in which you stand in one place for a long time, place one foot up on a stable object that is 2-4 inches (5-10 cm) high, such as a footstool. This helps keep your spine neutral. Sitting   When sitting, keep your spine neutral and keep your feet flat on the floor. Use a footrest, if necessary, and keep your thighs parallel to the floor. Avoid rounding your shoulders, and avoid tilting your head forward.  When working at a desk or a computer, keep your desk at a height where your hands are slightly lower than your elbows. Slide your chair under your desk so you are close enough to maintain good posture.  When working at a computer, place your monitor at a height where you are looking straight ahead and you do not have to tilt your head forward or downward to look at the screen. Resting   When lying down and resting, avoid positions that are most painful for you.  If you have pain with activities such as sitting, bending, stooping, or squatting (flexion-based activities), lie in a position in which your body does not bend very much. For example, avoid curling up on your side with your arms and knees near your chest (fetal position).  If you have pain with activities such as standing for a long time or reaching with your arms (extension-based activities), lie with your spine in a neutral position and bend your knees slightly. Try the following positions: ? Lying on your side with a pillow between your knees. ? Lying on  your back with a pillow under your knees. Lifting   When lifting objects, keep your feet at least shoulder-width apart and tighten your abdominal  muscles.  Bend your knees and hips and keep your spine neutral. It is important to lift using the strength of your legs, not your back. Do not lock your knees straight out.  Always ask for help to lift heavy or awkward objects. This information is not intended to replace advice given to you by your health care provider. Make sure you discuss any questions you have with your health care provider. Document Released: 01/18/2005 Document Revised: 09/25/2015 Document Reviewed: 10/04/2014 Elsevier Interactive Patient Education  2019 [employer].

## 2018-04-03 NOTE — Progress Notes (Addendum)
[place] 25+4 wks,breech,fhr 121 bpm,cx 3.5 cm,anterior placenta gr 0,svp of fluid 6.7 cm,normal ovaries bilat,efw 832 g 41%,anatomy of the heart and kidneys complete,no obvious abnormalities,unable to visualize spine because of fetal position,pt will come back for additional images.

## 2018-04-03 NOTE — Progress Notes (Signed)
   LOW-RISK PREGNANCY VISIT Patient name: Joanna [name] MRN [id number]  Date of birth: [birth year] Chief Complaint:   Routine Prenatal Visit ([place] today)  History of Present Illness:   Joanna [name] is a 33 y.o. G65P1011 female at [redacted]w[redacted]d with an Estimated Date of Delivery: 07/13/18 being seen today for ongoing management of a low-risk pregnancy.  Today she reports sciatica, moles under Lt breast irritated by underwire, 'bumps in skin' Contractions: Not present. Vag. Bleeding: None.  Movement: Present. denies leaking of fluid. Review of Systems:   Pertinent items are noted in HPI Denies abnormal vaginal discharge w/ itching/odor/irritation, headaches, visual changes, shortness of breath, chest pain, abdominal pain, severe nausea/vomiting, or problems with urination or bowel movements unless otherwise stated above. Pertinent History Reviewed:  Reviewed past medical,surgical, social, obstetrical and family history.  Reviewed problem list, medications and allergies. Physical Assessment:   Vitals:   04/03/18 1135  BP: 135/82  Pulse: 84  Weight: 225 lb (102.1 kg)  Body mass index is 41.15 kg/m.        Physical Examination:   General appearance: Well appearing, and in no distress  Mental status: Alert, oriented to person, place, and time  Skin: Warm & dry, multiple nevi- recommended seeing dermatologist. Deep firm bump Lt lateral abd, feels like possible lipoma  Cardiovascular: Normal heart rate noted  Respiratory: Normal respiratory effort, no distress  Abdomen: Soft, gravid, nontender  Pelvic: Cervical exam deferred         Extremities: Edema: Trace  Fetal Status:     Movement: Present    [place] 25+4 wks,breech,fhr 121 bpm,cx 3.5 cm,anterior placenta gr 0,svp of fluid 6.7 cm,normal ovaries bilat,efw 832 g 41%,anatomy of the heart and kidneys complete,no obvious abnormalities,unable to visualize spine because of fetal position,pt will come back for additional images after Dr.appt  Results  for orders placed or performed in visit on 04/03/18 (from the past 24 hour(s))  POC Urinalysis Dipstick OB   Collection Time: 04/03/18 11:38 AM  Result Value Ref Range   Color, UA     Clarity, UA     Glucose, UA Negative Negative   Bilirubin, UA     Ketones, UA neg    Spec Grav, UA     Blood, UA neg    pH, UA     POC,PROTEIN,UA Negative Negative, Trace, Small (1+), Moderate (2+), Large (3+), 4+   Urobilinogen, UA     Nitrite, UA neg    Leukocytes, UA Negative Negative   Appearance     Odor      Assessment & Plan:  1) Low-risk pregnancy G3P1011 at [redacted]w[redacted]d with an Estimated Date of Delivery: 07/13/18   2) Sciatica, gave printed exercises  3) Multiple nevi> recommended going to dermatologist  4) Possible lipoma> monitor, let us know if any changes   Meds: No orders of the defined types were placed in this encounter.  Labs/procedures today: f/u anatomy u/s, still need images of spine, will try again before she leaves  Plan:  Continue routine obstetrical care   Reviewed: Preterm labor symptoms and general obstetric precautions including but not limited to vaginal bleeding, contractions, leaking of fluid and fetal movement were reviewed in detail with the patient.  All questions were answered  Follow-up: Return in about 3 weeks (around 04/24/2018) for LROB w/ LHE, PN2.  Orders Placed This Encounter  Procedures  . POC Urinalysis Dipstick OB   Cheral Marker CNM, [facility] 04/03/2018 12:18 PM

## 2018-04-12 ENCOUNTER — Telehealth: Payer: Self-pay | Admitting: Women's Health

## 2018-04-12 ENCOUNTER — Telehealth: Payer: Self-pay | Admitting: *Deleted

## 2018-04-12 NOTE — Telephone Encounter (Signed)
Pt wanted to talk to you about swelling she was having/

## 2018-04-12 NOTE — Telephone Encounter (Signed)
Patient states she is having some swelling on days she works as she works 12 hours shifts.  Wants to know if it will be covered under her FMLA if she has to miss work.  Informed patient it would be under FMLA if needed.  Verbalized understanding.

## 2018-04-24 ENCOUNTER — Other Ambulatory Visit: Payer: Self-pay | Admitting: Women's Health

## 2018-04-24 ENCOUNTER — Telehealth: Payer: Self-pay | Admitting: *Deleted

## 2018-04-24 DIAGNOSIS — Z0489 Encounter for examination and observation for other specified reasons: Secondary | ICD-10-CM

## 2018-04-24 DIAGNOSIS — IMO0002 Reserved for concepts with insufficient information to code with codable children: Secondary | ICD-10-CM

## 2018-04-24 NOTE — Telephone Encounter (Signed)
Pt informed that visitors and children are not allowed to come to appt with pt. Advised that pt will come inside to check in and then will be sent back to her car until provider is ready for her. Pt denies fever, cough, SOB, muscle pain, diarrhea, rash, vomiting, abdominal pain, red eye, weakness, bruising or bleeding, joint pain, or severe headache. Pt denies being in contact with anyone with confirmed or suspected COVID-19 in the last month. 

## 2018-04-25 ENCOUNTER — Other Ambulatory Visit: Payer: Self-pay

## 2018-04-25 ENCOUNTER — Other Ambulatory Visit: Payer: Medicaid Other

## 2018-04-25 ENCOUNTER — Encounter: Payer: Self-pay | Admitting: Obstetrics & Gynecology

## 2018-04-25 ENCOUNTER — Ambulatory Visit (INDEPENDENT_AMBULATORY_CARE_PROVIDER_SITE_OTHER): Payer: Medicaid Other

## 2018-04-25 ENCOUNTER — Ambulatory Visit (INDEPENDENT_AMBULATORY_CARE_PROVIDER_SITE_OTHER): Payer: Medicaid Other | Admitting: Obstetrics & Gynecology

## 2018-04-25 VITALS — BP 145/87 | HR 84 | Wt 229.0 lb

## 2018-04-25 DIAGNOSIS — Z23 Encounter for immunization: Secondary | ICD-10-CM | POA: Diagnosis not present

## 2018-04-25 DIAGNOSIS — Z1389 Encounter for screening for other disorder: Secondary | ICD-10-CM

## 2018-04-25 DIAGNOSIS — Z3403 Encounter for supervision of normal first pregnancy, third trimester: Secondary | ICD-10-CM

## 2018-04-25 DIAGNOSIS — Z0489 Encounter for examination and observation for other specified reasons: Secondary | ICD-10-CM

## 2018-04-25 DIAGNOSIS — Z3483 Encounter for supervision of other normal pregnancy, third trimester: Secondary | ICD-10-CM

## 2018-04-25 DIAGNOSIS — Z3402 Encounter for supervision of normal first pregnancy, second trimester: Secondary | ICD-10-CM

## 2018-04-25 DIAGNOSIS — F17200 Nicotine dependence, unspecified, uncomplicated: Secondary | ICD-10-CM

## 2018-04-25 DIAGNOSIS — Z3A28 28 weeks gestation of pregnancy: Secondary | ICD-10-CM

## 2018-04-25 DIAGNOSIS — O99333 Smoking (tobacco) complicating pregnancy, third trimester: Secondary | ICD-10-CM | POA: Diagnosis not present

## 2018-04-25 DIAGNOSIS — Z362 Encounter for other antenatal screening follow-up: Secondary | ICD-10-CM | POA: Diagnosis not present

## 2018-04-25 DIAGNOSIS — IMO0002 Reserved for concepts with insufficient information to code with codable children: Secondary | ICD-10-CM

## 2018-04-25 DIAGNOSIS — Z331 Pregnant state, incidental: Secondary | ICD-10-CM

## 2018-04-25 LAB — POCT URINALYSIS DIPSTICK OB
Blood, UA: NEGATIVE
Glucose, UA: NEGATIVE
Ketones, UA: NEGATIVE
Leukocytes, UA: NEGATIVE
Nitrite, UA: NEGATIVE
POC,PROTEIN,UA: NEGATIVE

## 2018-04-25 NOTE — Progress Notes (Signed)
[place] 28+5 wks,cephalic,fhr 150 bpm,bilat adnexa's wnl,anterior placenta gr 0,afi 12 cm,efw 1395 g 64 %,anatomy of the spine complete,no obvious abnormalities

## 2018-04-25 NOTE — Progress Notes (Signed)
Patient ID: [name], female   DOB: 1985/03/30, 33 y.o.   MRN: [id number]   LOW-RISK PREGNANCY VISIT Patient name: Joanna [name] MRN [id number]  Date of birth: 08-13-1985 Chief Complaint:   Routine Prenatal Visit (PN2 and U/S)  History of Present Illness:   Joanna [name] is a 33 y.o. G71P1011 female at [redacted]w[redacted]d with an Estimated Date of Delivery: 07/13/18 being seen today for ongoing management of a low-risk pregnancy.  Today she reports no complaints. Contractions: Irregular. Vag. Bleeding: None.  Movement: Present. denies leaking of fluid. Review of Systems:   Pertinent items are noted in HPI Denies abnormal vaginal discharge w/ itching/odor/irritation, headaches, visual changes, shortness of breath, chest pain, abdominal pain, severe nausea/vomiting, or problems with urination or bowel movements unless otherwise stated above. Pertinent History Reviewed:  Reviewed past medical,surgical, social, obstetrical and family history.  Reviewed problem list, medications and allergies. Physical Assessment:   Vitals:   04/25/18 1036  BP: (!) 145/87  Pulse: 84  Weight: 229 lb (103.9 kg)  Body mass index is 41.88 kg/m.        Physical Examination:   General appearance: Well appearing, and in no distress  Mental status: Alert, oriented to person, place, and time  Skin: Warm & dry  Cardiovascular: Normal heart rate noted  Respiratory: Normal respiratory effort, no distress  Abdomen: Soft, gravid, nontender  Pelvic: Cervical exam deferred         Extremities: Edema: None  Fetal Status:     Movement: Present    Results for orders placed or performed in visit on 04/25/18 (from the past 24 hour(s))  POC Urinalysis Dipstick OB   Collection Time: 04/25/18 10:40 AM  Result Value Ref Range   Color, UA     Clarity, UA     Glucose, UA Negative Negative   Bilirubin, UA     Ketones, UA neg    Spec Grav, UA     Blood, UA neg    pH, UA     POC,PROTEIN,UA Negative Negative, Trace, Small  (1+), Moderate (2+), Large (3+), 4+   Urobilinogen, UA     Nitrite, UA neg    Leukocytes, UA Negative Negative   Appearance     Odor      Assessment & Plan:  1) Low-risk pregnancy G3P1011 at [redacted]w[redacted]d with an Estimated Date of Delivery: 07/13/18   2) Sonogram is normal today   Meds: No orders of the defined types were placed in this encounter.  Labs/procedures today: PN2, sonogram is normal  Plan:  Continue routine obstetrical care , BP creeping up, if >150/95 consisitently call for appointment prior to scheduled time  Reviewed: Preterm labor symptoms and general obstetric precautions including but not limited to vaginal bleeding, contractions, leaking of fluid and fetal movement were reviewed in detail with the patient.  All questions were answered  Follow-up: No follow-ups on file.  Orders Placed This Encounter  Procedures  . Tdap vaccine greater than or equal to 7yo IM  . POC Urinalysis Dipstick OB   Lazaro Arms  04/25/2018 10:51 AM

## 2018-04-26 LAB — GLUCOSE TOLERANCE, 2 HOURS W/ 1HR
Glucose, 1 hour: 145 mg/dL (ref 65–179)
Glucose, 2 hour: 114 mg/dL (ref 65–152)
Glucose, Fasting: 87 mg/dL (ref 65–91)

## 2018-04-26 LAB — HIV ANTIBODY (ROUTINE TESTING W REFLEX): HIV Screen 4th Generation wRfx: NONREACTIVE

## 2018-04-26 LAB — CBC
Hematocrit: 30.5 % — ABNORMAL LOW (ref 34.0–46.6)
Hemoglobin: 10.1 g/dL — ABNORMAL LOW (ref 11.1–15.9)
MCH: 28.5 pg (ref 26.6–33.0)
MCHC: 33.1 g/dL (ref 31.5–35.7)
MCV: 86 fL (ref 79–97)
Platelets: 179 10*3/uL (ref 150–450)
RBC: 3.54 x10E6/uL — ABNORMAL LOW (ref 3.77–5.28)
RDW: 12.5 % (ref 11.7–15.4)
WBC: 11.3 10*3/uL — ABNORMAL HIGH (ref 3.4–10.8)

## 2018-04-26 LAB — RPR: RPR Ser Ql: NONREACTIVE

## 2018-04-26 LAB — ANTIBODY SCREEN: Antibody Screen: NEGATIVE

## 2018-04-27 ENCOUNTER — Telehealth: Payer: Self-pay | Admitting: *Deleted

## 2018-04-27 NOTE — Telephone Encounter (Signed)
Patient called wanting to know if her results from her sugar test has came back yet. Please advise [phone]

## 2018-04-27 NOTE — Telephone Encounter (Signed)
Attempted to call patient back, line is busy.

## 2018-05-02 ENCOUNTER — Telehealth: Payer: Self-pay | Admitting: Obstetrics & Gynecology

## 2018-05-02 ENCOUNTER — Encounter: Payer: Self-pay | Admitting: *Deleted

## 2018-05-02 NOTE — Telephone Encounter (Signed)
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 Please advise.

## 2018-05-11 ENCOUNTER — Other Ambulatory Visit: Payer: Medicaid Other | Admitting: *Deleted

## 2018-05-11 ENCOUNTER — Other Ambulatory Visit: Payer: Self-pay

## 2018-05-11 ENCOUNTER — Telehealth: Payer: Self-pay | Admitting: *Deleted

## 2018-05-11 DIAGNOSIS — Z1389 Encounter for screening for other disorder: Secondary | ICD-10-CM

## 2018-05-11 DIAGNOSIS — Z3483 Encounter for supervision of other normal pregnancy, third trimester: Secondary | ICD-10-CM

## 2018-05-11 LAB — POCT URINALYSIS DIPSTICK OB
Blood, UA: NEGATIVE
Glucose, UA: NEGATIVE
Ketones, UA: NEGATIVE
Leukocytes, UA: NEGATIVE
Nitrite, UA: NEGATIVE
POC,PROTEIN,UA: NEGATIVE

## 2018-05-11 NOTE — Telephone Encounter (Signed)
Spoke with patient. She will come by to do urine sample this afternoon.

## 2018-05-11 NOTE — Progress Notes (Signed)
Pt in for urine dip only per Drenda Freeze. Urine dip negative. Patient informed.

## 2018-05-11 NOTE — Telephone Encounter (Signed)
Cloudy urine is often from heavier vaginal discharge d/t pregnancy hormones.  Her urgency sx could also be pregnancy related, not infectious, so pt will need to have her urine dipped (can just come by office and give [place] a clean catch specimen)

## 2018-05-11 NOTE — Telephone Encounter (Signed)
Patient states her urine is cloudy and she is having the urgenecy to go but when she goes, only a little urine comes out.  No burning, pain or pressure at this time.  Please advise.  Please send pt mychart message or to [place].

## 2018-05-22 ENCOUNTER — Telehealth: Payer: Self-pay | Admitting: *Deleted

## 2018-05-22 NOTE — Telephone Encounter (Signed)
Left message letting pt know no visitors or children at tomorrow's appt. Also, if pt has come in contact with someone in the last month that has been confirmed or suspected of having Covid-19 or if she is experiencing any symptoms, don't come to office; just call and reschedule appt. JSY

## 2018-05-23 ENCOUNTER — Other Ambulatory Visit: Payer: Self-pay

## 2018-05-23 ENCOUNTER — Encounter: Payer: Self-pay | Admitting: Obstetrics & Gynecology

## 2018-05-23 ENCOUNTER — Ambulatory Visit (INDEPENDENT_AMBULATORY_CARE_PROVIDER_SITE_OTHER): Payer: Medicaid Other | Admitting: Obstetrics & Gynecology

## 2018-05-23 VITALS — BP 140/88 | HR 84 | Temp 97.8°F | Wt 230.4 lb

## 2018-05-23 DIAGNOSIS — Z3403 Encounter for supervision of normal first pregnancy, third trimester: Secondary | ICD-10-CM

## 2018-05-23 DIAGNOSIS — Z331 Pregnant state, incidental: Secondary | ICD-10-CM

## 2018-05-23 DIAGNOSIS — Z1389 Encounter for screening for other disorder: Secondary | ICD-10-CM

## 2018-05-23 DIAGNOSIS — Z3A32 32 weeks gestation of pregnancy: Secondary | ICD-10-CM

## 2018-05-23 LAB — POCT URINALYSIS DIPSTICK OB
Blood, UA: NEGATIVE
Glucose, UA: NEGATIVE
Ketones, UA: NEGATIVE
Leukocytes, UA: NEGATIVE
Nitrite, UA: NEGATIVE
POC,PROTEIN,UA: NEGATIVE

## 2018-05-23 NOTE — Progress Notes (Signed)
   LOW-RISK PREGNANCY VISIT Patient name: Joanna [name] MRN [id number]  Date of birth: Sep 07, 1985 Chief Complaint:   Routine Prenatal Visit  History of Present Illness:   Joanna [name] is a 33 y.o. G31P1011 female at [redacted]w[redacted]d with an Estimated Date of Delivery: 07/13/18 being seen today for ongoing management of a low-risk pregnancy.  Today she reports no complaints. Contractions: Irregular.  .  Movement: Present. denies leaking of fluid. Review of Systems:   Pertinent items are noted in HPI Denies abnormal vaginal discharge w/ itching/odor/irritation, headaches, visual changes, shortness of breath, chest pain, abdominal pain, severe nausea/vomiting, or problems with urination or bowel movements unless otherwise stated above. Pertinent History Reviewed:  Reviewed past medical,surgical, social, obstetrical and family history.  Reviewed problem list, medications and allergies. Physical Assessment:   Vitals:   05/23/18 0915  BP: 140/88  Pulse: 84  Temp: 97.8 F (36.6 C)  Weight: 230 lb 6.4 oz (104.5 kg)  Body mass index is 42.14 kg/m.        Physical Examination:   General appearance: Well appearing, and in no distress  Mental status: Alert, oriented to person, place, and time  Skin: Warm & dry  Cardiovascular: Normal heart rate noted  Respiratory: Normal respiratory effort, no distress  Abdomen: Soft, gravid, nontender  Pelvic: Cervical exam deferred         Extremities: Edema: None  Fetal Status: Fetal Heart Rate (bpm): 138 Fundal Height: 32 cm Movement: Present    Results for orders placed or performed in visit on 05/23/18 (from the past 24 hour(s))  POC Urinalysis Dipstick OB   Collection Time: 05/23/18  9:22 AM  Result Value Ref Range   Color, UA     Clarity, UA     Glucose, UA Negative Negative   Bilirubin, UA     Ketones, UA neg    Spec Grav, UA     Blood, UA neg    pH, UA     POC,PROTEIN,UA Negative Negative, Trace, Small (1+), Moderate (2+), Large (3+), 4+   Urobilinogen, UA     Nitrite, UA neg    Leukocytes, UA Negative Negative   Appearance     Odor      Assessment & Plan:  1) Low-risk pregnancy G3P1011 at [redacted]w[redacted]d with an Estimated Date of Delivery: 07/13/18   2) 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  Labs/procedures today:   Plan:  Continue routine obstetrical care , in office 2 weeks  Reviewed: Term labor symptoms and general obstetric precautions including but not limited to vaginal bleeding, contractions, leaking of fluid and fetal movement were reviewed in detail with the patient.  All questions were answered  Follow-up: Return in about 2 weeks (around 06/06/2018) for in office, LROB, not webex.  Orders Placed This Encounter  Procedures  . POC Urinalysis Dipstick OB   Lazaro Arms  05/23/2018 9:48 AM

## 2018-05-31 ENCOUNTER — Telehealth: Payer: Medicaid Other | Admitting: Family

## 2018-05-31 ENCOUNTER — Telehealth: Payer: Self-pay | Admitting: Obstetrics & Gynecology

## 2018-05-31 DIAGNOSIS — J069 Acute upper respiratory infection, unspecified: Secondary | ICD-10-CM

## 2018-05-31 MED ORDER — FLUTICASONE PROPIONATE 50 MCG/ACT NA SUSP: 2.0000 | Freq: Every day | NASAL | 6 refills | Status: DC

## 2018-05-31 NOTE — Telephone Encounter (Signed)
Spoke with patient advised that she call her dentist for emergency visit. Patient voiced understanding and had no other questions at this time.

## 2018-05-31 NOTE — Telephone Encounter (Signed)
Pt states that she has a chipped tooth and in a lot of pain. She states Tylenol is not helping and would like to see if something can be called in for pain.

## 2018-05-31 NOTE — Progress Notes (Signed)
We are sorry you are not feeling well.  Here is how we plan to help!  Based on what you have shared with me, it looks like you may have a viral upper respiratory infection.  Upper respiratory infections are caused by a large number of viruses; however, rhinovirus is the most common cause.   Approximately 5 minutes was spent documenting and reviewing patient's chart.   Symptoms vary from person to person, with common symptoms including sore throat, cough, and fatigue or lack of energy.  A low-grade fever of up to 100.4 may present, but is often uncommon.  Symptoms vary however, and are closely related to a person's age or underlying illnesses.  The most common symptoms associated with an upper respiratory infection are nasal discharge or congestion, cough, sneezing, headache and pressure in the ears and face.  These symptoms usually persist for about 3 to 10 days, but can last up to 2 weeks.  It is important to know that upper respiratory infections do not cause serious illness or complications in most cases.    Upper respiratory infections can be transmitted from person to person, with the most common method of transmission being a person's hands.  The virus is able to live on the skin and can infect other persons for up to 2 hours after direct contact.  Also, these can be transmitted when someone coughs or sneezes; thus, it is important to cover the mouth to reduce this risk.  To keep the spread of the illness at bay, good hand hygiene is very important.  This is an infection that is most likely caused by a virus. There are no specific treatments other than to help you with the symptoms until the infection runs its course.  We are sorry you are not feeling well.  Here is how we plan to help!   For nasal congestion, you may use an oral decongestants such as Mucinex D or if you have glaucoma or high blood pressure use plain Mucinex.  Saline nasal spray or nasal drops can help and can safely be used as  often as needed for congestion.  For your congestion, I have prescribed Fluticasone nasal spray one spray in each nostril twice a day  If you do not have a history of heart disease, hypertension, diabetes or thyroid disease, prostate/bladder issues or glaucoma, you may also use Sudafed to treat nasal congestion.  It is highly recommended that you consult with a pharmacist or your primary care physician to ensure this medication is safe for you to take.      If you have a sore or scratchy throat, use a saltwater gargle-  to  teaspoon of salt dissolved in a 4-ounce to 8-ounce glass of warm water.  Gargle the solution for approximately 15-30 seconds and then spit.  It is important not to swallow the solution.  You can also use throat lozenges/cough drops and Chloraseptic spray to help with throat pain or discomfort.  Warm or cold liquids can also be helpful in relieving throat pain.  For headache, pain or general discomfort, you can use Ibuprofen or Tylenol as directed.   Some authorities believe that zinc sprays or the use of Echinacea may shorten the course of your symptoms.   HOME CARE . Only take medications as instructed by your medical team. . Be sure to drink plenty of fluids. Water is fine as well as fruit juices, sodas and electrolyte beverages. You may want to stay away from caffeine or alcohol.  If you are nauseated, try taking small sips of liquids. How do you know if you are getting enough fluid? Your urine should be a pale yellow or almost colorless. . Get rest. . Taking a steamy shower or using a humidifier may help nasal congestion and ease sore throat pain. You can place a towel over your head and breathe in the steam from hot water coming from a faucet. . Using a saline nasal spray works much the same way. . Cough drops, hard candies and sore throat lozenges may ease your cough. . Avoid close contacts especially the very young and the elderly . Cover your mouth if you cough or  sneeze . Always remember to wash your hands.   GET HELP RIGHT AWAY IF: . You develop worsening fever. . If your symptoms do not improve within 10 days . You develop yellow or green discharge from your nose over 3 days. . You have coughing fits . You develop a severe head ache or visual changes. . You develop shortness of breath, difficulty breathing or start having chest pain . Your symptoms persist after you have completed your treatment plan  MAKE SURE YOU   Understand these instructions.  Will watch your condition.  Will get help right away if you are not doing well or get worse.  Your e-visit answers were reviewed by a board certified advanced clinical practitioner to complete your personal care plan. Depending upon the condition, your plan could have included both over the counter or prescription medications. Please review your pharmacy choice. If there is a problem, you may call our nursing hot line at and have the prescription routed to another pharmacy. Your safety is important to [place]. If you have drug allergies check your prescription carefully.   You can use MyChart to ask questions about today's visit, request a non-urgent call back, or ask for a work or school excuse for 24 hours related to this e-Visit. If it has been greater than 24 hours you will need to follow up with your provider, or enter a new e-Visit to address those concerns. You will get an e-mail in the next two days asking about your experience.  I hope that your e-visit has been valuable and will speed your recovery. Thank you for using e-visits.

## 2018-06-01 ENCOUNTER — Encounter: Payer: Self-pay | Admitting: *Deleted

## 2018-06-04 ENCOUNTER — Encounter (HOSPITAL_COMMUNITY): Payer: Self-pay

## 2018-06-04 ENCOUNTER — Inpatient Hospital Stay (HOSPITAL_COMMUNITY)
Admission: AD | Admit: 2018-06-04 | Discharge: 2018-06-04 | Disposition: A | Discharge status: Home / Self Care | Payer: Medicaid Other | Source: Ambulatory Visit | Attending: Obstetrics and Gynecology | Admitting: Obstetrics and Gynecology

## 2018-06-04 ENCOUNTER — Other Ambulatory Visit: Payer: Self-pay

## 2018-06-04 DIAGNOSIS — Z3A34 34 weeks gestation of pregnancy: Secondary | ICD-10-CM | POA: Diagnosis not present

## 2018-06-04 DIAGNOSIS — K047 Periapical abscess without sinus: Secondary | ICD-10-CM

## 2018-06-04 DIAGNOSIS — Z79899 Other long term (current) drug therapy: Secondary | ICD-10-CM | POA: Diagnosis not present

## 2018-06-04 DIAGNOSIS — O133 Gestational [pregnancy-induced] hypertension without significant proteinuria, third trimester: Secondary | ICD-10-CM | POA: Diagnosis not present

## 2018-06-04 DIAGNOSIS — O26893 Other specified pregnancy related conditions, third trimester: Secondary | ICD-10-CM | POA: Insufficient documentation

## 2018-06-04 DIAGNOSIS — F17210 Nicotine dependence, cigarettes, uncomplicated: Secondary | ICD-10-CM | POA: Insufficient documentation

## 2018-06-04 DIAGNOSIS — O99333 Smoking (tobacco) complicating pregnancy, third trimester: Secondary | ICD-10-CM | POA: Insufficient documentation

## 2018-06-04 DIAGNOSIS — Z880 Allergy status to penicillin: Secondary | ICD-10-CM | POA: Diagnosis not present

## 2018-06-04 DIAGNOSIS — Z7982 Long term (current) use of aspirin: Secondary | ICD-10-CM | POA: Insufficient documentation

## 2018-06-04 DIAGNOSIS — Z7951 Long term (current) use of inhaled steroids: Secondary | ICD-10-CM | POA: Insufficient documentation

## 2018-06-04 DIAGNOSIS — F17200 Nicotine dependence, unspecified, uncomplicated: Secondary | ICD-10-CM

## 2018-06-04 LAB — URINALYSIS, ROUTINE W REFLEX MICROSCOPIC
Bilirubin Urine: NEGATIVE
Glucose, UA: NEGATIVE mg/dL
Hgb urine dipstick: NEGATIVE
Ketones, ur: NEGATIVE mg/dL
Nitrite: NEGATIVE
Protein, ur: NEGATIVE mg/dL
Specific Gravity, Urine: 1.005 (ref 1.005–1.030)
pH: 7 (ref 5.0–8.0)

## 2018-06-04 LAB — COMPREHENSIVE METABOLIC PANEL
ALT: 29 U/L (ref 0–44)
AST: 22 U/L (ref 15–41)
Albumin: 2.8 g/dL — ABNORMAL LOW (ref 3.5–5.0)
Alkaline Phosphatase: 99 U/L (ref 38–126)
Anion gap: 12 (ref 5–15)
BUN: 8 mg/dL (ref 6–20)
CO2: 18 mmol/L — ABNORMAL LOW (ref 22–32)
Calcium: 9.1 mg/dL (ref 8.9–10.3)
Chloride: 106 mmol/L (ref 98–111)
Creatinine, Ser: 0.59 mg/dL (ref 0.44–1.00)
GFR calc Af Amer: >60 mL/min (ref >60)
GFR calc non Af Amer: >60 mL/min (ref >60)
Glucose, Bld: 103 mg/dL — ABNORMAL HIGH (ref 70–99)
Potassium: 3.9 mmol/L (ref 3.5–5.1)
Sodium: 136 mmol/L (ref 135–145)
Total Bilirubin: 0.2 mg/dL — ABNORMAL LOW (ref 0.3–1.2)
Total Protein: 6.3 g/dL — ABNORMAL LOW (ref 6.5–8.1)

## 2018-06-04 LAB — CBC
HCT: 31.9 % — ABNORMAL LOW (ref 36.0–46.0)
Hemoglobin: 10.4 g/dL — ABNORMAL LOW (ref 12.0–15.0)
MCH: 28.3 pg (ref 26.0–34.0)
MCHC: 32.6 g/dL (ref 30.0–36.0)
MCV: 86.9 fL (ref 80.0–100.0)
Platelets: 193 10*3/uL (ref 150–400)
RBC: 3.67 MIL/uL — ABNORMAL LOW (ref 3.87–5.11)
RDW: 13.8 % (ref 11.5–15.5)
WBC: 15.3 10*3/uL — ABNORMAL HIGH (ref 4.0–10.5)
nRBC: 0 % (ref 0.0–0.2)

## 2018-06-04 LAB — PROTEIN / CREATININE RATIO, URINE
Creatinine, Urine: 33.31 mg/dL
Protein Creatinine Ratio: 0.27 mg/mg{Cre} — ABNORMAL HIGH (ref 0.00–0.15)
Total Protein, Urine: 9 mg/dL

## 2018-06-04 MED ORDER — OXYCODONE-ACETAMINOPHEN 5-325 MG PO TABS: 1.0000 | ORAL_TABLET | Freq: Four times a day (QID) | ORAL | 0 refills | Status: DC | PRN

## 2018-06-04 MED ORDER — OXYCODONE-ACETAMINOPHEN 5-325 MG PO TABS
2.0000 | ORAL_TABLET | Freq: Once | ORAL | Status: AC
  Administered 2018-06-04: 2 via ORAL
  Filled 2018-06-04: qty 2

## 2018-06-04 NOTE — MAU Note (Signed)
Have tooth abscess upper L tooth. Started Clindamycin Friday night 300mg  TID. Tooth is really hurting and Tylenol is not helping. Was making my b/p high and head hurting so went to [facility]. I work at [employer]. Signed out AMA as I wanted to be seen "at my doctor". Have GHTN and no meds. BP was 179/98 tonight at [facility]. Denies vag d/c or LOF. Some pelvic pressure and ? occ ctx.

## 2018-06-04 NOTE — MAU Provider Note (Signed)
Chief Complaint:  Hypertension and Abscess   First Provider Initiated Contact with Patient 06/04/18 0202      HPI: Joanna [name] is a 33 y.o. G3P1011 at [redacted]w[redacted]d by early ultrasound pt of [facility] who presents to maternity admissions reporting pain in her abscessed tooth causing headache and pain behind her eye on the same side.  She is being monitored for GHTN in this pregnancy and her BP at work today was 170s/90s.  She went to [facility] first because it is closer to her work but she left because she did not want to have an evaluation there since her doctors are here in [place].  She reports the tooth is in the back on the left upper side and her pain starts at the tooth and radiates up into her left eye socket and into her left temple.  Movement makes the pain worse.  Nothing makes it better. She is taking Clindamycin x 2 days prescribed by the doctor she works with.  Tylenol is not helping her pain. Her OB office was closed when the pain started Friday so she has not talked with them.  Her next prenatal appt is on Tuesday. She reports good fetal movement, denies abdominal pain, LOF, vaginal bleeding, vaginal itching/burning, urinary symptoms, h/a, dizziness, n/v, or fever/chills.    HPI  Past Medical History: Past Medical History:  Diagnosis Date  . 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 Normal delivery 02/14/2012  . Pericardial cyst   . Pregnancy induced hypertension   . Thoracic cyst     Past obstetric history: OB History  Gravida Para Term Preterm AB Living  0 1 1  SAB TAB Ectopic Multiple Live Births  1 0 0 0 1    # Outcome Date GA Lbr Len/2nd Weight Sex Delivery Anes PTL Lv  3 Current           2 Term 02/14/12 [redacted]w[redacted]d 12:35 / 03:12 3650 g F Vag-Spont EPI N LIV     Complications: Gestational hypertension  1 SAB 02/01/09             Birth Comments: blighted ovum    Past Surgical History: Past Surgical History:  Procedure Laterality Date  .  TONSILLECTOMY AND ADENOIDECTOMY      Family History: Family History  Problem Relation Age of Onset  . Hepatitis Mother   . Diabetes Mother        borderline  . Heart disease Mother   . Depression Mother   . Hyperlipidemia Father   . AAA (abdominal aortic aneurysm) Paternal Grandfather   . Graves' disease Maternal Aunt     Social History: Social History   Tobacco Use  . Smoking status: Current Every Day Smoker    Packs/day: 0.25    Years: 10.00    Pack years: 2.50    Types: Cigarettes  . Smokeless tobacco: Never Used  . Tobacco comment: 2-3 cigarettes daily, pt trying to quit  Substance Use Topics  . Alcohol use: Never    Frequency: Never  . Drug use: No    Allergies:  Allergies  Allergen Reactions  . Amoxicillin Swelling and Other (See Comments)    Tongue/facial swelling, burning sensation Has patient had a PCN reaction causing immediate rash, facial/tongue/throat swelling, SOB or lightheadedness with hypotension: No Has patient had a PCN reaction causing severe rash involving mucus membranes or skin necrosis: No Has patient had a PCN reaction that required hospitalization: No  Has patient had a PCN reaction occurring within the last 10 years: No If all of the above answers are "NO", then may proceed with Cephalosporin use.   . Latex Rash    rash    Meds:  Medications Prior to Admission  Medication Sig Dispense Refill Last Dose  . acetaminophen (TYLENOL) 500 MG tablet Take 500 mg by mouth every 6 (six) hours as needed for mild pain.   06/03/2018 at Unknown time  . aspirin 81 MG chewable tablet Chew 162 mg by mouth daily.   06/03/2018 at Unknown time  . omeprazole (PRILOSEC) 20 MG capsule Take 1 capsule (20 mg total) by mouth daily. 1 tablet a day 30 capsule 6 06/03/2018 at Unknown time  . Prenatal Vit-Fe Fumarate-FA (PRENATAL VITAMIN PO) Take by mouth.   06/03/2018 at Unknown time  . witch hazel-glycerin (TUCKS) pad Apply 1 application topically as needed for itching.      . fluticasone (FLONASE) 50 MCG/ACT nasal spray Place 2 sprays into both nostrils daily. 16 g 6   . ondansetron (ZOFRAN ODT) 4 MG disintegrating tablet Take 1 tablet (4 mg total) by mouth every 6 (six) hours as needed for nausea. (Patient not taking: Reported on 04/03/2018) 30 tablet 2 Not Taking    ROS:  Review of Systems  Constitutional: Negative for chills, fatigue and fever.  HENT: Positive for dental problem.   Eyes: Positive for pain. Negative for visual disturbance.  Respiratory: Negative for shortness of breath.   Cardiovascular: Negative for chest pain.  Gastrointestinal: Negative for abdominal pain, nausea and vomiting.  Genitourinary: Negative for difficulty urinating, dysuria, flank pain, pelvic pain, vaginal bleeding, vaginal discharge and vaginal pain.  Neurological: Positive for headaches. Negative for dizziness.  Psychiatric/Behavioral: Negative.      I have reviewed patient's Past Medical Hx, Surgical Hx, Family Hx, Social Hx, medications and allergies.   Physical Exam   Patient Vitals for the past 24 hrs:  BP Temp Pulse Resp Height Weight  06/04/18 0215 140/86 - 77 - - -  06/04/18 0200 (!) 155/88 - 76 - - -  06/04/18 0145 135/81 - 78 - - -  06/04/18 0136 (!) 143/86 - 78 - - -  06/04/18 0054 (!) 152/85 - 84 - - -  06/04/18 0039 (!) 142/87 - 87 - - -  06/04/18 0032 - 98.3 F (36.8 C) - 18 5\' 2"  (1.575 m) 105.2 kg   Constitutional: Well-developed, well-nourished female in moderate distress.  HEENT:  Physical Exam  HENT:  Mouth/Throat: Dental abscesses present.     Cardiovascular: normal rate Respiratory: normal effort GI: Abd soft, non-tender, gravid appropriate for gestational age.  MS: Extremities nontender, no edema, normal ROM Neurologic: Alert and oriented x 4.  GU: Neg CVAT.  PELVIC EXAM: Deferred     FHT:  Baseline 135 , moderate variability, accelerations present, no decelerations Contractions: None on toco or to palpation   Labs: Results  for orders placed or performed during the hospital encounter of 06/04/18 (from the past 24 hour(s))  Urinalysis, Routine w reflex microscopic     Status: Abnormal   Collection Time: 06/04/18 12:45 AM  Result Value Ref Range   Color, Urine YELLOW YELLOW   APPearance HAZY (A) CLEAR   Specific Gravity, Urine 1.005 1.005 - 1.030   pH 7.0 5.0 - 8.0   Glucose, UA NEGATIVE NEGATIVE mg/dL   Hgb urine dipstick NEGATIVE NEGATIVE   Bilirubin Urine NEGATIVE NEGATIVE   Ketones, ur NEGATIVE NEGATIVE mg/dL  Protein, ur NEGATIVE NEGATIVE mg/dL   Nitrite NEGATIVE NEGATIVE   Leukocytes,Ua LARGE (A) NEGATIVE   RBC / HPF 0-5 0 - 5 RBC/hpf   WBC, UA 21-50 0 - 5 WBC/hpf   Bacteria, UA FEW (A) NONE SEEN   Squamous Epithelial / LPF 11-20 0 - 5  Protein / creatinine ratio, urine     Status: Abnormal   Collection Time: 06/04/18 12:45 AM  Result Value Ref Range   Creatinine, Urine 33.31 mg/dL   Total Protein, Urine 9 mg/dL   Protein Creatinine Ratio 0.27 (H) 0.00 - 0.15 mg/mg[Cre]  CBC     Status: Abnormal   Collection Time: 06/04/18  1:19 AM  Result Value Ref Range   WBC 15.3 (H) 4.0 - 10.5 K/uL   RBC 3.67 (L) 3.87 - 5.11 MIL/uL   Hemoglobin 10.4 (L) 12.0 - 15.0 g/dL   HCT 09.8 (L) 11.9 - 14.7 %   MCV 86.9 80.0 - 100.0 fL   MCH 28.3 26.0 - 34.0 pg   MCHC 32.6 30.0 - 36.0 g/dL   RDW 82.9 56.2 - 13.0 %   Platelets 193 150 - 400 K/uL   nRBC 0.0 0.0 - 0.2 %  Comprehensive metabolic panel     Status: Abnormal   Collection Time: 06/04/18  1:19 AM  Result Value Ref Range   Sodium 136 135 - 145 mmol/L   Potassium 3.9 3.5 - 5.1 mmol/L   Chloride 106 98 - 111 mmol/L   CO2 18 (L) 22 - 32 mmol/L   Glucose, Bld 103 (H) 70 - 99 mg/dL   BUN 8 6 - 20 mg/dL   Creatinine, Ser 8.65 0.44 - 1.00 mg/dL   Calcium 9.1 8.9 - 78.4 mg/dL   Total Protein 6.3 (L) 6.5 - 8.1 g/dL   Albumin 2.8 (L) 3.5 - 5.0 g/dL   AST 22 15 - 41 U/L   ALT 29 0 - 44 U/L   Alkaline Phosphatase 99 38 - 126 U/L   Total Bilirubin 0.2 (L)  0.3 - 1.2 mg/dL   GFR calc non Af Amer >60 >60 mL/min   GFR calc Af Amer >60 >60 mL/min   Anion gap 12 5 - 15   AB/Positive/-- (11/26 1201)  Imaging:  No results found.  MAU Course/MDM: Orders Placed This Encounter  Procedures  . OB Urine Culture  . Urinalysis, Routine w reflex microscopic  . CBC  . Comprehensive metabolic panel  . Protein / creatinine ratio, urine    Meds ordered this encounter  Medications  . oxyCODONE-acetaminophen (PERCOCET/ROXICET) 5-325 MG per tablet 2 tablet  . oxyCODONE-acetaminophen (PERCOCET/ROXICET) 5-325 MG tablet    Sig: Take 1-2 tablets by mouth every 6 (six) hours as needed for severe pain.    Dispense:  15 tablet    Refill:  0    Order Specific Question:   Supervising Provider    Answer:   CONSTANT, PEGGY [[id number]]     NST reviewed and reactive. No OB complaints. Pt with h/a c/w worsening tooth abscess, no s/sx of preeclampsia Preeclampsia labs wnl, P/C ratio 0.27.  Will continue to monitor outpatient.  Pt to continue course of abx.  Add Percocet 1-2 tabs Q 6 hours PRN x 15 tabs. Pt to f/u on Tuesday with Dr Despina Hidden as scheduled.  Pt discharge with strict return precautions.    Assessment: 1. Dental abscess   2. Smoker   3. Gestational hypertension, third trimester     Plan: Discharge home with preeclampsia precautions  Labor precautions and fetal kick counts Follow-up Information    [facility] Follow up.   Why:  Call for an appointment on Monday or keep appointment on Tuesday, 06/06/18.  Return to MAU as needed for signs of labor or emergencies. Contact information: [address] [place] [phone]         Allergies as of 06/04/2018      Reactions   Amoxicillin Swelling, Other (See Comments)   Tongue/facial swelling, burning sensation Has patient had a PCN reaction causing immediate rash, facial/tongue/throat swelling, SOB or lightheadedness with hypotension: No Has patient had a PCN  reaction causing severe rash involving mucus membranes or skin necrosis: No Has patient had a PCN reaction that required hospitalization: No Has patient had a PCN reaction occurring within the last 10 years: No If all of the above answers are "NO", then may proceed with Cephalosporin use.   Latex Rash   rash      Medication List    STOP taking these medications   acetaminophen 500 MG tablet Commonly known as:  TYLENOL   fluticasone 50 MCG/ACT nasal spray Commonly known as:  FLONASE   ondansetron 4 MG disintegrating tablet Commonly known as:  Zofran ODT     TAKE these medications   aspirin 81 MG chewable tablet Chew 162 mg by mouth daily.   omeprazole 20 MG capsule Commonly known as:  PRILOSEC Take 1 capsule (20 mg total) by mouth daily. 1 tablet a day   oxyCODONE-acetaminophen 5-325 MG tablet Commonly known as:  PERCOCET/ROXICET Take 1-2 tablets by mouth every 6 (six) hours as needed for severe pain.   PRENATAL VITAMIN PO Take by mouth.   witch hazel-glycerin pad Commonly known as:  TUCKS Apply 1 application topically as needed for itching.       Sharen CounterLisa Leftwich-Kirby Certified Nurse-Midwife 06/04/2018 2:20 AM

## 2018-06-05 ENCOUNTER — Encounter: Payer: Self-pay | Admitting: *Deleted

## 2018-06-05 LAB — CULTURE, OB URINE

## 2018-06-06 ENCOUNTER — Encounter: Payer: Medicaid Other | Admitting: Obstetrics & Gynecology

## 2018-06-08 ENCOUNTER — Encounter: Payer: Self-pay | Admitting: *Deleted

## 2018-06-12 ENCOUNTER — Encounter: Payer: Self-pay | Admitting: *Deleted

## 2018-06-12 ENCOUNTER — Encounter: Payer: Medicaid Other | Admitting: Obstetrics & Gynecology

## 2018-06-13 ENCOUNTER — Other Ambulatory Visit: Payer: Self-pay

## 2018-06-13 ENCOUNTER — Encounter: Payer: Self-pay | Admitting: Obstetrics & Gynecology

## 2018-06-13 ENCOUNTER — Ambulatory Visit (INDEPENDENT_AMBULATORY_CARE_PROVIDER_SITE_OTHER): Payer: Medicaid Other | Admitting: Obstetrics & Gynecology

## 2018-06-13 VITALS — BP 142/93 | HR 89 | Wt 233.5 lb

## 2018-06-13 DIAGNOSIS — O133 Gestational [pregnancy-induced] hypertension without significant proteinuria, third trimester: Secondary | ICD-10-CM

## 2018-06-13 DIAGNOSIS — Z3A35 35 weeks gestation of pregnancy: Secondary | ICD-10-CM

## 2018-06-13 DIAGNOSIS — R8290 Unspecified abnormal findings in urine: Secondary | ICD-10-CM

## 2018-06-13 DIAGNOSIS — Z331 Pregnant state, incidental: Secondary | ICD-10-CM

## 2018-06-13 DIAGNOSIS — O0993 Supervision of high risk pregnancy, unspecified, third trimester: Secondary | ICD-10-CM

## 2018-06-13 DIAGNOSIS — Z1389 Encounter for screening for other disorder: Secondary | ICD-10-CM

## 2018-06-13 LAB — OB RESULTS CONSOLE GC/CHLAMYDIA: Gonorrhea: NEGATIVE

## 2018-06-13 LAB — POCT URINALYSIS DIPSTICK OB
Blood, UA: NEGATIVE
Glucose, UA: NEGATIVE
Ketones, UA: NEGATIVE
Leukocytes, UA: NEGATIVE
Nitrite, UA: POSITIVE

## 2018-06-13 LAB — OB RESULTS CONSOLE GBS: GBS: POSITIVE

## 2018-06-13 NOTE — Progress Notes (Signed)
   HIGH-RISK PREGNANCY VISIT Patient name: Joanna [name] MRN [id number]  Date of birth: 1985-03-24 Chief Complaint:   Routine Prenatal Visit  History of Present Illness:   Joanna [name] is a 33 y.o. G57P1011 female at [redacted]w[redacted]d with an Estimated Date of Delivery: 07/13/18 being seen today for ongoing management of a high-risk pregnancy complicated by gestational HTN.  Today she reports pelvic pressure. Contractions: Irregular. Vag. Bleeding: None.  Movement: Present. denies leaking of fluid.  Review of Systems:   Pertinent items are noted in HPI Denies abnormal vaginal discharge w/ itching/odor/irritation, headaches, visual changes, shortness of breath, chest pain, abdominal pain, severe nausea/vomiting, or problems with urination or bowel movements unless otherwise stated above. Pertinent History Reviewed:  Reviewed past medical,surgical, social, obstetrical and family history.  Reviewed problem list, medications and allergies. Physical Assessment:   Vitals:   06/13/18 1237 06/13/18 1242  BP: (!) 149/92 (!) 142/93  Pulse: 89   Weight: 233 lb 8 oz (105.9 kg)   Body mass index is 42.71 kg/m.           Physical Examination:   General appearance: alert, well appearing, and in no distress  Mental status: alert, oriented to person, place, and time  Skin: warm & dry   Extremities: Edema: None    Cardiovascular: normal heart rate noted  Respiratory: normal respiratory effort, no distress  Abdomen: gravid, soft, non-tender  Pelvic: Cervical exam performed         Fetal Status:     Movement: Present    Fetal Surveillance Testing today: FHR 138   Results for orders placed or performed in visit on 06/13/18 (from the past 24 hour(s))  POC Urinalysis Dipstick OB   Collection Time: 06/13/18 12:45 PM  Result Value Ref Range   Color, UA     Clarity, UA     Glucose, UA Negative Negative   Bilirubin, UA     Ketones, UA neg    Spec Grav, UA     Blood, UA neg    pH, UA     POC,PROTEIN,UA Small (1+) Negative, Trace, Small (1+), Moderate (2+), Large (3+), 4+   Urobilinogen, UA     Nitrite, UA positive    Leukocytes, UA Negative Negative   Appearance     Odor      Assessment & Plan:  1) High-risk pregnancy G3P1011 at [redacted]w[redacted]d with an Estimated Date of Delivery: 07/13/18   2) Gestational hypertension, stable, Pr/Cr pending from today, recommend BPP Friday and IOL 37 weeks    Meds: No orders of the defined types were placed in this encounter.   Labs/procedures today:   Treatment Plan:  BPP in 3 days  Reviewed: Preterm labor symptoms and general obstetric precautions including but not limited to vaginal bleeding, contractions, leaking of fluid and fetal movement were reviewed in detail with the patient.  All questions were answered.  Follow-up: Return in about 3 days (around 06/16/2018) for BPP/sono, with Dr Despina Hidden.  Orders Placed This Encounter  Procedures  . Urine Culture  . US Fetal BPP W/O Non Stress  . [place] UA Cord Doppler  . POC Urinalysis Dipstick OB   Lazaro Arms  06/13/2018 12:59 PM

## 2018-06-14 LAB — PROTEIN / CREATININE RATIO, URINE
Creatinine, Urine: 129.4 mg/dL
Protein, Ur: 56.4 mg/dL
Protein/Creat Ratio: 436 mg/g creat — ABNORMAL HIGH (ref 0–200)

## 2018-06-15 ENCOUNTER — Encounter: Payer: Self-pay | Admitting: *Deleted

## 2018-06-15 LAB — URINE CULTURE

## 2018-06-16 ENCOUNTER — Encounter: Payer: Self-pay | Admitting: Obstetrics & Gynecology

## 2018-06-16 ENCOUNTER — Ambulatory Visit (INDEPENDENT_AMBULATORY_CARE_PROVIDER_SITE_OTHER): Payer: Medicaid Other | Admitting: Obstetrics & Gynecology

## 2018-06-16 ENCOUNTER — Ambulatory Visit (INDEPENDENT_AMBULATORY_CARE_PROVIDER_SITE_OTHER): Payer: Medicaid Other

## 2018-06-16 ENCOUNTER — Other Ambulatory Visit: Payer: Self-pay

## 2018-06-16 ENCOUNTER — Other Ambulatory Visit: Payer: Self-pay | Admitting: Obstetrics & Gynecology

## 2018-06-16 VITALS — BP 142/90 | HR 79

## 2018-06-16 DIAGNOSIS — Z331 Pregnant state, incidental: Secondary | ICD-10-CM

## 2018-06-16 DIAGNOSIS — O133 Gestational [pregnancy-induced] hypertension without significant proteinuria, third trimester: Secondary | ICD-10-CM | POA: Diagnosis not present

## 2018-06-16 DIAGNOSIS — Z3403 Encounter for supervision of normal first pregnancy, third trimester: Secondary | ICD-10-CM

## 2018-06-16 DIAGNOSIS — F17200 Nicotine dependence, unspecified, uncomplicated: Secondary | ICD-10-CM

## 2018-06-16 DIAGNOSIS — O1493 Unspecified pre-eclampsia, third trimester: Secondary | ICD-10-CM

## 2018-06-16 DIAGNOSIS — O0993 Supervision of high risk pregnancy, unspecified, third trimester: Secondary | ICD-10-CM

## 2018-06-16 DIAGNOSIS — Z362 Encounter for other antenatal screening follow-up: Secondary | ICD-10-CM | POA: Diagnosis not present

## 2018-06-16 DIAGNOSIS — Z1389 Encounter for screening for other disorder: Secondary | ICD-10-CM

## 2018-06-16 DIAGNOSIS — Z3A36 36 weeks gestation of pregnancy: Secondary | ICD-10-CM

## 2018-06-16 LAB — POCT URINALYSIS DIPSTICK OB
Blood, UA: NEGATIVE
Glucose, UA: NEGATIVE
Ketones, UA: NEGATIVE
Leukocytes, UA: NEGATIVE
Nitrite, UA: POSITIVE
POC,PROTEIN,UA: NEGATIVE

## 2018-06-16 MED ORDER — NITROFURANTOIN MONOHYD MACRO 100 MG PO CAPS: 100.0000 mg | ORAL_CAPSULE | Freq: Two times a day (BID) | ORAL | 0 refills | Status: DC

## 2018-06-16 NOTE — Progress Notes (Addendum)
[place] 36+1 wks,cephalic,anterior placenta gr 2,fhr 137 bpm,BPP 8/8,AFI 8 cm,normal ovaries bilat,elevated UAD w/ end diastolic flow,RI .72,.78,.76,.80=99.3%,EFW 2843 g 50%

## 2018-06-16 NOTE — Treatment Plan (Signed)
   Induction Assessment Scheduling Form: [facility]ax to [facility]:  [phone]  Joanna [name]                                                                                   DOB:  1986-01-25                                                            MRN:  [id number]                                                                     Phone #:   [phone]                         Provider:  [facility]  GP:  [id number]                                                            Estimated Date of Delivery: 07/13/18  Dating Criteria: sonogram + LMP    Medical Indications for induction:  Pre eclampsia Admission Date/Time:  06/22/2018 @ 0800 Gestational age on admission:  [redacted]w[redacted]d   There were no vitals filed for this visit. HIV:  Non Reactive (03/24 3748) [id number]    Cervical exam deferred   Method of induction(proposed):  choice   Scheduling Provider Signature:  Lazaro Arms, MD                                            Today's Date:  06/16/2018

## 2018-06-16 NOTE — Progress Notes (Signed)
Patient ID: [name], female   DOB: Jul 13, 1985, 33 y.o.   MRN: [id number]   HIGH-RISK PREGNANCY VISIT Patient name: Joanna [name] MRN [id number]  Date of birth: Jan 24, 1986 Chief Complaint:   Routine Prenatal Visit (u/s)  History of Present Illness:   Joanna [name] is a 33 y.o. G63P1011 female at [redacted]w[redacted]d with an Estimated Date of Delivery: 07/13/18 being seen today for ongoing management of a high-risk pregnancy complicated by none.  Today she reports no complaints. Contractions: Irregular. Vag. Bleeding: None.  Movement: Present. denies leaking of fluid.  Review of Systems:   Pertinent items are noted in HPI Denies abnormal vaginal discharge w/ itching/odor/irritation, headaches, visual changes, shortness of breath, chest pain, abdominal pain, severe nausea/vomiting, or problems with urination or bowel movements unless otherwise stated above. Pertinent History Reviewed:  Reviewed past medical,surgical, social, obstetrical and family history.  Reviewed problem list, medications and allergies. Physical Assessment:   Vitals:   06/16/18 1024 06/16/18 1026  BP: (!) 148/98 (!) 142/90  Pulse: 79   There is no height or weight on file to calculate BMI.           Physical Examination:   General appearance: alert, well appearing, and in no distress  Mental status: alert, oriented to person, place, and time  Skin: warm & dry   Extremities: Edema: None    Cardiovascular: normal heart rate noted  Respiratory: normal respiratory effort, no distress  Abdomen: gravid, soft, non-tender  Pelvic: Cervical exam deferred         Fetal Status:     Movement: Present    Fetal Surveillance Testing today: BPP   Results for orders placed or performed in visit on 06/16/18 (from the past 24 hour(s))  POC Urinalysis Dipstick OB   Collection Time: 06/16/18 10:35 AM  Result Value Ref Range   Color, UA     Clarity, UA     Glucose, UA Negative Negative   Bilirubin, UA     Ketones, UA neg    Spec  Grav, UA     Blood, UA neg    pH, UA     POC,PROTEIN,UA Negative Negative, Trace, Small (1+), Moderate (2+), Large (3+), 4+   Urobilinogen, UA     Nitrite, UA positive    Leukocytes, UA Negative Negative   Appearance     Odor      Assessment & Plan:  1) High-risk pregnancy G3P1011 at [redacted]w[redacted]d with an Estimated Date of Delivery: 07/13/18   2) Mild pre eclmapsia, stable, with elevated fetal Doppler flow, end diastolic flow is present  3) UTI: macrobid BID x 7 days   Meds:  Meds ordered this encounter  Medications  . nitrofurantoin, macrocrystal-monohydrate, (MACROBID) 100 MG capsule    Sig: Take 1 capsule (100 mg total) by mouth 2 (two) times daily.    Dispense:  14 capsule    Refill:  0    Labs/procedures today: BPP 8/8  Treatment Plan:  IOL 37 weeks for mild pre eclampsia  Reviewed: Term labor symptoms and general obstetric precautions including but not limited to vaginal bleeding, contractions, leaking of fluid and fetal movement were reviewed in detail with the patient.  All questions were answered.  Follow-up: No follow-ups on file.  Orders Placed This Encounter  Procedures  . POC Urinalysis Dipstick OB   Amaryllis Dyke   06/16/2018 11:15 AM

## 2018-06-17 LAB — GC/CHLAMYDIA PROBE AMP
Chlamydia trachomatis, NAA: NEGATIVE
Neisseria Gonorrhoeae by PCR: NEGATIVE

## 2018-06-17 LAB — CULTURE, BETA STREP (GROUP B ONLY): Strep Gp B Culture: POSITIVE — AB

## 2018-06-19 ENCOUNTER — Encounter (HOSPITAL_COMMUNITY): Payer: Self-pay | Admitting: *Deleted

## 2018-06-19 ENCOUNTER — Telehealth (HOSPITAL_COMMUNITY): Payer: Self-pay | Admitting: *Deleted

## 2018-06-19 NOTE — Telephone Encounter (Signed)
Preadmission screen  

## 2018-06-20 ENCOUNTER — Other Ambulatory Visit: Payer: Self-pay | Admitting: Advanced Practice Midwife

## 2018-06-20 ENCOUNTER — Other Ambulatory Visit: Payer: Self-pay

## 2018-06-20 ENCOUNTER — Other Ambulatory Visit (HOSPITAL_COMMUNITY)
Admission: RE | Admit: 2018-06-20 | Discharge: 2018-06-20 | Disposition: A | Discharge status: Home / Self Care | Payer: Medicaid Other | Source: Ambulatory Visit | Attending: Family Medicine | Admitting: Family Medicine

## 2018-06-20 DIAGNOSIS — Z1159 Encounter for screening for other viral diseases: Secondary | ICD-10-CM | POA: Insufficient documentation

## 2018-06-20 NOTE — MAU Note (Signed)
Asymptomatic swab collected. 

## 2018-06-21 ENCOUNTER — Other Ambulatory Visit (HOSPITAL_COMMUNITY): Payer: Self-pay | Admitting: *Deleted

## 2018-06-21 LAB — NOVEL CORONAVIRUS, NAA (HOSP ORDER, SEND-OUT TO REF LAB; TAT 18-24 HRS): SARS-CoV-2, NAA: NOT DETECTED

## 2018-06-22 ENCOUNTER — Inpatient Hospital Stay (HOSPITAL_COMMUNITY): Payer: Medicaid Other

## 2018-06-22 ENCOUNTER — Inpatient Hospital Stay (HOSPITAL_COMMUNITY): Payer: Medicaid Other | Admitting: Anesthesiology

## 2018-06-22 ENCOUNTER — Inpatient Hospital Stay (HOSPITAL_COMMUNITY)
Admission: AD | Admit: 2018-06-22 | Discharge: 2018-06-24 | DRG: 807 | Disposition: A | Discharge status: Home / Self Care | Payer: Medicaid Other | Attending: Obstetrics & Gynecology | Admitting: Obstetrics & Gynecology

## 2018-06-22 ENCOUNTER — Other Ambulatory Visit: Payer: Self-pay

## 2018-06-22 ENCOUNTER — Encounter (HOSPITAL_COMMUNITY): Payer: Self-pay | Admitting: *Deleted

## 2018-06-22 DIAGNOSIS — Z3403 Encounter for supervision of normal first pregnancy, third trimester: Secondary | ICD-10-CM

## 2018-06-22 DIAGNOSIS — Z87891 Personal history of nicotine dependence: Secondary | ICD-10-CM

## 2018-06-22 DIAGNOSIS — Z880 Allergy status to penicillin: Secondary | ICD-10-CM | POA: Diagnosis not present

## 2018-06-22 DIAGNOSIS — F32A Depression, unspecified: Secondary | ICD-10-CM | POA: Diagnosis present

## 2018-06-22 DIAGNOSIS — Z3A37 37 weeks gestation of pregnancy: Secondary | ICD-10-CM | POA: Diagnosis not present

## 2018-06-22 DIAGNOSIS — F17200 Nicotine dependence, unspecified, uncomplicated: Secondary | ICD-10-CM

## 2018-06-22 DIAGNOSIS — O99824 Streptococcus B carrier state complicating childbirth: Secondary | ICD-10-CM | POA: Diagnosis present

## 2018-06-22 DIAGNOSIS — F329 Major depressive disorder, single episode, unspecified: Secondary | ICD-10-CM | POA: Diagnosis present

## 2018-06-22 DIAGNOSIS — O1400 Mild to moderate pre-eclampsia, unspecified trimester: Secondary | ICD-10-CM | POA: Diagnosis present

## 2018-06-22 DIAGNOSIS — O1404 Mild to moderate pre-eclampsia, complicating childbirth: Principal | ICD-10-CM | POA: Diagnosis present

## 2018-06-22 LAB — CBC
HCT: 29.6 % — ABNORMAL LOW (ref 36.0–46.0)
HCT: 31.6 % — ABNORMAL LOW (ref 36.0–46.0)
Hemoglobin: 10 g/dL — ABNORMAL LOW (ref 12.0–15.0)
Hemoglobin: 10.5 g/dL — ABNORMAL LOW (ref 12.0–15.0)
MCH: 28.8 pg (ref 26.0–34.0)
MCH: 29.4 pg (ref 26.0–34.0)
MCHC: 33.2 g/dL (ref 30.0–36.0)
MCHC: 33.8 g/dL (ref 30.0–36.0)
MCV: 85.3 fL (ref 80.0–100.0)
MCV: 88.5 fL (ref 80.0–100.0)
Platelets: 171 10*3/uL (ref 150–400)
Platelets: 189 10*3/uL (ref 150–400)
RBC: 3.47 MIL/uL — ABNORMAL LOW (ref 3.87–5.11)
RBC: 3.57 MIL/uL — ABNORMAL LOW (ref 3.87–5.11)
RDW: 14.1 % (ref 11.5–15.5)
RDW: 14.5 % (ref 11.5–15.5)
WBC: 15 10*3/uL — ABNORMAL HIGH (ref 4.0–10.5)
WBC: 17.8 10*3/uL — ABNORMAL HIGH (ref 4.0–10.5)
nRBC: 0 % (ref 0.0–0.2)
nRBC: 0 % (ref 0.0–0.2)

## 2018-06-22 LAB — COMPREHENSIVE METABOLIC PANEL
ALT: 37 U/L (ref 0–44)
AST: 22 U/L (ref 15–41)
Albumin: 2.6 g/dL — ABNORMAL LOW (ref 3.5–5.0)
Alkaline Phosphatase: 120 U/L (ref 38–126)
Anion gap: 9 (ref 5–15)
BUN: 8 mg/dL (ref 6–20)
CO2: 17 mmol/L — ABNORMAL LOW (ref 22–32)
Calcium: 8.6 mg/dL — ABNORMAL LOW (ref 8.9–10.3)
Chloride: 112 mmol/L — ABNORMAL HIGH (ref 98–111)
Creatinine, Ser: 0.57 mg/dL (ref 0.44–1.00)
GFR calc Af Amer: >60 mL/min (ref >60)
GFR calc non Af Amer: >60 mL/min (ref >60)
Glucose, Bld: 131 mg/dL — ABNORMAL HIGH (ref 70–99)
Potassium: 3.5 mmol/L (ref 3.5–5.1)
Sodium: 138 mmol/L (ref 135–145)
Total Bilirubin: 0.3 mg/dL (ref 0.3–1.2)
Total Protein: 6 g/dL — ABNORMAL LOW (ref 6.5–8.1)

## 2018-06-22 LAB — TYPE AND SCREEN
ABO/RH(D): AB POS
Antibody Screen: NEGATIVE

## 2018-06-22 LAB — RPR: RPR Ser Ql: NONREACTIVE

## 2018-06-22 MED ORDER — OXYTOCIN 40 UNITS IN NORMAL SALINE INFUSION - SIMPLE MED
1.0000 m[IU]/min | INTRAVENOUS | Status: DC
  Administered 2018-06-22: 14:00:00 2 m[IU]/min via INTRAVENOUS

## 2018-06-22 MED ORDER — ACETAMINOPHEN 325 MG PO TABS: 650.0000 mg | ORAL_TABLET | ORAL | Status: DC | PRN

## 2018-06-22 MED ORDER — OXYCODONE-ACETAMINOPHEN 5-325 MG PO TABS: 1.0000 | ORAL_TABLET | ORAL | Status: DC | PRN

## 2018-06-22 MED ORDER — SOD CITRATE-CITRIC ACID 500-334 MG/5ML PO SOLN: 30.0000 mL | ORAL | Status: DC | PRN

## 2018-06-22 MED ORDER — OXYTOCIN BOLUS FROM INFUSION
500.0000 mL | Freq: Once | INTRAVENOUS | Status: AC
  Administered 2018-06-22: 500 mL via INTRAVENOUS

## 2018-06-22 MED ORDER — OXYCODONE-ACETAMINOPHEN 5-325 MG PO TABS: 2.0000 | ORAL_TABLET | ORAL | Status: DC | PRN

## 2018-06-22 MED ORDER — LIDOCAINE HCL (PF) 1 % IJ SOLN
INTRAMUSCULAR | Status: DC | PRN
  Administered 2018-06-22: 5 mL via EPIDURAL

## 2018-06-22 MED ORDER — DIPHENHYDRAMINE HCL 50 MG/ML IJ SOLN: 12.5000 mg | INTRAMUSCULAR | Status: DC | PRN

## 2018-06-22 MED ORDER — LACTATED RINGERS IV SOLN
INTRAVENOUS | Status: DC
  Administered 2018-06-22 (×2): via INTRAVENOUS

## 2018-06-22 MED ORDER — MISOPROSTOL 25 MCG QUARTER TABLET: 25.0000 ug | ORAL_TABLET | ORAL | Status: DC | PRN

## 2018-06-22 MED ORDER — LACTATED RINGERS IV SOLN: 500.0000 mL | INTRAVENOUS | Status: DC | PRN

## 2018-06-22 MED ORDER — CEFAZOLIN SODIUM-DEXTROSE 1-4 GM/50ML-% IV SOLN
1.0000 g | Freq: Three times a day (TID) | INTRAVENOUS | Status: DC
  Administered 2018-06-22: 17:00:00 1 g via INTRAVENOUS
  Filled 2018-06-22 (×2): qty 50

## 2018-06-22 MED ORDER — LIDOCAINE HCL (PF) 1 % IJ SOLN: 30.0000 mL | INTRAMUSCULAR | Status: DC | PRN

## 2018-06-22 MED ORDER — TERBUTALINE SULFATE 1 MG/ML IJ SOLN: 0.2500 mg | Freq: Once | INTRAMUSCULAR | Status: DC | PRN

## 2018-06-22 MED ORDER — EPHEDRINE 5 MG/ML INJ: 10.0000 mg | INTRAVENOUS | Status: DC | PRN

## 2018-06-22 MED ORDER — SODIUM CHLORIDE (PF) 0.9 % IJ SOLN
INTRAMUSCULAR | Status: DC | PRN
  Administered 2018-06-22: 12 mL/h via EPIDURAL

## 2018-06-22 MED ORDER — CEFAZOLIN SODIUM-DEXTROSE 2-4 GM/100ML-% IV SOLN
2.0000 g | Freq: Once | INTRAVENOUS | Status: AC
  Administered 2018-06-22: 09:00:00 2 g via INTRAVENOUS
  Filled 2018-06-22: qty 100

## 2018-06-22 MED ORDER — FENTANYL CITRATE (PF) 100 MCG/2ML IJ SOLN
100.0000 ug | INTRAMUSCULAR | Status: DC | PRN
  Administered 2018-06-22: 11:00:00 100 ug via INTRAVENOUS
  Filled 2018-06-22: qty 2

## 2018-06-22 MED ORDER — FENTANYL-BUPIVACAINE-NACL 0.5-0.125-0.9 MG/250ML-% EP SOLN
12.0000 mL/h | EPIDURAL | Status: DC | PRN
  Filled 2018-06-22: qty 250

## 2018-06-22 MED ORDER — ONDANSETRON HCL 4 MG/2ML IJ SOLN: 4.0000 mg | Freq: Four times a day (QID) | INTRAMUSCULAR | Status: DC | PRN

## 2018-06-22 MED ORDER — LACTATED RINGERS IV SOLN: 500.0000 mL | Freq: Once | INTRAVENOUS | Status: DC

## 2018-06-22 MED ORDER — MISOPROSTOL 50MCG HALF TABLET
50.0000 ug | ORAL_TABLET | ORAL | Status: DC
  Administered 2018-06-22: 50 ug via BUCCAL
  Filled 2018-06-22: qty 1

## 2018-06-22 MED ORDER — OXYTOCIN 40 UNITS IN NORMAL SALINE INFUSION - SIMPLE MED
1.0000 m[IU]/min | INTRAVENOUS | Status: DC
  Filled 2018-06-22: qty 1000

## 2018-06-22 MED ORDER — PHENYLEPHRINE 40 MCG/ML (10ML) SYRINGE FOR IV PUSH (FOR BLOOD PRESSURE SUPPORT)
80.0000 ug | PREFILLED_SYRINGE | INTRAVENOUS | Status: DC | PRN
  Filled 2018-06-22: qty 10

## 2018-06-22 MED ORDER — OXYTOCIN 40 UNITS IN NORMAL SALINE INFUSION - SIMPLE MED: 2.5000 [IU]/h | INTRAVENOUS | Status: DC

## 2018-06-22 NOTE — Anesthesia Preprocedure Evaluation (Signed)
Anesthesia Evaluation  Patient identified by MRN, date of birth, ID band Patient awake    Reviewed: Allergy & Precautions, NPO status , Patient's Chart, lab work & pertinent test results  Airway Mallampati: II  TM Distance: >3 FB Neck ROM: Full    Dental no notable dental hx. (+) Teeth Intact   Pulmonary neg pulmonary ROS, former smoker,    Pulmonary exam normal breath sounds clear to auscultation       Cardiovascular hypertension, Normal cardiovascular exam Rhythm:Regular Rate:Normal  Gest HTN   Neuro/Psych  Headaches,    GI/Hepatic negative GI ROS,   Endo/Other  negative endocrine ROS  Renal/GU negative Renal ROS     Musculoskeletal   Abdominal (+) + obese,   Peds  Hematology Hgb 10.5 Plt 189   Anesthesia Other Findings   Reproductive/Obstetrics (+) Pregnancy                             Anesthesia Physical Anesthesia Plan  ASA: III  Anesthesia Plan: Epidural   Post-op Pain Management:    Induction:   PONV Risk Score and Plan: Treatment may vary due to age or medical condition  Airway Management Planned:   Additional Equipment:   Intra-op Plan:   Post-operative Plan:   Informed Consent: I have reviewed the patients History and Physical, chart, labs and discussed the procedure including the risks, benefits and alternatives for the proposed anesthesia with the patient or authorized representative who has indicated his/her understanding and acceptance.     Dental advisory given  Plan Discussed with: CRNA  Anesthesia Plan Comments: (G3P1 w Gest HTN for LEA Plts 189)        Anesthesia Quick Evaluation

## 2018-06-22 NOTE — Discharge Summary (Signed)
Postpartum Discharge Summary     Patient Name: Joanna [name] DOB: 01/06/1986 MRN: [id number]  Date of admission: 06/22/2018 Delivering Provider: Jacklyn ShellRESENZO-DISHMON, FRANCES   Date of discharge: 06/24/2018  Admitting diagnosis: pregnancy Intrauterine pregnancy: 2531w0d     Secondary diagnosis:  Active Problems:   Depression   Antepartum mild preeclampsia  Additional problems: none     Discharge diagnosis: Term Pregnancy Delivered and Preeclampsia (mild)                                                                                                Post partum procedures:none  Augmentation: AROM, Pitocin, Cytotec and Foley Balloon  Complications: None  Hospital course:  Induction of Labor With Vaginal Delivery   33 y.o. yo G3P1011 at 7331w0d was admitted to the hospital 06/22/2018 for induction of labor.  Indication for induction: Preeclampsia.  Patient had an uncomplicated labor course, delivering the same evening, as follows: Membrane Rupture Time/Date:AROM 6:14 PM ,06/22/2018   Intrapartum Procedures: Episiotomy: None [1]                                         Lacerations:  None [1] none Patient had delivery of a Viable infant.  Information for the patient's newborn:  Andrey CampanileWilson, Girl [place] [[id number]][[id number]]  Delivery Method: Vaginal, Spontaneous(Filed from Delivery Summary)    Details of delivery can be found in separate delivery note.  Patient had a postpartum course remarkable for being started on Maxzide on PPD#2 for borderline BPs and LE edema. Patient is discharged home 06/24/18.  Magnesium Sulfate recieved: No BMZ received: No  Physical exam  Vitals:   06/23/18 2052 06/23/18 2211 06/24/18 0050 06/24/18 0519  BP: (!) 149/86 (!) 148/82 124/81 134/78  Pulse: 72 71 73 72  Resp: 16 18    Temp: 97.8 F (36.6 C)   97.9 F (36.6 C)  TempSrc: Oral   Oral  SpO2: 100%   99%  Weight:      Height:       General: alert and cooperative Lochia: appropriate Uterine Fundus:  firm Incision: N/A DVT Evaluation: No evidence of DVT seen on physical exam; 2+ edema of LEs, L>R Labs: Lab Results  Component Value Date   WBC 17.8 (H) 06/22/2018   HGB 10.0 (L) 06/22/2018   HCT 29.6 (L) 06/22/2018   MCV 85.3 06/22/2018   PLT 171 06/22/2018   CMP Latest Ref Rng & Units 06/22/2018  Glucose 70 - 99 mg/dL 130(Q131(H)  BUN 6 - 20 mg/dL 8  Creatinine 6.570.44 - 8.461.00 mg/dL 9.620.57  Sodium 952135 - 841145 mmol/L 138  Potassium 3.5 - 5.1 mmol/L 3.5  Chloride 98 - 111 mmol/L 112(H)  CO2 22 - 32 mmol/L 17(L)  Calcium 8.9 - 10.3 mg/dL 3.2(G8.6(L)  Total Protein 6.5 - 8.1 g/dL 6.0(L)  Total Bilirubin 0.3 - 1.2 mg/dL 0.3  Alkaline Phos 38 - 126 U/L 120  AST 15 - 41 U/L 22  ALT 0 - 44 U/L 37    Discharge  instruction: per After Visit Summary and "Baby and Me Booklet".  After visit meds:  Allergies as of 06/24/2018      Reactions   Amoxicillin Swelling, Other (See Comments)   Tongue/facial swelling, burning sensation Has patient had a PCN reaction causing immediate rash, facial/tongue/throat swelling, SOB or lightheadedness with hypotension: No Has patient had a PCN reaction causing severe rash involving mucus membranes or skin necrosis: No Has patient had a PCN reaction that required hospitalization: No Has patient had a PCN reaction occurring within the last 10 years: No If all of the above answers are "NO", then may proceed with Cephalosporin use.   Latex Rash   rash      Medication List    STOP taking these medications   nitrofurantoin (macrocrystal-monohydrate) 100 MG capsule Commonly known as:  MACROBID   omeprazole 20 MG capsule Commonly known as:  PRILOSEC   oxyCODONE-acetaminophen 5-325 MG tablet Commonly known as:  PERCOCET/ROXICET     TAKE these medications   aspirin 81 MG chewable tablet Chew 162 mg by mouth daily.   ibuprofen 600 MG tablet Commonly known as:  ADVIL Take 1 tablet (600 mg total) by mouth every 6 (six) hours as needed.   PRENATAL VITAMIN PO Take 1  tablet by mouth daily.   triamterene-hydrochlorothiazide 37.5-25 MG tablet Commonly known as:  MAXZIDE-25 Take 1 tablet by mouth daily.       Diet: routine diet  Activity: Advance as tolerated. Pelvic rest for 6 weeks.   Outpatient follow up:BP check in 1 wk; PP visit in 4 wks Follow up Appt: Future Appointments  Date Time Provider Department Center  06/29/2018  1:45 PM [facility] NURSE [facility]   Follow up Visit:   Please schedule this patient for Postpartum visit in: 4 weeks with the following provider: Any provider For C/S patients schedule nurse incision check in weeks 2 weeks:  High risk pregnancy complicated by: HTN Delivery mode:  SVD Anticipated Birth Control:  IUD PP Procedures needed: BP check 1 week (virtual:  Pt has a  BP cuff) Schedule Integrated BH visit: no   Newborn Data: Live born female Birth Weight:  2810gm (6lb 3.1oz) APGAR: 9, 9  Newborn Delivery   Birth date/time:  06/22/18, 2224 Delivery type:  SVD     Baby Feeding: Bottle and Breast Disposition:home with mother   06/24/2018 Arabella Merles, CNM  8:43 AM

## 2018-06-22 NOTE — Progress Notes (Signed)
Vitals:   06/22/18 1802 06/22/18 1832  BP: 137/74 140/72  Pulse: 81 91  Resp: 18 20  Temp:    SpO2:     Comfortable w/epidural.  Ctx q 2-5 minutes . Pitocin at 16 mu/min. Cx 6/50/-2  AROM w/clear fluid.  FHR Cat 1.  Continue ^ pitocin until better labor pattern.

## 2018-06-22 NOTE — Progress Notes (Signed)
  Patient Vitals for the past 6 hrs:  BP Temp Temp src Pulse Resp Height Weight  06/22/18 1309 - - - - 18 - -  06/22/18 1201 133/73 - - 81 - - -  06/22/18 1158 - 97.7 F (36.5 C) Oral - 20 - -  06/22/18 1120 133/70 - - 82 - - -  06/22/18 1018 137/83 - - 84 - - -  06/22/18 0909 140/72 - - 85 - - -  06/22/18 0844 - - Oral - - 5\' 2"  (1.575 m) 105.9 kg  06/22/18 0814 (!) 141/91 97.7 F (36.5 C) Oral 88 20 - -    Foley out, cx 4.5/50/-2.  Irregular ctx, has taken some IV meds, wants epidural before it gets too bad  FHR Cat 1.  Will start Pitocin

## 2018-06-22 NOTE — Anesthesia Procedure Notes (Signed)
Epidural Patient location during procedure: OB Start time: 06/22/2018 1:37 PM End time: 06/22/2018 1:49 PM  Staffing Anesthesiologist: Trevor Iha, MD Performed: anesthesiologist   Preanesthetic Checklist Completed: patient identified, site marked, surgical consent, pre-op evaluation, timeout performed, IV checked, risks and benefits discussed and monitors and equipment checked  Epidural Patient position: sitting Prep: site prepped and draped and DuraPrep Patient monitoring: continuous pulse ox and blood pressure Approach: midline Location: L3-L4 Injection technique: LOR air  Needle:  Needle type: Tuohy  Needle gauge: 17 G Needle length: 9 cm and 9 Needle insertion depth: 5 cm cm Catheter type: closed end flexible Catheter size: 19 Gauge Catheter at skin depth: 10 cm Test dose: negative  Assessment Events: blood not aspirated, injection not painful, no injection resistance, negative IV test and no paresthesia  Additional Notes Patient identified. Risks/Benefits/Options discussed with patient including but not limited to bleeding, infection, nerve damage, paralysis, failed block, incomplete pain control, headache, blood pressure changes, nausea, vomiting, reactions to medication both or allergic, itching and postpartum back pain. Confirmed with bedside nurse the patient's most recent platelet count. Confirmed with patient that they are not currently taking any anticoagulation, have any bleeding history or any family history of bleeding disorders. Patient expressed understanding and wished to proceed. All questions were answered. Sterile technique was used throughout the entire procedure. Please see nursing notes for vital signs. Test dose was given through epidural needle and negative prior to continuing to dose epidural or start infusion. Warning signs of high block given to the patient including shortness of breath, tingling/numbness in hands, complete motor block, or any  concerning symptoms with instructions to call for help. Patient was given instructions on fall risk and not to get out of bed. All questions and concerns addressed with instructions to call with any issues. 1 Attempt (S) . Patient tolerated procedure well.

## 2018-06-22 NOTE — H&P (Signed)
Joanna [name] is a 33 y.o. female 443P1011 with IUP at 547w0d presenting for IOL for mild preeclampsia. [facility]  Diagnosed w/GHTN 5/3, and PreE on 5/12 after ^ Pr/Cr ratio of 479.  She underwent twice weekly testing since then and presents today for scheduled IOL.    Prenatal History/Complications:  Term SVD 2014 (IOL for GHTN) Early SAB   Past Medical History: Past Medical History:  Diagnosis Date  . Meningitis spinal    [age] years old  . Normal delivery 02/14/2012  . Pericardial cyst   . Pregnancy induced hypertension   . Thoracic cyst     Past Surgical History: Past Surgical History:  Procedure Laterality Date  . TONSILLECTOMY AND ADENOIDECTOMY      Obstetrical History: OB History    Gravida  3   Para  1   Term  1   Preterm  0   AB  1   Living  1     SAB  1   TAB  0   Ectopic  0   Multiple  0   Live Births  1           Social History: Social History   Socioeconomic History  . Marital status: Single    Spouse name: Not on file  . Number of children: 1  . Years of education: 6812  . Highest education level: Some college, no degree  Occupational History  . Not on file  Social Needs  . Financial resource strain: Not very hard  . Food insecurity:    Worry: Never true    Inability: Never true  . Transportation needs:    Medical: No    Non-medical: No  Tobacco Use  . Smoking status: Former Smoker    Packs/day: 0.25    Years: 10.00    Pack years: 2.50    Types: Cigarettes    Last attempt to quit: 02/18/2018    Years since quitting: 0.3  . Smokeless tobacco: Never Used  . Tobacco comment: 2-3 cigarettes daily, pt trying to quit  Substance and Sexual Activity  . Alcohol use: Never    Frequency: Never  . Drug use: No  . Sexual activity: Yes    Birth control/protection: None  Lifestyle  . Physical activity:    Days per week: 3 days    Minutes per session: 30 min  . Stress: To some extent  Relationships  . Social  connections:    Talks on phone: More than three times a week    Gets together: Twice a week    Attends religious service: More than 4 times per year    Active member of club or organization: No    Attends meetings of clubs or organizations: Never    Relationship status: Never married  Other Topics Concern  . Not on file  Social History Narrative  . Not on file    Family History: Family History  Problem Relation Age of Onset  . Hepatitis Mother   . Diabetes Mother        borderline  . Heart disease Mother   . Depression Mother   . Hyperlipidemia Father   . AAA (abdominal aortic aneurysm) Paternal Grandfather   . Graves' disease Maternal Aunt     Allergies: Allergies  Allergen Reactions  . Amoxicillin Swelling and Other (See Comments)    Tongue/facial swelling, burning sensation Has patient had a PCN reaction causing immediate rash, facial/tongue/throat swelling, SOB or lightheadedness with hypotension:  No Has patient had a PCN reaction causing severe rash involving mucus membranes or skin necrosis: No Has patient had a PCN reaction that required hospitalization: No Has patient had a PCN reaction occurring within the last 10 years: No If all of the above answers are "NO", then may proceed with Cephalosporin use.   . Latex Rash    rash    Medications Prior to Admission  Medication Sig Dispense Refill Last Dose  . aspirin 81 MG chewable tablet Chew 162 mg by mouth daily.   Taking  . nitrofurantoin, macrocrystal-monohydrate, (MACROBID) 100 MG capsule Take 1 capsule (100 mg total) by mouth 2 (two) times daily. 14 capsule 0   . omeprazole (PRILOSEC) 20 MG capsule Take 1 capsule (20 mg total) by mouth daily. 1 tablet a day 30 capsule 6 Taking  . oxyCODONE-acetaminophen (PERCOCET/ROXICET) 5-325 MG tablet Take 1-2 tablets by mouth every 6 (six) hours as needed for severe pain. (Patient not taking: Reported on 06/13/2018) 15 tablet 0 Not Taking  . Prenatal Vit-Fe Fumarate-FA  (PRENATAL VITAMIN PO) Take by mouth.   Taking  . witch hazel-glycerin (TUCKS) pad Apply 1 application topically as needed for itching.   Not Taking        Review of Systems   Constitutional: Negative for fever and chills Eyes: Negative for visual disturbances Respiratory: Negative for shortness of breath, dyspnea Cardiovascular: Negative for chest pain or palpitations  Gastrointestinal: Negative for abdominal pain, vomiting, diarrhea and constipation.   Genitourinary: Negative for dysuria and urgency Musculoskeletal: Negative for back pain, joint pain, myalgias  Neurological: Negative for dizziness and headaches      Blood pressure 140/72, pulse 85, temperature 97.7 F (36.5 C), temperature source Oral, resp. rate 20, height 5\' 2"  (1.575 m), weight 105.9 kg. General appearance: alert, cooperative, appears stated age and no distress Lungs: clear to auscultation bilaterally Heart: regular rate and rhythm Abdomen: soft, non-tender; bowel sounds normal Extremities: Homans sign is negative, no sign of DVT DTR's 2+ Presentation: cephalic Fetal monitoring  Baseline: 145 bpm, Variability: Good {> 6 bpm), Accelerations: Reactive and Decelerations: Absent Uterine activity  None  Dilation: 1.5 Exam by:: Drenda Freeze Cres-Dishmon CNM   Prenatal labs: ABO, Rh: AB/Positive/-- (11/26 1201) Antibody: Negative (03/24 0851) Rubella: immune RPR: Non Reactive (03/24 0851)  HBsAg: Negative (11/26 1201)  HIV: Non Reactive (03/24 0851)  GBS: Positive (05/12 0000)   Prenatal Transfer Tool  Maternal Diabetes: No Genetic Screening: Normal Maternal Ultrasounds/Referrals: Normal Fetal Ultrasounds or other Referrals:  None Maternal Substance Abuse:  No Significant Maternal Medications:  None Significant Maternal Lab Results: Lab values include: Group B Strep positive    Results for orders placed or performed during the hospital encounter of 06/22/18 (from the past 24 hour(s))  CBC   Collection  Time: 06/22/18  8:27 AM  Result Value Ref Range   WBC 15.0 (H) 4.0 - 10.5 K/uL   RBC 3.57 (L) 3.87 - 5.11 MIL/uL   Hemoglobin 10.5 (L) 12.0 - 15.0 g/dL   HCT 78.2 (L) 95.6 - 21.3 %   MCV 88.5 80.0 - 100.0 fL   MCH 29.4 26.0 - 34.0 pg   MCHC 33.2 30.0 - 36.0 g/dL   RDW 08.6 57.8 - 46.9 %   Platelets 189 150 - 400 K/uL   nRBC 0.0 0.0 - 0.2 %  Comprehensive metabolic panel   Collection Time: 06/22/18  8:27 AM  Result Value Ref Range   Sodium 138 135 - 145 mmol/L   Potassium 3.5  3.5 - 5.1 mmol/L   Chloride 112 (H) 98 - 111 mmol/L   CO2 17 (L) 22 - 32 mmol/L   Glucose, Bld 131 (H) 70 - 99 mg/dL   BUN 8 6 - 20 mg/dL   Creatinine, Ser 1.88 0.44 - 1.00 mg/dL   Calcium 8.6 (L) 8.9 - 10.3 mg/dL   Total Protein 6.0 (L) 6.5 - 8.1 g/dL   Albumin 2.6 (L) 3.5 - 5.0 g/dL   AST 22 15 - 41 U/L   ALT 37 0 - 44 U/L   Alkaline Phosphatase 120 38 - 126 U/L   Total Bilirubin 0.3 0.3 - 1.2 mg/dL   GFR calc non Af Amer >60 >60 mL/min   GFR calc Af Amer >60 >60 mL/min   Anion gap 9 5 - 15      [facility]  LAB RESULTS  Language English Pap 12/27/17: neg w/ -HRHPV  Initiated care at 11wk GC/CT Initial: -/-           36wks:-/-  Dating by 6wk U/S     Support person Did not disclose Genetics NT/IT: neg          [place]/HgbE   Flu vaccine 11/01/17 UNCR CF neg  TDaP vaccine 04/25/18 SMA   Rhogam       Blood Type AB/Positive/-- (11/26 1201)  Anatomy US Female, 'Raine' Antibody Negative (11/26 1201)  Feeding Plan undecided HBsAg Negative (11/26 1201)  Contraception Possibly IUD RPR Non Reactive (11/26 1201)  Circumcision n/a Rubella  15.10 (11/26 1201)  Pediatrician [facility] HIV Non Reactive (11/26 1201)  Prenatal Classes declined      GTT/A1C Early:      26-28wks: 87/145/114  BTL Consent  GBS    +    Arly.Keller ] PCN allergy (sensitivities not done)  VBAC Consent n/a    Waterbirth Class Consent CNM visit PP Needs       Assessment: Joanna [name] is a 33 y.o. G3P1011 with an IUP at [redacted]w[redacted]d  presenting for IOL for mild preeclampsia.  Plan: #Labor: Oral Cytotec->Foley placed in cx and inflated w/60cc H20; plan pitocin when foley falls out #Pain:  Per request #FWB Cat 1 #ID: GBS: Ancef (low risk PCN allergy)    Jacklyn Shell 06/22/2018, 9:24 AM

## 2018-06-23 ENCOUNTER — Encounter (HOSPITAL_COMMUNITY): Payer: Self-pay

## 2018-06-23 LAB — ABO/RH: ABO/RH(D): AB POS

## 2018-06-23 MED ORDER — ACETAMINOPHEN 325 MG PO TABS: 650.0000 mg | ORAL_TABLET | Freq: Four times a day (QID) | ORAL | Status: DC | PRN

## 2018-06-23 MED ORDER — PRENATAL MULTIVITAMIN CH
1.0000 | ORAL_TABLET | Freq: Every day | ORAL | Status: DC
  Administered 2018-06-23 – 2018-06-24 (×2): 1 via ORAL
  Filled 2018-06-23 (×2): qty 1

## 2018-06-23 MED ORDER — DIPHENHYDRAMINE HCL 25 MG PO CAPS: 25.0000 mg | ORAL_CAPSULE | Freq: Four times a day (QID) | ORAL | Status: DC | PRN

## 2018-06-23 MED ORDER — DOCUSATE SODIUM 100 MG PO CAPS
100.0000 mg | ORAL_CAPSULE | Freq: Two times a day (BID) | ORAL | Status: DC
  Administered 2018-06-24: 09:00:00 100 mg via ORAL
  Filled 2018-06-23 (×2): qty 1

## 2018-06-23 MED ORDER — IBUPROFEN 600 MG PO TABS
600.0000 mg | ORAL_TABLET | Freq: Four times a day (QID) | ORAL | Status: DC
  Administered 2018-06-23 – 2018-06-24 (×6): 600 mg via ORAL
  Filled 2018-06-23 (×6): qty 1

## 2018-06-23 MED ORDER — IBUPROFEN 600 MG PO TABS
600.0000 mg | ORAL_TABLET | Freq: Four times a day (QID) | ORAL | Status: DC
  Administered 2018-06-23: 03:00:00 600 mg via ORAL
  Filled 2018-06-23: qty 1

## 2018-06-23 MED ORDER — MEASLES, MUMPS & RUBELLA VAC IJ SOLR: 0.5000 mL | Freq: Once | INTRAMUSCULAR | Status: DC

## 2018-06-23 MED ORDER — METHYLERGONOVINE MALEATE 0.2 MG/ML IJ SOLN: 0.2000 mg | INTRAMUSCULAR | Status: DC | PRN

## 2018-06-23 MED ORDER — ONDANSETRON HCL 4 MG/2ML IJ SOLN: 4.0000 mg | INTRAMUSCULAR | Status: DC | PRN

## 2018-06-23 MED ORDER — DIBUCAINE (PERIANAL) 1 % EX OINT: 1.0000 "application " | TOPICAL_OINTMENT | CUTANEOUS | Status: DC | PRN

## 2018-06-23 MED ORDER — ENALAPRIL MALEATE 5 MG PO TABS: 5.0000 mg | ORAL_TABLET | Freq: Every day | ORAL | Status: DC

## 2018-06-23 MED ORDER — WITCH HAZEL-GLYCERIN EX PADS: 1.0000 "application " | MEDICATED_PAD | CUTANEOUS | Status: DC | PRN

## 2018-06-23 MED ORDER — SIMETHICONE 80 MG PO CHEW
80.0000 mg | CHEWABLE_TABLET | ORAL | Status: DC | PRN
  Administered 2018-06-23: 18:00:00 80 mg via ORAL
  Filled 2018-06-23: qty 1

## 2018-06-23 MED ORDER — TETANUS-DIPHTH-ACELL PERTUSSIS 5-2.5-18.5 LF-MCG/0.5 IM SUSP: 0.5000 mL | Freq: Once | INTRAMUSCULAR | Status: DC

## 2018-06-23 MED ORDER — PANTOPRAZOLE SODIUM 40 MG PO TBEC
40.0000 mg | DELAYED_RELEASE_TABLET | Freq: Every day | ORAL | Status: DC
  Administered 2018-06-23 – 2018-06-24 (×2): 40 mg via ORAL
  Filled 2018-06-23 (×2): qty 1

## 2018-06-23 MED ORDER — COCONUT OIL OIL
1.0000 "application " | TOPICAL_OIL | Status: DC | PRN
  Administered 2018-06-23: 1 via TOPICAL

## 2018-06-23 MED ORDER — BISACODYL 10 MG RE SUPP: 10.0000 mg | Freq: Every day | RECTAL | Status: DC | PRN

## 2018-06-23 MED ORDER — BENZOCAINE-MENTHOL 20-0.5 % EX AERO: 1.0000 "application " | INHALATION_SPRAY | CUTANEOUS | Status: DC | PRN

## 2018-06-23 MED ORDER — ACETAMINOPHEN 325 MG PO TABS
650.0000 mg | ORAL_TABLET | ORAL | Status: DC | PRN
  Administered 2018-06-23 – 2018-06-24 (×5): 650 mg via ORAL
  Filled 2018-06-23 (×5): qty 2

## 2018-06-23 MED ORDER — FLEET ENEMA 7-19 GM/118ML RE ENEM: 1.0000 | ENEMA | Freq: Every day | RECTAL | Status: DC | PRN

## 2018-06-23 MED ORDER — ENALAPRIL MALEATE 5 MG PO TABS
5.0000 mg | ORAL_TABLET | Freq: Every day | ORAL | Status: DC
  Filled 2018-06-23: qty 1

## 2018-06-23 MED ORDER — ONDANSETRON HCL 4 MG PO TABS: 4.0000 mg | ORAL_TABLET | ORAL | Status: DC | PRN

## 2018-06-23 MED ORDER — METHYLERGONOVINE MALEATE 0.2 MG PO TABS: 0.2000 mg | ORAL_TABLET | ORAL | Status: DC | PRN

## 2018-06-23 MED ORDER — ZOLPIDEM TARTRATE 5 MG PO TABS: 5.0000 mg | ORAL_TABLET | Freq: Every evening | ORAL | Status: DC | PRN

## 2018-06-23 MED ORDER — FERROUS SULFATE 325 (65 FE) MG PO TABS
325.0000 mg | ORAL_TABLET | Freq: Two times a day (BID) | ORAL | Status: DC
  Administered 2018-06-23 – 2018-06-24 (×3): 325 mg via ORAL
  Filled 2018-06-23 (×3): qty 1

## 2018-06-23 NOTE — Anesthesia Postprocedure Evaluation (Signed)
Anesthesia Post Note  Patient: Joanna [name]  Procedure(s) Performed: AN AD HOC LABOR EPIDURAL     Patient location during evaluation: Mother Baby Anesthesia Type: Epidural Level of consciousness: awake, awake and alert, oriented and patient cooperative Pain management: pain level controlled Vital Signs Assessment: post-procedure vital signs reviewed and stable Respiratory status: spontaneous breathing, nonlabored ventilation and respiratory function stable Cardiovascular status: stable Postop Assessment: no headache, no backache, patient able to bend at knees and no apparent nausea or vomiting Anesthetic complications: no Comments: Phone interview with patient.    Last Vitals:  Vitals:   06/23/18 0315 06/23/18 0445  BP:  124/72  Pulse:  84  Resp:  18  Temp: 36.8 C 36.9 C  SpO2:  99%    Last Pain:  Vitals:   06/23/18 0500  TempSrc:   PainSc: 3    Pain Goal:                   Eliabeth Shoff L

## 2018-06-23 NOTE — Progress Notes (Signed)
Vitals:   06/23/18 0049 06/23/18 0146 06/23/18 0315 06/23/18 0445  BP: 129/68 121/64  124/72  Pulse: 76 75  84  Resp: 16 16  18   Temp: 98.1 F (36.7 C) 99.4 F (37.4 C) 98.2 F (36.8 C) 98.4 F (36.9 C)  TempSrc: Oral Oral Oral Oral  SpO2: 100% 99%  99%  Weight:      Height:       Post Partum Day 1 Subjective: no complaints, up ad lib, voiding and tolerating PO, small lochia, unsure about feeding plan, difficulty w/BF and also bottlefeeding. , IUD  Objective: Blood pressure 124/72, pulse 84, temperature 98.4 F (36.9 C), temperature source Oral, resp. rate 18, height 5\' 2"  (1.575 m), weight 105.9 kg, SpO2 99 %.  Physical Exam:  General: alert, cooperative and no distress Lochia:normal flow Chest: CTAB Heart: RRR no m/r/g Abdomen: +BS, soft, nontender,  Uterine Fundus: firm DVT Evaluation: No evidence of DVT seen on physical exam. Extremities: trace edema  Recent Labs    06/22/18 0827 06/22/18 2335  HGB 10.5* 10.0*  HCT 31.6* 29.6*    Assessment/Plan: Lactation consult   LOS: 1 day   Jacklyn Shell 06/23/2018, 8:07 AM

## 2018-06-23 NOTE — Lactation Note (Signed)
This note was copied from a baby's chart. Lactation Consultation Note  Patient Name: Joanna [name] Today's Date: 06/23/2018 Reason for consult: Initial assessment;Early term 37-38.6wks P1, 7 hour female infant. Mom feeding choice at admission is breast and formula feeding.  Per Nurse, mom is very sleepy and prefers to have LC services later in the day.   Maternal Data Formula Feeding for Exclusion: Yes Reason for exclusion: Mother's choice to formula and breast feed on admission  Feeding    LATCH Score                   Interventions    Lactation Tools Discussed/Used     Consult Status      Joanna [name] 06/23/2018, 6:00 AM

## 2018-06-23 NOTE — Lactation Note (Signed)
This note was copied from a baby's chart. Lactation Consultation Note  Patient Name: Girl Imana Minarik [id number] Date: 06/23/2018 Reason for consult: Follow-up assessment;1st time breastfeeding;Primapara;Early term 5-38.6wks  Visited with P1 Mom on early term baby at 30 hrs old.  Baby is at 3.4% weight loss.  Mom is breast and formula feeding.  Pediatrician ordered formula supplementation due to weight loss and being <6 lbs and 37 weeks.    Mom has a pore on left nipple that is raised and she is able to squeeze small amount of white material from pore.  This is not colostrum looking.  Nipple is not inflamed, and is not painful.  This occurred prior to pregnancy.  Mom denies any nipple piercing.  Mom about to take a warm shower.  Encouraged massage and warm compresses on nipple.  RN to notify OB about possible culture.  RN set up DEBP at bedside, and will assist Mom with pumping when baby receives formula.  Baby has already been formula fed by bottle.   Lactation brochure left in room.   Will follow-up with Mom later.  Interventions Interventions: Breast feeding basics reviewed;Skin to skin;Breast massage;Hand express;DEBP  Lactation Tools Discussed/Used Tools: Pump Breast pump type: Double-Electric Breast Pump Pump Review: Setup, frequency, and cleaning;Milk Storage Initiated by:: Devin RN Date initiated:: 06/23/18   Consult Status Consult Status: Follow-up Date: 06/24/18 Follow-up type: In-patient    Judee Clara 06/23/2018, 12:56 PM

## 2018-06-23 NOTE — Progress Notes (Signed)
CSW received consult due to score 12 on Edinburgh Depression Screen.  CSW met with MOB at bedside to offer support and complete assessment.  MOB sitting on the couch, breastfeeding infant, when CSW entered the room. FOB also present and resting in bed. CSW introduced self and received verbal permission to complete assessment with FOB present. FOB offered to leave but MOB assured him that it was ok that he stay. CSW explained reason for consult and MOB expressed understanding. CSW inquired about MOB's mental health history and MOB appeared confused. MOB denied being diagnosed with depression and does not believe she has ever been clinically depressed. MOB did acknowledge having sad days but stated they were manageable. MOB also acknowledged experiencing anxiety before and during her pregnancy due to things changing but stated that was also manageable. MOB stated when symptoms come up she likes to be alone and uses "mind over matter". MOB denied experiencing any PPD with her first born but was receptive to education. CSW provided education regarding Baby Blues vs PMADs and provided MOB with resources for mental health follow up.  CSW encouraged MOB to evaluate her mental health throughout the postpartum period with the use of the New Mom Checklist developed by Postpartum Progress as well as the [place] Postnatal Depression Scale and notify a medical professional if symptoms arise. MOB reported she currently feels good and denied any current mental health concerns. MOB stated she feels well supported by FOB, her brother, her mother and her grandfather.    MOB confirmed having all essential items for infant once discharged. MOB stated infant would be sleeping in a basinet once home. CSW provided review of Sudden Infant Death Syndrome (SIDS) precautions and safe sleeping habits.    MOB denied having any further questions, concerns or need for resources from [place] identifies no further need for intervention and  no barriers to discharge at this time.  Ollen Barges, [place]  [facility] [employer] [phone]

## 2018-06-24 MED ORDER — TRIAMTERENE-HCTZ 37.5-25 MG PO TABS: 1.0000 | ORAL_TABLET | Freq: Every day | ORAL | 2 refills | Status: DC

## 2018-06-24 MED ORDER — TRIAMTERENE-HCTZ 37.5-25 MG PO TABS
1.0000 | ORAL_TABLET | Freq: Every day | ORAL | Status: DC
  Administered 2018-06-24: 09:00:00 1 via ORAL
  Filled 2018-06-24: qty 1

## 2018-06-24 MED ORDER — IBUPROFEN 600 MG PO TABS: 600.0000 mg | ORAL_TABLET | Freq: Four times a day (QID) | ORAL | 0 refills | Status: DC | PRN

## 2018-06-24 NOTE — Lactation Note (Signed)
This note was copied from a baby's chart. Lactation Consultation Note  Patient Name: Joanna [name] Today's Date: 06/24/2018   P2, Baby 34 hours.  Mother states she plans to not breastfeed for long due to work. Discussed pumping options.  Mother states bf is hurting and she doesn't think she will continue for much longer.  R nipple has slight abrasion.  Mother has coconut oil to use for soreness. Suggest prepumping w/ manual pump to elongate nipple for increased depth w/ latching. Reviewed engorgement care, cabbage leaves and monitoring voids/stools. Provided mother w/ manual pump.         Maternal Data    Feeding    LATCH Score                   Interventions    Lactation Tools Discussed/Used     Consult Status      Dahlia Byes [facility] 06/24/2018, 9:14 AM

## 2018-06-24 NOTE — Progress Notes (Signed)
Pt very reluctant about starting BP meds.  States she has never taken any in the past and would like to speak to MD/CNM before deciding to take med.  Enalapril educational handout given to pt. and informed her that she was due for 5mg  qDay.  Initially (at  2052) BP=149/86 and recheck (at 2211) BP=148/82.  Repeated a BP again at 0050 and BP=124/81 and this was immediately after pt had been OOB and "washing/changing pads in bathroom".  Pt asymptomatic.  Preeclampsia handout given to pt and she agreed to call RN if she begins to experience any symptoms.  FOB at bedside.

## 2018-06-24 NOTE — Lactation Note (Signed)
This note was copied from a baby's chart. Lactation Consultation Note Baby 56 hrs old.  Mom is Breast/formula feeding baby. Mom has been mainly formula feeding baby. Discussed milk supply, supply and demand, cluster feeding, milk coming in, engorgement, milk storage, breast massage, hand expression, breast care, baby satisfaction, support and safety while feeding. LC isn't sure how committed mom is to BF.  Encouraged to call for assistance or questions. Has Lactation brochure and resource information.  Patient Name: Joanna [name] [id number] Date: 06/24/2018 Reason for consult: Follow-up assessment;Nipple pain/trauma   Maternal Data    Feeding Feeding Type: Breast Fed Nipple Type: Slow - flow  LATCH Score Latch: Grasps breast easily, tongue down, lips flanged, rhythmical sucking.  Audible Swallowing: A few with stimulation  Type of Nipple: Everted at rest and after stimulation  Comfort (Breast/Nipple): Filling, red/small blisters or bruises, mild/mod discomfort  Hold (Positioning): Assistance needed to correctly position infant at breast and maintain latch.  LATCH Score: 7  Interventions Interventions: Support pillows;Position options;Breast massage;Breast compression  Lactation Tools Discussed/Used     Consult Status Consult Status: Complete Date: 06/24/18    Charyl Dancer 06/24/2018, 1:24 AM

## 2018-06-24 NOTE — Discharge Instructions (Signed)
Postpartum Hypertension °Postpartum hypertension is high blood pressure that remains higher than normal after childbirth. You may not realize that you have postpartum hypertension if your blood pressure is not being checked regularly. In most cases, postpartum hypertension will go away on its own, usually within a week of delivery. However, for some women, medical treatment is required to prevent serious complications, such as seizures or stroke. °What are the causes? °This condition may be caused by one or more of the following: °· Hypertension that existed before pregnancy (chronic hypertension). °· Hypertension that comes on as a result of pregnancy (gestational hypertension). °· Hypertensive disorders during pregnancy (preeclampsia) or seizures in women who have high blood pressure during pregnancy (eclampsia). °· A condition in which the liver, platelets, and red blood cells are damaged during pregnancy (HELLP syndrome). °· A condition in which the thyroid produces too much hormones (hyperthyroidism). °· Other rare problems of the nerves (neurological disorders) or blood disorders. °In some cases, the cause may not be known. °What increases the risk? °The following factors may make you more likely to develop this condition: °· Chronic hypertension. In some cases, this may not have been diagnosed before pregnancy. °· Obesity. °· Type 2 diabetes. °· Kidney disease. °· History of preeclampsia or eclampsia. °· Other medical conditions that change the level of hormones in the body (hormonal imbalance). °What are the signs or symptoms? °As with all types of hypertension, postpartum hypertension may not have any symptoms. Depending on how high your blood pressure is, you may experience: °· Headaches. These may be mild, moderate, or severe. They may also be steady, constant, or sudden in onset (thunderclap headache). °· Changes in your ability to see (visual changes). °· Dizziness. °· Shortness of breath. °· Swelling  of your hands, feet, lower legs, or face. In some cases, you may have swelling in more than one of these locations. °· Heart palpitations or a racing heartbeat. °· Difficulty breathing while lying down. °· Decrease in the amount of urine that you pass. °Other rare signs and symptoms may include: °· Sweating more than usual. This lasts longer than a few days after delivery. °· Chest pain. °· Sudden dizziness when you get up from sitting or lying down. °· Seizures. °· Nausea or vomiting. °· Abdominal pain. °How is this diagnosed? °This condition may be diagnosed based on the results of a physical exam, blood pressure measurements, and blood and urine tests. °You may also have other tests, such as a CT scan or an MRI, to check for other problems of postpartum hypertension. °How is this treated? °If blood pressure is high enough to require treatment, your options may include: °· Medicines to reduce blood pressure (antihypertensives). Tell your health care provider if you are breastfeeding or if you plan to breastfeed. There are many antihypertensive medicines that are safe to take while breastfeeding. °· Stopping medicines that may be causing hypertension. °· Treating medical conditions that are causing hypertension. °· Treating the complications of hypertension, such as seizures, stroke, or kidney problems. °Your health care provider will also continue to monitor your blood pressure closely until it is within a safe range for you. °Follow these instructions at home: °· Take over-the-counter and prescription medicines only as told by your health care provider. °· Return to your normal activities as told by your health care provider. Ask your health care provider what activities are safe for you. °· Do not use any products that contain nicotine or tobacco, such as cigarettes and e-cigarettes. If   you need help quitting, ask your health care provider. °· Keep all follow-up visits as told by your health care provider. This  is important. °Contact a health care provider if: °· Your symptoms get worse. °· You have new symptoms, such as: °? A headache that does not get better. °? Dizziness. °? Visual changes. °Get help right away if: °· You suddenly develop swelling in your hands, ankles, or face. °· You have sudden, rapid weight gain. °· You develop difficulty breathing, chest pain, racing heartbeat, or heart palpitations. °· You develop severe pain in your abdomen. °· You have any symptoms of a stroke. "BE FAST" is an easy way to remember the main warning signs of a stroke: °? B - Balance. Signs are dizziness, sudden trouble walking, or loss of balance. °? E - Eyes. Signs are trouble seeing or a sudden change in vision. °? F - Face. Signs are sudden weakness or numbness of the face, or the face or eyelid drooping on one side. °? A - Arms. Signs are weakness or numbness in an arm. This happens suddenly and usually on one side of the body. °? S - Speech. Signs are sudden trouble speaking, slurred speech, or trouble understanding what people say. °? T - Time. Time to call emergency services. Write down what time symptoms started. °· You have other signs of a stroke, such as: °? A sudden, severe headache with no known cause. °? Nausea or vomiting. °? Seizure. °These symptoms may represent a serious problem that is an emergency. Do not wait to see if the symptoms will go away. Get medical help right away. Call your local emergency services (911 in the [place] Do not drive yourself to the hospital. °Summary °· Postpartum hypertension is high blood pressure that remains higher than normal after childbirth. °· In most cases, postpartum hypertension will go away on its own, usually within a week of delivery. °· For some women, medical treatment is required to prevent serious complications, such as seizures or stroke. °This information is not intended to replace advice given to you by your health care provider. Make sure you discuss any questions  you have with your health care provider. °Document Released: 09/21/2013 Document Revised: 11/08/2016 Document Reviewed: 11/08/2016 °Elsevier Interactive Patient Education © 2019 [employer] °Vaginal Delivery, Care After °Refer to this sheet in the next few weeks. These instructions provide you with information about caring for yourself after vaginal delivery. Your health care provider may also give you more specific instructions. Your treatment has been planned according to current medical practices, but problems sometimes occur. Call your health care provider if you have any problems or questions. °What can I expect after the procedure? °After vaginal delivery, it is common to have: °· Some bleeding from your vagina. °· Soreness in your abdomen, your vagina, and the area of skin between your vaginal opening and your anus (perineum). °· Pelvic cramps. °· Fatigue. °Follow these instructions at home: °Medicines °· Take over-the-counter and prescription medicines only as told by your health care provider. °· If you were prescribed an antibiotic medicine, take it as told by your health care provider. Do not stop taking the antibiotic until it is finished. °Driving ° °· Do not drive or operate heavy machinery while taking prescription pain medicine. °· Do not drive for 24 hours if you received a sedative. °Lifestyle °· Do not drink alcohol. This is especially important if you are breastfeeding or taking medicine to relieve pain. °· Do not use tobacco   products, including cigarettes, chewing tobacco, or e-cigarettes. If you need help quitting, ask your health care provider. °Eating and drinking °· Drink at least 8 eight-ounce glasses of water every day unless you are told not to by your health care provider. If you choose to breastfeed your baby, you may need to drink more water than this. °· Eat high-fiber foods every day. These foods may help prevent or relieve constipation. High-fiber foods include: °? Whole grain  cereals and breads. °? Brown rice. °? Beans. °? Fresh fruits and vegetables. °Activity °· Return to your normal activities as told by your health care provider. Ask your health care provider what activities are safe for you. °· Rest as much as possible. Try to rest or take a nap when your baby is sleeping. °· Do not lift anything that is heavier than your baby or 10 lb (4.5 kg) until your health care provider says that it is safe. °· Talk with your health care provider about when you can engage in sexual activity. This may depend on your: °? Risk of infection. °? Rate of healing. °? Comfort and desire to engage in sexual activity. °Vaginal Care °· If you have an episiotomy or a vaginal tear, check the area every day for signs of infection. Check for: °? More redness, swelling, or pain. °? More fluid or blood. °? Warmth. °? Pus or a bad smell. °· Do not use tampons or douches until your health care provider says this is safe. °· Watch for any blood clots that may pass from your vagina. These may look like clumps of dark red, brown, or black discharge. °General instructions °· Keep your perineum clean and dry as told by your health care provider. °· Wear loose, comfortable clothing. °· Wipe from front to back when you use the toilet. °· Ask your health care provider if you can shower or take a bath. If you had an episiotomy or a perineal tear during labor and delivery, your health care provider may tell you not to take baths for a certain length of time. °· Wear a bra that supports your breasts and fits you well. °· If possible, have someone help you with household activities and help care for your baby for at least a few days after you leave the hospital. °· Keep all follow-up visits for you and your baby as told by your health care provider. This is important. °Contact a health care provider if: °· You have: °? Vaginal discharge that has a bad smell. °? Difficulty urinating. °? Pain when urinating. °? A sudden  increase or decrease in the frequency of your bowel movements. °? More redness, swelling, or pain around your episiotomy or vaginal tear. °? More fluid or blood coming from your episiotomy or vaginal tear. °? Pus or a bad smell coming from your episiotomy or vaginal tear. °? A fever. °? A rash. °? Little or no interest in activities you used to enjoy. °? Questions about caring for yourself or your baby. °· Your episiotomy or vaginal tear feels warm to the touch. °· Your episiotomy or vaginal tear is separating or does not appear to be healing. °· Your breasts are painful, hard, or turn red. °· You feel unusually sad or worried. °· You feel nauseous or you vomit. °· You pass large blood clots from your vagina. If you pass a blood clot from your vagina, save it to show to your health care provider. Do not flush blood clots down the toilet   without having your health care provider look at them. °· You urinate more than usual. °· You are dizzy or light-headed. °· You have not breastfed at all and you have not had a menstrual period for 12 weeks after delivery. °· You have stopped breastfeeding and you have not had a menstrual period for 12 weeks after you stopped breastfeeding. °Get help right away if: °· You have: °? Pain that does not go away or does not get better with medicine. °? Chest pain. °? Difficulty breathing. °? Blurred vision or spots in your vision. °? Thoughts about hurting yourself or your baby. °· You develop pain in your abdomen or in one of your legs. °· You develop a severe headache. °· You faint. °· You bleed from your vagina so much that you fill two sanitary pads in one hour. °This information is not intended to replace advice given to you by your health care provider. Make sure you discuss any questions you have with your health care provider. °Document Released: 01/16/2000 Document Revised: 07/02/2015 Document Reviewed: 02/02/2015 °Elsevier Interactive Patient Education © 2019 [employer] Inc. ° °

## 2018-06-29 ENCOUNTER — Other Ambulatory Visit: Payer: Self-pay

## 2018-06-29 ENCOUNTER — Ambulatory Visit (INDEPENDENT_AMBULATORY_CARE_PROVIDER_SITE_OTHER): Payer: Medicaid Other | Admitting: *Deleted

## 2018-06-29 VITALS — BP 134/88 | HR 88 | Wt 214.0 lb

## 2018-06-29 DIAGNOSIS — Z0130 Encounter for examination of blood pressure without abnormal findings: Secondary | ICD-10-CM

## 2018-06-29 NOTE — Progress Notes (Signed)
BP reviewed with Dr Despina Hidden.  Patient to return for pp visit.

## 2018-07-10 ENCOUNTER — Telehealth: Payer: Self-pay | Admitting: Obstetrics & Gynecology

## 2018-07-10 NOTE — Telephone Encounter (Signed)
Pt aware of Dr. Brynda Greathouse recommendations and will plan on coming by tomorrow am to leave a urine sample. [employer]

## 2018-07-10 NOTE — Telephone Encounter (Signed)
Pt is wanting to see if an antibiotic can be sent in for her UTI. Also she states she had her baby 06/22/2018 and still bleeding. The bleeding is light and she is wanting to know if this is normal. She states she will not be able to come in due to having both of her kids with her.

## 2018-07-10 NOTE — Telephone Encounter (Signed)
Spoke with pt. Pt has a foul smell to urine and urine is cloudy. Pt delivered 18 days ago. Concerned that she is still bleeding. Bleeding not heavy. Advised I wouldn't be to concerned with bleeding. That can last 4-6 weeks after delivery. Pt is not breastfeeding now. Pt is requesting something for UTI. Please advise. Thanks!! Idalou

## 2018-07-10 NOTE — Telephone Encounter (Signed)
Foul smell is not indication for treatment postpartum, she will need to bring a urine by for UA and culture  More than likely she is not drinking enough and her urine is super concentrated

## 2018-07-11 ENCOUNTER — Other Ambulatory Visit: Payer: Medicaid Other

## 2018-07-12 ENCOUNTER — Other Ambulatory Visit: Payer: Medicaid Other

## 2018-08-03 ENCOUNTER — Encounter: Payer: Self-pay | Admitting: Women's Health

## 2018-08-03 ENCOUNTER — Ambulatory Visit (INDEPENDENT_AMBULATORY_CARE_PROVIDER_SITE_OTHER): Payer: Medicaid Other | Admitting: Women's Health

## 2018-08-03 ENCOUNTER — Other Ambulatory Visit: Payer: Self-pay

## 2018-08-03 NOTE — Progress Notes (Signed)
TELEHEALTH VIRTUAL POSTPARTUM VISIT ENCOUNTER NOTE Patient name: Joanna [name] MRN [id number]  Date of birth: [birth year]  I connected with patient on 08/03/18 at  1:45 PM EDT by telephone (unable to do webex) and verified that I am speaking with the correct person using two identifiers. Due to COVID-19 recommendations, pt is not currently in our office.    I discussed the limitations, risks, security and privacy concerns of performing an evaluation and management service by telephone and the availability of in person appointments. I also discussed with the patient that there may be a patient responsible charge related to this service. The patient expressed understanding and agreed to proceed.  Chief Complaint:   Postpartum Care  History of Present Illness:   Joanna [name] is a 33 y.o. G25P2012 Caucasian female being evaluated today for a postpartum visit. She is 6 weeks postpartum following a spontaneous vaginal delivery at 37.0 gestational weeks after IOL for pre-e. Anesthesia: epidural. Laceration: none. I have fully reviewed the prenatal and intrapartum course. Pregnancy complicated by mild pre-e. D/C'd on maxzide, stopped few days ago b/c couldn't remember to take. Postpartum course has been uncomplicated. Bleeding period started 4d ago. Bowel function is normal. Bladder function is normal.  Patient is sexually active. Last sexual activity: before period started.  Contraception method is wants IUD.  Last pap 12/27/17.  Results were normal .  No LMP recorded.  Baby's course has been uncomplicated. Baby is feeding by bottle.   Edinburgh Postpartum Depression Screening: negative Edinburgh Postnatal Depression Scale - 08/03/18 1158      Edinburgh Postnatal Depression Scale:  In the Past 7 Days   I have been able to laugh and see the funny side of things.  0    I have looked forward with enjoyment to things.  0    I have blamed myself unnecessarily when things went wrong.  0    I  have been anxious or worried for no good reason.  0    I have felt scared or panicky for no good reason.  0    Things have been getting on top of me.  0    I have been so unhappy that I have had difficulty sleeping.  0    I have felt sad or miserable.  0    I have been so unhappy that I have been crying.  0    The thought of harming myself has occurred to me.  0    Edinburgh Postnatal Depression Scale Total  0      Review of Systems:   Pertinent items are noted in HPI Denies Abnormal vaginal discharge w/ itching/odor/irritation, headaches, visual changes, shortness of breath, chest pain, abdominal pain, severe nausea/vomiting, or problems with urination or bowel movements. Pertinent History Reviewed:  Reviewed past medical,surgical, obstetrical and family history.  Reviewed problem list, medications and allergies. OB History  Gravida Para Term Preterm AB Living  3 2 2  0 1 2  SAB TAB Ectopic Multiple Live Births  1 0 0 0 2    # Outcome Date GA Lbr Len/2nd Weight Sex Delivery Anes PTL Lv  3 Term 06/22/18 [redacted]w[redacted]d 06:15 / 00:09 6 lb 3.1 oz (2.81 kg) F Vag-Spont EPI  LIV     Birth Comments: wnl  2 Term 02/14/12 [redacted]w[redacted]d 12:35 / 03:12 8 lb 0.8 oz (3.65 kg) F Vag-Spont EPI N LIV     Complications: Gestational hypertension  1 SAB 02/01/09  Birth Comments: blighted ovum   Physical Assessment:  There were no vitals filed for this visit.There is no height or weight on file to calculate BMI.       Physical Examination:  General:  Alert, oriented and cooperative.   Mental Status: Normal mood and affect perceived. Normal judgment and thought content.  Rest of physical exam deferred due to type of encounter       No results found for this or any previous visit (from the past 24 hour(s)).  Assessment & Plan:  1) Postpartum exam 2) 6 wks s/p SVB after IOL for pre-e, has stopped maxzide, doesn't have bp cuff, going to mom's this afternoon, will check it there and call us back. 3)  Bottlefeeding 4) Depression screening 5) Contraception counseling, pt prefers abstinence until IUD insertion  Meds: No orders of the defined types were placed in this encounter.   I discussed the assessment and treatment plan with the patient. The patient was provided an opportunity to ask questions and all were answered. The patient agreed with the plan and demonstrated an understanding of the instructions.   The patient was advised to call back or seek an in-person evaluation/go to the ED for any concerning postpartum symptoms.  I provided 10 minutes of non-face-to-face time during this encounter.  Follow-up: Return for IUD insertion.   No orders of the defined types were placed in this encounter.   Cheral MarkerKimberly R Liberty Stead CNM, [facility] [id number] 08/03/2018 12:50 PM

## 2018-08-10 ENCOUNTER — Ambulatory Visit: Payer: Medicaid Other | Admitting: Women's Health

## 2018-08-21 ENCOUNTER — Ambulatory Visit (INDEPENDENT_AMBULATORY_CARE_PROVIDER_SITE_OTHER): Payer: Medicaid Other | Admitting: Women's Health

## 2018-08-21 ENCOUNTER — Encounter: Payer: Self-pay | Admitting: Women's Health

## 2018-08-21 ENCOUNTER — Other Ambulatory Visit: Payer: Self-pay

## 2018-08-21 VITALS — BP 120/82 | HR 82 | Ht 62.0 in | Wt 213.5 lb

## 2018-08-21 DIAGNOSIS — Z30430 Encounter for insertion of intrauterine contraceptive device: Secondary | ICD-10-CM | POA: Insufficient documentation

## 2018-08-21 DIAGNOSIS — Z3202 Encounter for pregnancy test, result negative: Secondary | ICD-10-CM | POA: Diagnosis not present

## 2018-08-21 DIAGNOSIS — N858 Other specified noninflammatory disorders of uterus: Secondary | ICD-10-CM

## 2018-08-21 DIAGNOSIS — Z30431 Encounter for routine checking of intrauterine contraceptive device: Secondary | ICD-10-CM

## 2018-08-21 LAB — POCT URINE PREGNANCY: Preg Test, Ur: NEGATIVE

## 2018-08-21 MED ORDER — LEVONORGESTREL 19.5 MCG/DAY IU IUD
INTRAUTERINE_SYSTEM | Freq: Once | INTRAUTERINE | Status: AC
  Administered 2018-08-21: 14:00:00 via INTRAUTERINE

## 2018-08-21 NOTE — Addendum Note (Signed)
Addended by: Linton Rump on: 08/21/2018 02:30 PM   Modules accepted: Orders

## 2018-08-21 NOTE — Patient Instructions (Signed)
 Nothing in vagina for 3 days (no sex, douching, tampons, etc...)  Check your strings once a month to make sure you can feel them, if you are not able to please let us know  If you develop a fever of 100.4 or more in the next few weeks, or if you develop severe abdominal pain, please let us know  Use a backup method of birth control, such as condoms, for 2 weeks    Intrauterine Device Insertion, Care After  This sheet gives you information about how to care for yourself after your procedure. Your health care provider may also give you more specific instructions. If you have problems or questions, contact your health care provider. What can I expect after the procedure? After the procedure, it is common to have:  Cramps and pain in the abdomen.  Light bleeding (spotting) or heavier bleeding that is like your menstrual period. This may last for up to a few days.  Lower back pain.  Dizziness.  Headaches.  Nausea. Follow these instructions at home:  Before resuming sexual activity, check to make sure that you can feel the IUD string(s). You should be able to feel the end of the string(s) below the opening of your cervix. If your IUD string is in place, you may resume sexual activity. ? If you had a hormonal IUD inserted more than 7 days after your most recent period started, you will need to use a backup method of birth control for 7 days after IUD insertion. Ask your health care provider whether this applies to you.  Continue to check that the IUD is still in place by feeling for the string(s) after every menstrual period, or once a month.  Take over-the-counter and prescription medicines only as told by your health care provider.  Do not drive or use heavy machinery while taking prescription pain medicine.  Keep all follow-up visits as told by your health care provider. This is important. Contact a health care provider if:  You have bleeding that is heavier or lasts longer than  a normal menstrual cycle.  You have a fever.  You have cramps or abdominal pain that get worse or do not get better with medicine.  You develop abdominal pain that is new or is not in the same area of earlier cramping and pain.  You feel lightheaded or weak.  You have abnormal or bad-smelling discharge from your vagina.  You have pain during sexual activity.  You have any of the following problems with your IUD string(s): ? The string bothers or hurts you or your sexual partner. ? You cannot feel the string. ? The string has gotten longer.  You can feel the IUD in your vagina.  You think you may be pregnant, or you miss your menstrual period.  You think you may have an STI (sexually transmitted infection). Get help right away if:  You have flu-like symptoms.  You have a fever and chills.  You can feel that your IUD has slipped out of place. Summary  After the procedure, it is common to have cramps and pain in the abdomen. It is also common to have light bleeding (spotting) or heavier bleeding that is like your menstrual period.  Continue to check that the IUD is still in place by feeling for the string(s) after every menstrual period, or once a month.  Keep all follow-up visits as told by your health care provider. This is important.  Contact your health care provider if   you have problems with your IUD string(s), such as the string getting longer or bothering you or your sexual partner. This information is not intended to replace advice given to you by your health care provider. Make sure you discuss any questions you have with your health care provider. Document Released: 09/16/2010 Document Revised: 12/31/2016 Document Reviewed: 12/10/2015 Elsevier Patient Education  2020 [employer]  

## 2018-08-21 NOTE — Progress Notes (Signed)
   IUD INSERTION Patient name: Joanna [name] MRN [id number]  Date of birth: January 22, 1986 Subjective Findings:   Joanna [name] is a 33 y.o. G51P2012 Caucasian female 74mths s/p SVB after IOL for pre-e, being seen today for insertion of a Liletta IUD. Had stopped maxzide for PPHTN, but started back b/c baby has colic and she felt like her bp was high. Hasn't checked bp. Took maxzide around 1000 today.   Patient's last menstrual period was 07/26/2018. Last sexual intercourse was 7/6 Last pap11/26/19. Results were:  normal  The risks and benefits of the method and placement have been thouroughly reviewed with the patient and all questions were answered.  Specifically the patient is aware of failure rate of 02/998, expulsion of the IUD and of possible perforation.  The patient is aware of irregular bleeding due to the method and understands the incidence of irregular bleeding diminishes with time.  Signed copy of informed consent in chart.  Pertinent History Reviewed:   Reviewed past medical,surgical, social, obstetrical and family history.  Reviewed problem list, medications and allergies. Objective Findings & Procedure:   Vitals:   08/21/18 1343  BP: 120/82  Pulse: 82  Weight: 213 lb 8 oz (96.8 kg)  Height: 5\' 2"  (1.575 m)  Body mass index is 39.05 kg/m.  Results for orders placed or performed in visit on 08/21/18 (from the past 24 hour(s))  POCT urine pregnancy   Collection Time: 08/21/18  1:46 PM  Result Value Ref Range   Preg Test, Ur Negative Negative     Time out was performed.  A graves speculum was placed in the vagina.  The cervix was visualized, prepped using Betadine, and grasped with a single tooth tenaculum. The uterus was found to be anteroflexed and it sounded to 9 cm.  Liletta IUD placed per manufacturer's recommendations. The strings were trimmed to approximately 3 cm. The patient tolerated the procedure well.   Informal transvaginal sonogram was performed and the  proper placement of the IUD was verified. Posterior cul-de-sac fluid seen, endometrium thickened like period about to start, also has ?mass on anterior part of uterus vs tube, [name] also looked- possible hydrosalpinx, pt asymptomatic, will bring back for formal u/s.   Assessment & Plan:   1) Liletta IUD insertion The patient was given post procedure instructions, including signs and symptoms of infection and to check for the strings after each menses or each month, and refraining from intercourse or anything in the vagina for 3 days. She was given a Liletta care card with date IUD placed, and date IUD to be removed. She is scheduled for a f/u appointment in 4 weeks.  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 To stop maxzide, check bp's at home, if consistently >140/90 can resume, but let us know  Orders Placed This Encounter  Procedures  . US PELVIS (TRANSABDOMINAL ONLY)  . US PELVIS TRANSVAGINAL NON-OB (TV ONLY)  . POCT urine pregnancy    Return in about 4 weeks (around 09/18/2018) for US:GYN & f/u after.  [place], [facility] 08/21/2018 2:25 PM

## 2018-09-19 ENCOUNTER — Ambulatory Visit (INDEPENDENT_AMBULATORY_CARE_PROVIDER_SITE_OTHER): Payer: Medicaid Other | Admitting: Women's Health

## 2018-09-19 ENCOUNTER — Encounter: Payer: Self-pay | Admitting: Women's Health

## 2018-09-19 ENCOUNTER — Ambulatory Visit (INDEPENDENT_AMBULATORY_CARE_PROVIDER_SITE_OTHER): Payer: Medicaid Other

## 2018-09-19 ENCOUNTER — Other Ambulatory Visit: Payer: Self-pay

## 2018-09-19 VITALS — BP 134/85 | HR 68 | Ht 62.0 in | Wt 217.8 lb

## 2018-09-19 DIAGNOSIS — N858 Other specified noninflammatory disorders of uterus: Secondary | ICD-10-CM | POA: Diagnosis not present

## 2018-09-19 DIAGNOSIS — Z30431 Encounter for routine checking of intrauterine contraceptive device: Secondary | ICD-10-CM | POA: Diagnosis not present

## 2018-09-19 DIAGNOSIS — N7011 Chronic salpingitis: Secondary | ICD-10-CM | POA: Diagnosis not present

## 2018-09-19 NOTE — Patient Instructions (Signed)
Hydrosalpinx (fluid filled tube), no current infection

## 2018-09-19 NOTE — Progress Notes (Signed)
   GYN VISIT Patient name: Joanna [name] MRN [id number]  Date of birth: 1985-06-20 Chief Complaint:   Follow-up ([place] today)  History of Present Illness:   Joanna [name] is a 33 y.o. G65P2012 Caucasian female being seen today for IUD f/u after Liletta insertion 08/21/18. On informal u/s after insertion, Lt tubular mass noted, so obtained formal u/s today. Asymptomatic. Neg gc/ct 32mths ago.  Able to feel strings, period right after insertion, now on 2d off 2d.      No LMP recorded. (Menstrual status: IUD). The current method of family planning is IUD.  Last pap 12/27/17. Results were:  normal Review of Systems:   Pertinent items are noted in HPI Denies fever/chills, dizziness, headaches, visual disturbances, fatigue, shortness of breath, chest pain, abdominal pain, vomiting, abnormal vaginal discharge/itching/odor/irritation, problems with periods, bowel movements, urination, or intercourse unless otherwise stated above.  Pertinent History Reviewed:  Reviewed past medical,surgical, social, obstetrical and family history.  Reviewed problem list, medications and allergies. Physical Assessment:   Vitals:   09/19/18 0904  BP: 134/85  Pulse: 68  Weight: 217 lb 12.8 oz (98.8 kg)  Height: 5\' 2"  (1.575 m)  Body mass index is 39.84 kg/m.       Physical Examination:   General appearance: alert, well appearing, and in no distress  Mental status: alert, oriented to person, place, and time  Skin: warm & dry   Cardiovascular: normal heart rate noted  Respiratory: normal respiratory effort, no distress  Abdomen: soft, non-tender   Pelvic: examination not indicated  Extremities: no edema   Today's PELVIC US TA/TV:homogeneous anteverted uterus,wnl,IUD is centrally located within the endometrium,EEC 7 mm,normal ovaries bilat,fluid filled tube like structure in left adnexa (? Hydrosalpinx),no free fluid,no pain during ultrasound,ovaries appear mobile     No results found for this or any  previous visit (from the past 24 hour(s)).  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   No orders of the defined types were placed in this encounter.   Return in about 1 year (around 09/19/2019) for Physical.  [place], [facility] 09/19/2018 9:39 AM

## 2018-09-19 NOTE — Progress Notes (Signed)
PELVIC US TA/TV:homogeneous anteverted uterus,wnl,IUD is centrally located within the endometrium,EEC 7 mm,normal ovaries bilat,fluid filled tube like structure in left adnexa (? Hydrosalpinx),no free fluid,no pain during ultrasound,ovaries appear mobile

## 2018-10-16 ENCOUNTER — Telehealth: Payer: Medicaid Other | Admitting: Family

## 2018-10-16 ENCOUNTER — Encounter (HOSPITAL_COMMUNITY): Payer: Self-pay

## 2018-10-16 ENCOUNTER — Emergency Department (HOSPITAL_COMMUNITY)
Admission: EM | Admit: 2018-10-16 | Discharge: 2018-10-17 | Disposition: A | Discharge status: Home / Self Care | Payer: Medicaid Other | Attending: Emergency Medicine | Admitting: Emergency Medicine

## 2018-10-16 ENCOUNTER — Other Ambulatory Visit: Payer: Self-pay

## 2018-10-16 DIAGNOSIS — Z20828 Contact with and (suspected) exposure to other viral communicable diseases: Secondary | ICD-10-CM | POA: Diagnosis not present

## 2018-10-16 DIAGNOSIS — N390 Urinary tract infection, site not specified: Secondary | ICD-10-CM | POA: Insufficient documentation

## 2018-10-16 DIAGNOSIS — F17210 Nicotine dependence, cigarettes, uncomplicated: Secondary | ICD-10-CM | POA: Diagnosis not present

## 2018-10-16 DIAGNOSIS — R42 Dizziness and giddiness: Secondary | ICD-10-CM | POA: Insufficient documentation

## 2018-10-16 DIAGNOSIS — E860 Dehydration: Secondary | ICD-10-CM | POA: Insufficient documentation

## 2018-10-16 DIAGNOSIS — R55 Syncope and collapse: Secondary | ICD-10-CM

## 2018-10-16 DIAGNOSIS — Z20822 Contact with and (suspected) exposure to covid-19: Secondary | ICD-10-CM

## 2018-10-16 MED ORDER — BENZONATATE 100 MG PO CAPS: 100.0000 mg | ORAL_CAPSULE | Freq: Three times a day (TID) | ORAL | 0 refills | Status: DC | PRN

## 2018-10-16 NOTE — ED Triage Notes (Signed)
Pt presents to ED with fatigue, diarrhea and feeling like she is going to pass out. Pt states the symptoms started today. Pt has been exposed to positive covid pt on Thursday.

## 2018-10-16 NOTE — Progress Notes (Signed)
E-Visit for Corona Virus Screening   Your current symptoms could be consistent with the coronavirus.  Many health care providers can now test patients at their office but not all are.  [facility] has multiple testing sites. For information on our COVID testing locations and hours go to [email or web address]  Please quarantine yourself while awaiting your test results.  We are enrolling you in our MyChart Home Montioring for COVID19 . Daily you will receive a questionnaire within the MyChart website. Our COVID 19 response team willl be monitoriing your responses daily.  You can go to one of the  testing sites listed below, while they are opened (see hours). You do not need an order and will stay in your car during the test. You do need to self isolate until your results return and if positive 14 days from when your symptoms started and until you are 3 days symptom free.   Testing Locations (Monday - Friday, 8 a.m. - 3:30 p.m.) . [place]: [facility], [address], [place], [place]  . [place]: [facility], [address], [place],  (entrance off [address])  . [place]: (Closed each Monday): Testing site relocated to the short stay covered drive at [facility]. (Use the [address] entrance to [facility] next to [facility].) Approximately 5 minutes was spent documenting and reviewing patient's chart.    COVID-19 is a respiratory illness with symptoms that are similar to the flu. Symptoms are typically mild to moderate, but there have been cases of severe illness and death due to the virus. The following symptoms may appear 2-14 days after exposure: . Fever . Cough . Shortness of breath or difficulty breathing . Chills . Repeated shaking with chills . Muscle pain . Headache . Sore throat . New loss of taste or smell . Fatigue . Congestion or runny  nose . Nausea or vomiting . Diarrhea  It is vitally important that if you feel that you have an infection such as this virus or any other virus that you stay home and away from places where you may spread it to others.  You should self-quarantine for 14 days if you have symptoms that could potentially be coronavirus or have been in close contact a with a person diagnosed with COVID-19 within the last 2 weeks. You should avoid contact with people age 65 and older.   You should wear a mask or cloth face covering over your nose and mouth if you must be around other people or animals, including pets (even at home). Try to stay at least 6 feet away from other people. This will protect the people around you.  You can use medication such as A prescription cough medication called Tessalon Perles 100 mg. You may take 1-2 capsules every 8 hours as needed for cough  You may also take acetaminophen (Tylenol) as needed for fever.   Reduce your risk of any infection by using the same precautions used for avoiding the common cold or flu:  . Wash your hands often with soap and warm water for at least 20 seconds.  If soap and water are not readily available, use an alcohol-based hand sanitizer with at least 60% alcohol.  . If coughing or sneezing, cover your mouth and nose by coughing or sneezing into the elbow areas of your shirt or coat, into a tissue or into your sleeve (not your hands). . Avoid shaking hands with others   and consider head nods or verbal greetings only. . Avoid touching your eyes, nose, or mouth with unwashed hands.  . Avoid close contact with people who are sick. . Avoid places or events with large numbers of people in one location, like concerts or sporting events. . Carefully consider travel plans you have or are making. . If you are planning any travel outside or inside the [place], visit the CDC's Travelers' Health webpage for the latest health notices. . If you have some symptoms but not all  symptoms, continue to monitor at home and seek medical attention if your symptoms worsen. . If you are having a medical emergency, call 911.  HOME CARE . Only take medications as instructed by your medical team. . Drink plenty of fluids and get plenty of rest. . A steam or ultrasonic humidifier can help if you have congestion.   GET HELP RIGHT AWAY IF YOU HAVE EMERGENCY WARNING SIGNS** FOR COVID-19. If you or someone is showing any of these signs seek emergency medical care immediately. Call 911 or proceed to your closest emergency facility if: . You develop worsening high fever. . Trouble breathing . Bluish lips or face . Persistent pain or pressure in the chest . New confusion . Inability to wake or stay awake . You cough up blood. . Your symptoms become more severe  **This list is not all possible symptoms. Contact your medical provider for any symptoms that are sever or concerning to you.   MAKE SURE YOU   Understand these instructions.  Will watch your condition.  Will get help right away if you are not doing well or get worse.  Your e-visit answers were reviewed by a board certified advanced clinical practitioner to complete your personal care plan.  Depending on the condition, your plan could have included both over the counter or prescription medications.  If there is a problem please reply once you have received a response from your provider.  Your safety is important to us.  If you have drug allergies check your prescription carefully.    You can use MyChart to ask questions about today's visit, request a non-urgent call back, or ask for a work or school excuse for 24 hours related to this e-Visit. If it has been greater than 24 hours you will need to follow up with your provider, or enter a new e-Visit to address those concerns. You will get an e-mail in the next two days asking about your experience.  I hope that your e-visit has been valuable and will speed your  recovery. Thank you for using e-visits.    

## 2018-10-17 LAB — CBC WITH DIFFERENTIAL/PLATELET
Abs Immature Granulocytes: 0.02 10*3/uL (ref 0.00–0.07)
Basophils Absolute: 0 10*3/uL (ref 0.0–0.1)
Basophils Relative: 0 %
Eosinophils Absolute: 0.2 10*3/uL (ref 0.0–0.5)
Eosinophils Relative: 2 %
HCT: 38.9 % (ref 36.0–46.0)
Hemoglobin: 12 g/dL (ref 12.0–15.0)
Immature Granulocytes: 0 %
Lymphocytes Relative: 28 %
Lymphs Abs: 2.9 10*3/uL (ref 0.7–4.0)
MCH: 27.5 pg (ref 26.0–34.0)
MCHC: 30.8 g/dL (ref 30.0–36.0)
MCV: 89 fL (ref 80.0–100.0)
Monocytes Absolute: 1 10*3/uL (ref 0.1–1.0)
Monocytes Relative: 10 %
Neutro Abs: 6.4 10*3/uL (ref 1.7–7.7)
Neutrophils Relative %: 60 %
Platelets: 202 10*3/uL (ref 150–400)
RBC: 4.37 MIL/uL (ref 3.87–5.11)
RDW: 14.6 % (ref 11.5–15.5)
WBC: 10.5 10*3/uL (ref 4.0–10.5)
nRBC: 0 % (ref 0.0–0.2)

## 2018-10-17 LAB — COMPREHENSIVE METABOLIC PANEL
ALT: 21 U/L (ref 0–44)
AST: 19 U/L (ref 15–41)
Albumin: 4 g/dL (ref 3.5–5.0)
Alkaline Phosphatase: 79 U/L (ref 38–126)
Anion gap: 8 (ref 5–15)
BUN: 10 mg/dL (ref 6–20)
CO2: 21 mmol/L — ABNORMAL LOW (ref 22–32)
Calcium: 8.7 mg/dL — ABNORMAL LOW (ref 8.9–10.3)
Chloride: 108 mmol/L (ref 98–111)
Creatinine, Ser: 0.73 mg/dL (ref 0.44–1.00)
GFR calc Af Amer: >60 mL/min (ref >60)
GFR calc non Af Amer: >60 mL/min (ref >60)
Glucose, Bld: 92 mg/dL (ref 70–99)
Potassium: 3.5 mmol/L (ref 3.5–5.1)
Sodium: 137 mmol/L (ref 135–145)
Total Bilirubin: 0.4 mg/dL (ref 0.3–1.2)
Total Protein: 7.4 g/dL (ref 6.5–8.1)

## 2018-10-17 LAB — URINALYSIS, ROUTINE W REFLEX MICROSCOPIC
Bilirubin Urine: NEGATIVE
Glucose, UA: NEGATIVE mg/dL
Ketones, ur: NEGATIVE mg/dL
Nitrite: POSITIVE — AB
Protein, ur: NEGATIVE mg/dL
Specific Gravity, Urine: 1.009 (ref 1.005–1.030)
pH: 6 (ref 5.0–8.0)

## 2018-10-17 LAB — LIPASE, BLOOD: Lipase: 37 U/L (ref 11–51)

## 2018-10-17 LAB — POC URINE PREG, ED: Preg Test, Ur: NEGATIVE

## 2018-10-17 MED ORDER — DIPHENOXYLATE-ATROPINE 2.5-0.025 MG PO TABS: 1.0000 | ORAL_TABLET | Freq: Four times a day (QID) | ORAL | 0 refills | Status: DC | PRN

## 2018-10-17 MED ORDER — NITROFURANTOIN MONOHYD MACRO 100 MG PO CAPS
100.0000 mg | ORAL_CAPSULE | Freq: Two times a day (BID) | ORAL | Status: DC
  Administered 2018-10-17: 100 mg via ORAL
  Filled 2018-10-17: qty 1

## 2018-10-17 MED ORDER — DIPHENOXYLATE-ATROPINE 2.5-0.025 MG PO TABS
2.0000 | ORAL_TABLET | Freq: Once | ORAL | Status: AC
  Administered 2018-10-17: 2 via ORAL
  Filled 2018-10-17: qty 2

## 2018-10-17 MED ORDER — SODIUM CHLORIDE 0.9 % IV BOLUS
1000.0000 mL | Freq: Once | INTRAVENOUS | Status: AC
  Administered 2018-10-17: 1000 mL via INTRAVENOUS

## 2018-10-17 MED ORDER — NITROFURANTOIN MONOHYD MACRO 100 MG PO CAPS: 100.0000 mg | ORAL_CAPSULE | Freq: Two times a day (BID) | ORAL | 0 refills | Status: DC

## 2018-10-17 NOTE — ED Provider Notes (Signed)
[name]   CSN: [id number] Arrival date & time: 10/16/18  2132     History   Chief Complaint Chief Complaint  Patient presents with  . Near Syncope    HPI Joanna [name] is a 33 y.o. female.     Patient reports that she has felt sick all day.  She has had generalized weakness and feeling like she is going to pass out.  She has been experiencing diarrhea throughout the day but no nausea or vomiting.  She has had some left upper abdominal pain and left flank pain but denies urinary symptoms.  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     Past Medical History:  Diagnosis Date  . 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 Normal delivery 02/14/2012  . Pericardial cyst   . Pregnancy induced hypertension   . Thoracic cyst     Patient Active Problem List   Diagnosis Date Noted  . Hydrosalpinx 09/19/2018  . Encounter for IUD insertion 08/21/2018  . H/O preeclampsia and GHTN 06/22/2018  . Smoker 12/27/2017  . Right ovarian cyst 11/17/2017  . Depression 11/17/2017  . 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 Acute nonintractable headache 09/24/2015    Past Surgical History:  Procedure Laterality Date  . TONSILLECTOMY AND ADENOIDECTOMY       OB History    Gravida  3   Para  2   Term  2   Preterm  0   AB  1   Living  2     SAB  1   TAB  0   Ectopic  0   Multiple  0   Live Births  2            Home Medications    Prior to Admission medications   Medication Sig Start Date End Date Taking? Authorizing Provider  benzonatate (TESSALON PERLES) 100 MG capsule Take 1 capsule (100 mg total) by mouth 3 (three) times daily as needed. 10/16/18   Junie SpencerHawks, Christy A, FNP  diphenoxylate-atropine (LOMOTIL) 2.5-0.025 MG tablet Take 1-2 tablets by mouth 4 (four) times daily as needed for diarrhea or loose stools. 10/17/18   Gilda CreasePollina, Christopher J, MD  nitrofurantoin,  macrocrystal-monohydrate, (MACROBID) 100 MG capsule Take 1 capsule (100 mg total) by mouth 2 (two) times daily. X 7 days 10/17/18   Gilda CreasePollina, Christopher J, MD  Prenatal Vit-Fe Fumarate-FA (PRENATAL VITAMIN PO) Take 1 tablet by mouth daily.     [provider]    Family History Family History  Problem Relation Age of Onset  . Hepatitis Mother   . Diabetes Mother        borderline  . Heart disease Mother   . Depression Mother   . Hyperlipidemia Father   . AAA (abdominal aortic aneurysm) Paternal Grandfather   . Graves' disease Maternal Aunt     Social History Social History   Tobacco Use  . Smoking status: Current Some Day Smoker    Packs/day: 0.25    Years: 10.00    Pack years: 2.50    Types: Cigarettes    Last attempt to quit: 02/18/2018    Years since quitting: 0.6  . Smokeless tobacco: Never Used  . Tobacco comment: 2-3 cigarettes daily, pt trying to quit  Substance Use Topics  . Alcohol use: Never    Frequency: Never  . Drug use: No  Allergies   Amoxicillin and Latex   Review of Systems Review of Systems  Gastrointestinal: Positive for abdominal pain and diarrhea. Negative for blood in stool.  Genitourinary: Positive for flank pain.  All other systems reviewed and are negative.    Physical Exam Updated Vital Signs BP (!) 142/78 (BP Location: Right Arm)   Pulse 76   Temp 98.5 F (36.9 C) (Oral)   Resp 16   Ht 5\' 2"  (1.575 m)   Wt 95.7 kg   SpO2 99%   BMI 38.59 kg/m   Physical Exam Vitals signs and nursing note reviewed.  Constitutional:      General: She is not in acute distress.    Appearance: Normal appearance. She is well-developed.  HENT:     Head: Normocephalic and atraumatic.     Right Ear: Hearing normal.     Left Ear: Hearing normal.     Nose: Nose normal.  Eyes:     Conjunctiva/sclera: Conjunctivae normal.     Pupils: Pupils are equal, round, and reactive to light.  Neck:     Musculoskeletal: Normal range of motion and  neck supple.  Cardiovascular:     Rate and Rhythm: Regular rhythm.     Heart sounds: S1 normal and S2 normal. No murmur. No friction rub. No gallop.   Pulmonary:     Effort: Pulmonary effort is normal. No respiratory distress.     Breath sounds: Normal breath sounds.  Chest:     Chest wall: No tenderness.  Abdominal:     General: Bowel sounds are normal.     Palpations: Abdomen is soft.     Tenderness: There is no abdominal tenderness. There is no guarding or rebound. Negative signs include Murphy's sign and McBurney's sign.     Hernia: No hernia is present.  Musculoskeletal: Normal range of motion.  Skin:    General: Skin is warm and dry.     Findings: No rash.  Neurological:     Mental Status: She is alert and oriented to person, place, and time.     GCS: GCS eye subscore is 4. GCS verbal subscore is 5. GCS motor subscore is 6.     Cranial Nerves: No cranial nerve deficit.     Sensory: No sensory deficit.     Coordination: Coordination normal.  Psychiatric:        Speech: Speech normal.        Behavior: Behavior normal.        Thought Content: Thought content normal.      ED Treatments / Results  Labs (all labs ordered are listed, but only abnormal results are displayed) Labs Reviewed  COMPREHENSIVE METABOLIC PANEL - Abnormal; Notable for the following components:      Result Value   CO2 21 (*)    Calcium 8.7 (*)    All other components within normal limits  URINALYSIS, ROUTINE W REFLEX MICROSCOPIC - Abnormal; Notable for the following components:   APPearance HAZY (*)    Hgb urine dipstick MODERATE (*)    Nitrite POSITIVE (*)    Leukocytes,Ua SMALL (*)    Bacteria, UA FEW (*)    All other components within normal limits  NOVEL CORONAVIRUS, NAA (HOSP ORDER, SEND-OUT TO REF LAB; TAT 18-24 HRS)  CBC WITH DIFFERENTIAL/PLATELET  LIPASE, BLOOD  POC URINE PREG, ED    EKG None  Radiology No results found.  Procedures Procedures (including critical care time)   Medications Ordered in ED Medications  nitrofurantoin (macrocrystal-monohydrate) (  MACROBID) capsule 100 mg (has no administration in time range)  diphenoxylate-atropine (LOMOTIL) 2.5-0.025 MG per tablet 2 tablet (has no administration in time range)  sodium chloride 0.9 % bolus 1,000 mL (0 mLs Intravenous Stopped 10/17/18 0141)     Initial Impression / Assessment and Plan / ED Course  I have reviewed the triage vital signs and the nursing notes.  Pertinent labs & imaging results that were available during my care of the patient were reviewed by me and considered in my medical decision making (see chart for details).        Patient presents to the emergency department for evaluation of generalized fatigue associated with diarrhea.  She reports that she feels weak, dizzy and feels like she is going to pass out.  She did have a recent COVID exposure.  She does not have any URI symptoms.  No associated nausea or vomiting.  She is experiencing some pain in the left upper abdomen with her diarrhea.  Exam, however, is benign and nontender.  No lower abdominal tenderness, no signs of acute surgical process.  Blood work was unremarkable.  She does, however, have signs of infection on urinalysis.  Final Clinical Impressions(s) / ED Diagnoses   Final diagnoses:  Near syncope  Dehydration  Urinary tract infection without hematuria, site unspecified    ED Discharge Orders         Ordered    diphenoxylate-atropine (LOMOTIL) 2.5-0.025 MG tablet  4 times daily PRN     10/17/18 0324    nitrofurantoin, macrocrystal-monohydrate, (MACROBID) 100 MG capsule  2 times daily     10/17/18 0324           Gilda Crease, MD 10/17/18 [phone]

## 2018-10-18 LAB — NOVEL CORONAVIRUS, NAA (HOSP ORDER, SEND-OUT TO REF LAB; TAT 18-24 HRS): SARS-CoV-2, NAA: NOT DETECTED

## 2018-10-25 ENCOUNTER — Telehealth: Payer: Self-pay

## 2018-10-25 NOTE — Telephone Encounter (Signed)
Attempted to contact patient regarding Mychart questionnaire and there was no answer. VM left for patient to contact us or to logon and complete questionnaire.

## 2018-11-10 ENCOUNTER — Other Ambulatory Visit: Payer: Self-pay

## 2018-11-10 DIAGNOSIS — Z20822 Contact with and (suspected) exposure to covid-19: Secondary | ICD-10-CM

## 2018-11-12 LAB — NOVEL CORONAVIRUS, NAA: SARS-CoV-2, NAA: NOT DETECTED

## 2019-04-19 ENCOUNTER — Other Ambulatory Visit: Payer: Self-pay | Admitting: *Deleted

## 2019-04-19 ENCOUNTER — Telehealth (HOSPITAL_COMMUNITY): Payer: Self-pay

## 2019-04-19 DIAGNOSIS — M25569 Pain in unspecified knee: Secondary | ICD-10-CM

## 2019-04-19 NOTE — Telephone Encounter (Signed)

## 2019-04-20 ENCOUNTER — Encounter: Payer: Self-pay | Admitting: Vascular Surgery

## 2019-04-20 ENCOUNTER — Other Ambulatory Visit: Payer: Self-pay

## 2019-04-20 ENCOUNTER — Ambulatory Visit (INDEPENDENT_AMBULATORY_CARE_PROVIDER_SITE_OTHER): Payer: Medicaid Other | Admitting: Vascular Surgery

## 2019-04-20 ENCOUNTER — Ambulatory Visit (HOSPITAL_COMMUNITY)
Admission: RE | Admit: 2019-04-20 | Discharge: 2019-04-20 | Disposition: A | Discharge status: Home / Self Care | Payer: Medicaid Other | Source: Ambulatory Visit | Attending: Vascular Surgery | Admitting: Vascular Surgery

## 2019-04-20 VITALS — BP 120/82 | HR 79 | Temp 98.0°F | Resp 20 | Ht 62.0 in | Wt 210.0 lb

## 2019-04-20 DIAGNOSIS — M7989 Other specified soft tissue disorders: Secondary | ICD-10-CM | POA: Diagnosis not present

## 2019-04-20 DIAGNOSIS — M25569 Pain in unspecified knee: Secondary | ICD-10-CM | POA: Insufficient documentation

## 2019-04-20 NOTE — Progress Notes (Signed)
Patient ID: Gara Kroner, female   DOB: Oct 22, 1985, 33 y.o.   MRN: [id number]  Reason for Consult: New Patient (Initial Visit)   Referred by Redmond School, MD  Subjective:     HPI:  MANJIT BUFANO is a 34 y.o. female has long history of left lower extremity swelling.  Denies any history of right lower extremity swelling.  She states that she has been wearing TED hose which do help somewhat.  She was recently pregnant this did not exacerbate her symptoms.  She has never had blood clot.  She states that she has knots on her left side and has been evaluated for pericardial cyst in the past.  She is otherwise been healthy without any vascular disease.  She does smoke cigarettes.  She does not have plans to get pregnant again.  She has a recent intentional 10 pound weight loss.  States that her leg feels heavy particularly after being on her feet while caring for her children also this is quite debilitating to her.  She denies any skin changes tissue loss or ulceration.  Past Medical History:  Diagnosis Date  . Meningitis spinal    34 years old  . Normal delivery 02/14/2012  . Pericardial cyst   . Pregnancy induced hypertension   . Thoracic cyst    Family History  Problem Relation Age of Onset  . Hepatitis Mother   . Diabetes Mother        borderline  . Heart disease Mother   . Depression Mother   . Hyperlipidemia Father   . AAA (abdominal aortic aneurysm) Paternal Grandfather   . Graves' disease Maternal Aunt    Past Surgical History:  Procedure Laterality Date  . TONSILLECTOMY AND ADENOIDECTOMY      Short Social History:  Social History   Tobacco Use  . Smoking status: Current Some Day Smoker    Packs/day: 0.25    Years: 10.00    Pack years: 2.50    Types: Cigarettes    Last attempt to quit: 02/18/2018    Years since quitting: 1.1  . Smokeless tobacco: Never Used  . Tobacco comment: 2-3 cigarettes daily, pt trying to quit  Substance Use Topics  . Alcohol use:  Never    Allergies  Allergen Reactions  . Amoxicillin Swelling and Other (See Comments)    Tongue/facial swelling, burning sensation Has patient had a PCN reaction causing immediate rash, facial/tongue/throat swelling, SOB or lightheadedness with hypotension: No Has patient had a PCN reaction causing severe rash involving mucus membranes or skin necrosis: No Has patient had a PCN reaction that required hospitalization: No Has patient had a PCN reaction occurring within the last 10 years: No If all of the above answers are "NO", then may proceed with Cephalosporin use.   . Latex Rash    rash    Current Outpatient Medications  Medication Sig Dispense Refill  . Apple Cider Vinegar 600 MG CAPS Take by mouth.    . Multiple Vitamin (MULTIVITAMIN) capsule Take 1 capsule by mouth daily.     No current facility-administered medications for this visit.    Review of Systems  Constitutional:  Constitutional negative. HENT: HENT negative.  Eyes: Eyes negative.  Respiratory: Respiratory negative.  Cardiovascular: Positive for leg swelling.  GI: Gastrointestinal negative.  Musculoskeletal:       Knots on her left side Skin: Skin negative.  Neurological: Neurological negative. Hematologic: Hematologic/lymphatic negative.  Psychiatric: Psychiatric negative.        Objective:  Objective  Vitals:   04/20/19 1435  BP: 120/82  Pulse: 79  Resp: 20  Temp: 98 F (36.7 C)  SpO2: 100%    Physical Exam Constitutional:      Appearance: She is obese.  HENT:     Head: Normocephalic.     Mouth/Throat:     Mouth: Mucous membranes are moist.  Eyes:     Pupils: Pupils are equal, round, and reactive to light.  Cardiovascular:     Rate and Rhythm: Normal rate.     Pulses: Normal pulses.  Pulmonary:     Effort: Pulmonary effort is normal.  Abdominal:     General: Abdomen is flat.     Palpations: Abdomen is soft. There is no mass.  Musculoskeletal:        General: Normal range of  motion.     Cervical back: Normal range of motion and neck supple.     Right lower leg: No edema.     Left lower leg: Edema present.  Skin:    General: Skin is warm and dry.  Neurological:     General: No focal deficit present.     Mental Status: She is alert.  Psychiatric:        Mood and Affect: Mood normal.        Behavior: Behavior normal.        Thought Content: Thought content normal.        Judgment: Judgment normal.     Data: I have independently interpreted her lower extremity venous reflux study.  She has reflux throughout her greater saphenous vein in the thigh and the proximal calf.  Greatest diameter 0.42 in the distal thigh.  She does have reflux noted in the common femoral vein on the left     Assessment/Plan:     34 year old female with left lower extremity swelling without any right lower extremity swelling.  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  She denies any history of DVT.  Reflux study demonstrates deep venous reflux no significant superficial venous reflux.  I have recommended compression stockings.  I discussed with her the options being no intervention versus venogram versus CT venogram.  I do believe we would get the most information with CT venogram she agrees to proceed with this and I will see her afterwards to discuss our options moving forward.  I discussed with her possible outcomes including we may not have a diagnosis she may be best served with continued weight loss and compression stockings.     Maeola Harman MD [facility]

## 2019-04-26 ENCOUNTER — Other Ambulatory Visit: Payer: Self-pay

## 2019-04-26 DIAGNOSIS — M7989 Other specified soft tissue disorders: Secondary | ICD-10-CM

## 2019-05-21 ENCOUNTER — Other Ambulatory Visit: Payer: Self-pay

## 2019-05-21 ENCOUNTER — Ambulatory Visit
Admission: RE | Admit: 2019-05-21 | Discharge: 2019-05-21 | Disposition: A | Discharge status: Home / Self Care | Payer: Medicaid Other | Source: Ambulatory Visit | Attending: Vascular Surgery | Admitting: Vascular Surgery

## 2019-05-21 DIAGNOSIS — M7989 Other specified soft tissue disorders: Secondary | ICD-10-CM

## 2019-05-21 MED ORDER — IOPAMIDOL (ISOVUE-370) INJECTION 76%
75.0000 mL | Freq: Once | INTRAVENOUS | Status: AC | PRN
  Administered 2019-05-21: 75 mL via INTRAVENOUS

## 2019-05-23 ENCOUNTER — Other Ambulatory Visit: Payer: Medicaid Other

## 2019-05-24 ENCOUNTER — Telehealth (HOSPITAL_COMMUNITY): Payer: Self-pay

## 2019-05-24 NOTE — Telephone Encounter (Signed)

## 2019-05-25 ENCOUNTER — Ambulatory Visit: Payer: Medicaid Other | Admitting: Vascular Surgery

## 2019-07-27 ENCOUNTER — Ambulatory Visit: Payer: Medicaid Other | Admitting: Vascular Surgery

## 2019-11-21 ENCOUNTER — Encounter: Payer: Self-pay | Admitting: Obstetrics and Gynecology

## 2019-11-21 ENCOUNTER — Ambulatory Visit (INDEPENDENT_AMBULATORY_CARE_PROVIDER_SITE_OTHER): Payer: Medicaid Other | Admitting: Obstetrics and Gynecology

## 2019-11-21 DIAGNOSIS — N930 Postcoital and contact bleeding: Secondary | ICD-10-CM

## 2019-11-21 MED ORDER — DOXYCYCLINE HYCLATE 100 MG PO CAPS: 100.0000 mg | ORAL_CAPSULE | Freq: Two times a day (BID) | ORAL | 0 refills | Status: DC

## 2019-11-21 NOTE — Progress Notes (Signed)
Joanna [name] presents with c/o PCB for the last 3 months. No pain with intercourse. Same partner [place] IUD placed last yr. Occ spotting with IUD since insertion.  PE AF VSS Lungs clear Heart RRR Abd soft + BS GU Nl EGBUS, no bleeding noted, cervix without lesions, unable to see IUD string. Uterus small mobile non tender no masses  A/P PCB  Will check GYN U/S. Doxycycline x 10 days. F/U per U/S results.

## 2019-11-29 ENCOUNTER — Ambulatory Visit (INDEPENDENT_AMBULATORY_CARE_PROVIDER_SITE_OTHER): Payer: Medicaid Other

## 2019-11-29 ENCOUNTER — Other Ambulatory Visit: Payer: Self-pay

## 2019-11-29 DIAGNOSIS — N930 Postcoital and contact bleeding: Secondary | ICD-10-CM | POA: Diagnosis not present

## 2019-11-29 DIAGNOSIS — Z30431 Encounter for routine checking of intrauterine contraceptive device: Secondary | ICD-10-CM | POA: Diagnosis not present

## 2019-11-29 NOTE — Progress Notes (Signed)
PELVIC US TA/TV: homogeneous anteverted uterus,wnl,EEC 6.4 mm,IUD is centrally located within the uterus,normal ovaries,5.8 x 2.2 x 2.2 cm fluid filled tube like structure left adnexa(? Hydrosalpinx),ovaries appear mobile,no pain during ultrasound,no free fluid

## 2019-11-30 ENCOUNTER — Ambulatory Visit (HOSPITAL_COMMUNITY): Admission: RE | Admit: 2019-11-30 | Payer: Medicaid Other | Source: Ambulatory Visit

## 2020-05-08 IMAGING — DX DG CHEST 2V
2 series · 2 of 2 positions shown · non-contrast
Comparison: 04/04/2017 chest radiograph

CLINICAL DATA: 31 y/o F; swelling and pressure to the area
underneath the left breast, left lower chest, left-sided collarbone
for 1 year on and off. Cough for a few days.

EXAM:
CHEST - 2 VIEW

[chest pa]
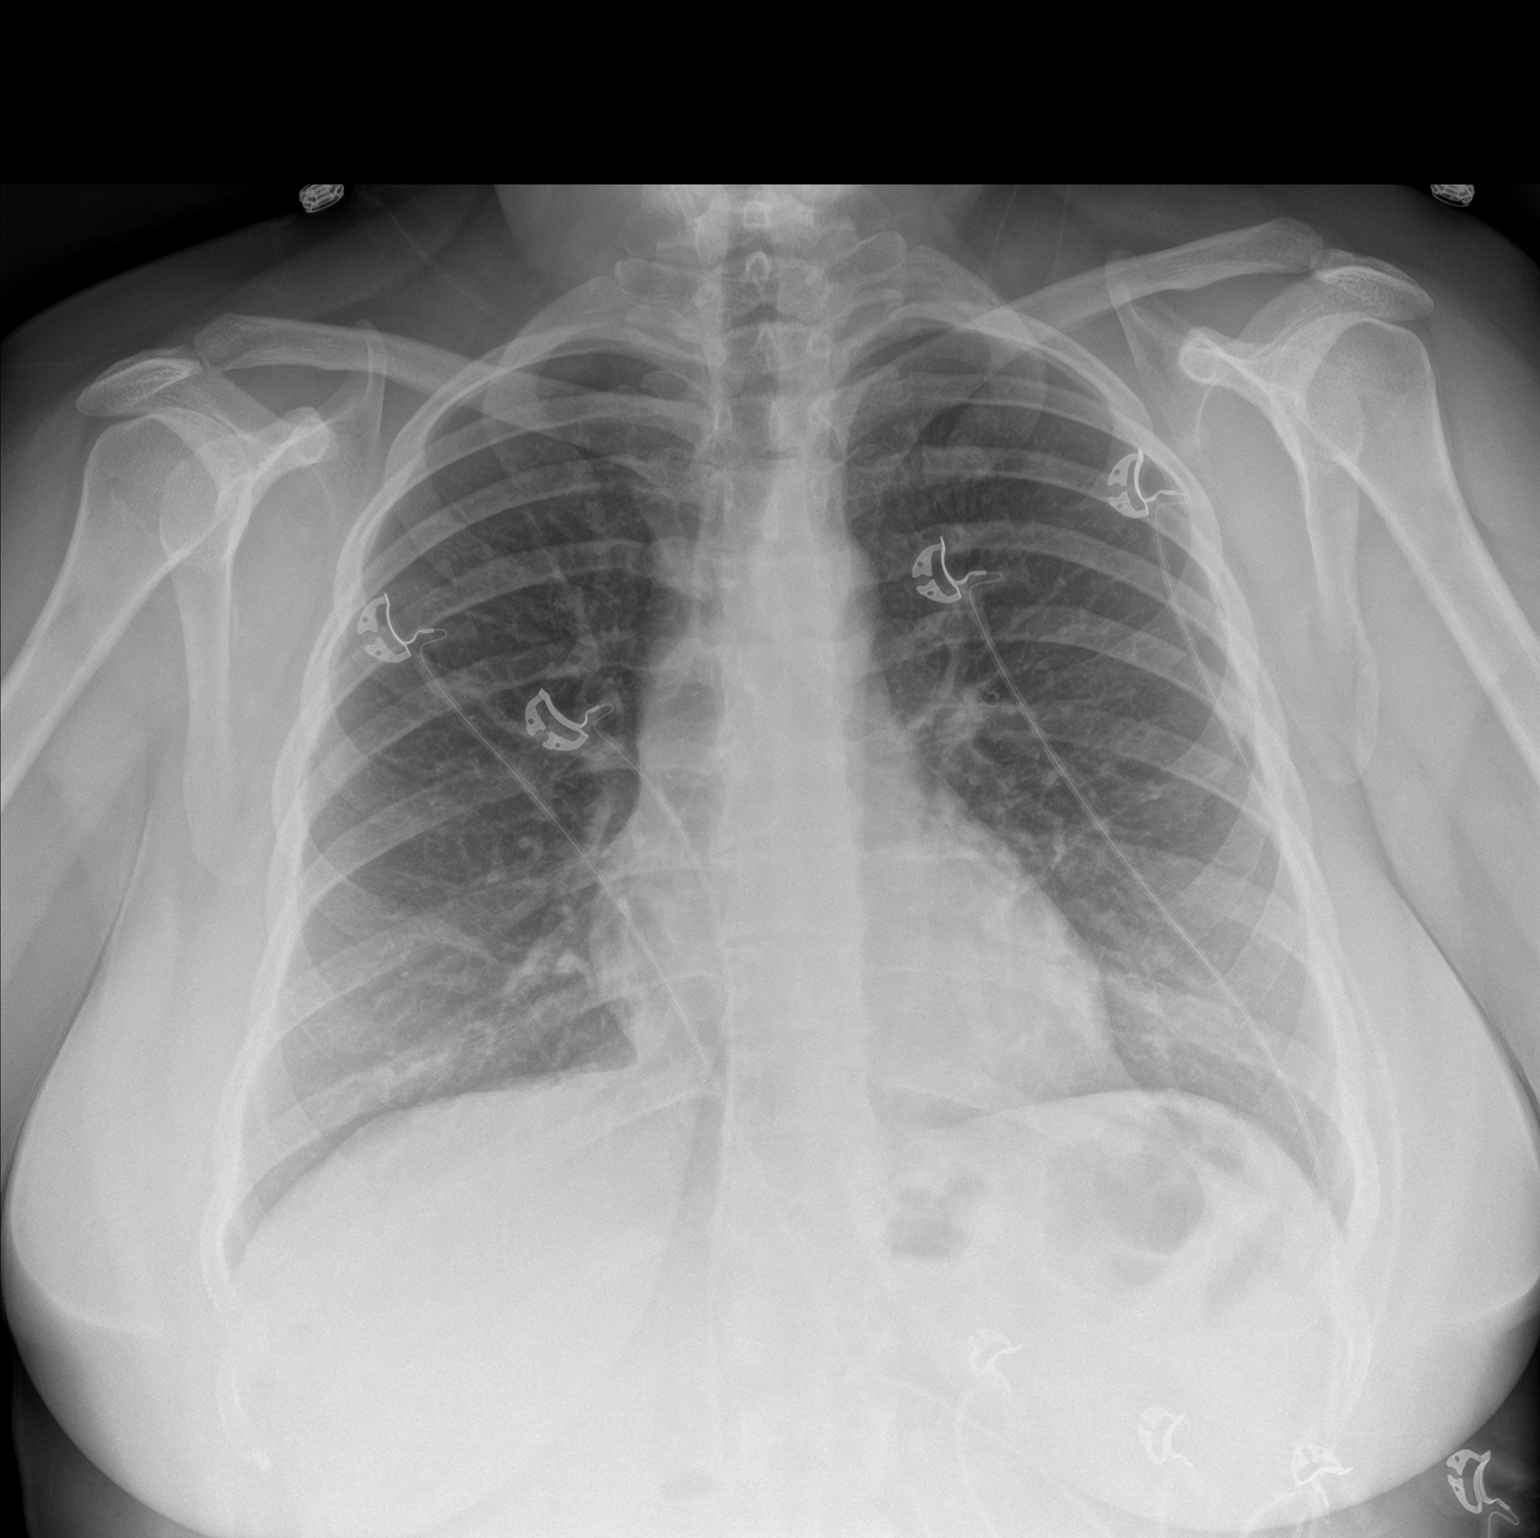

[chest lat]
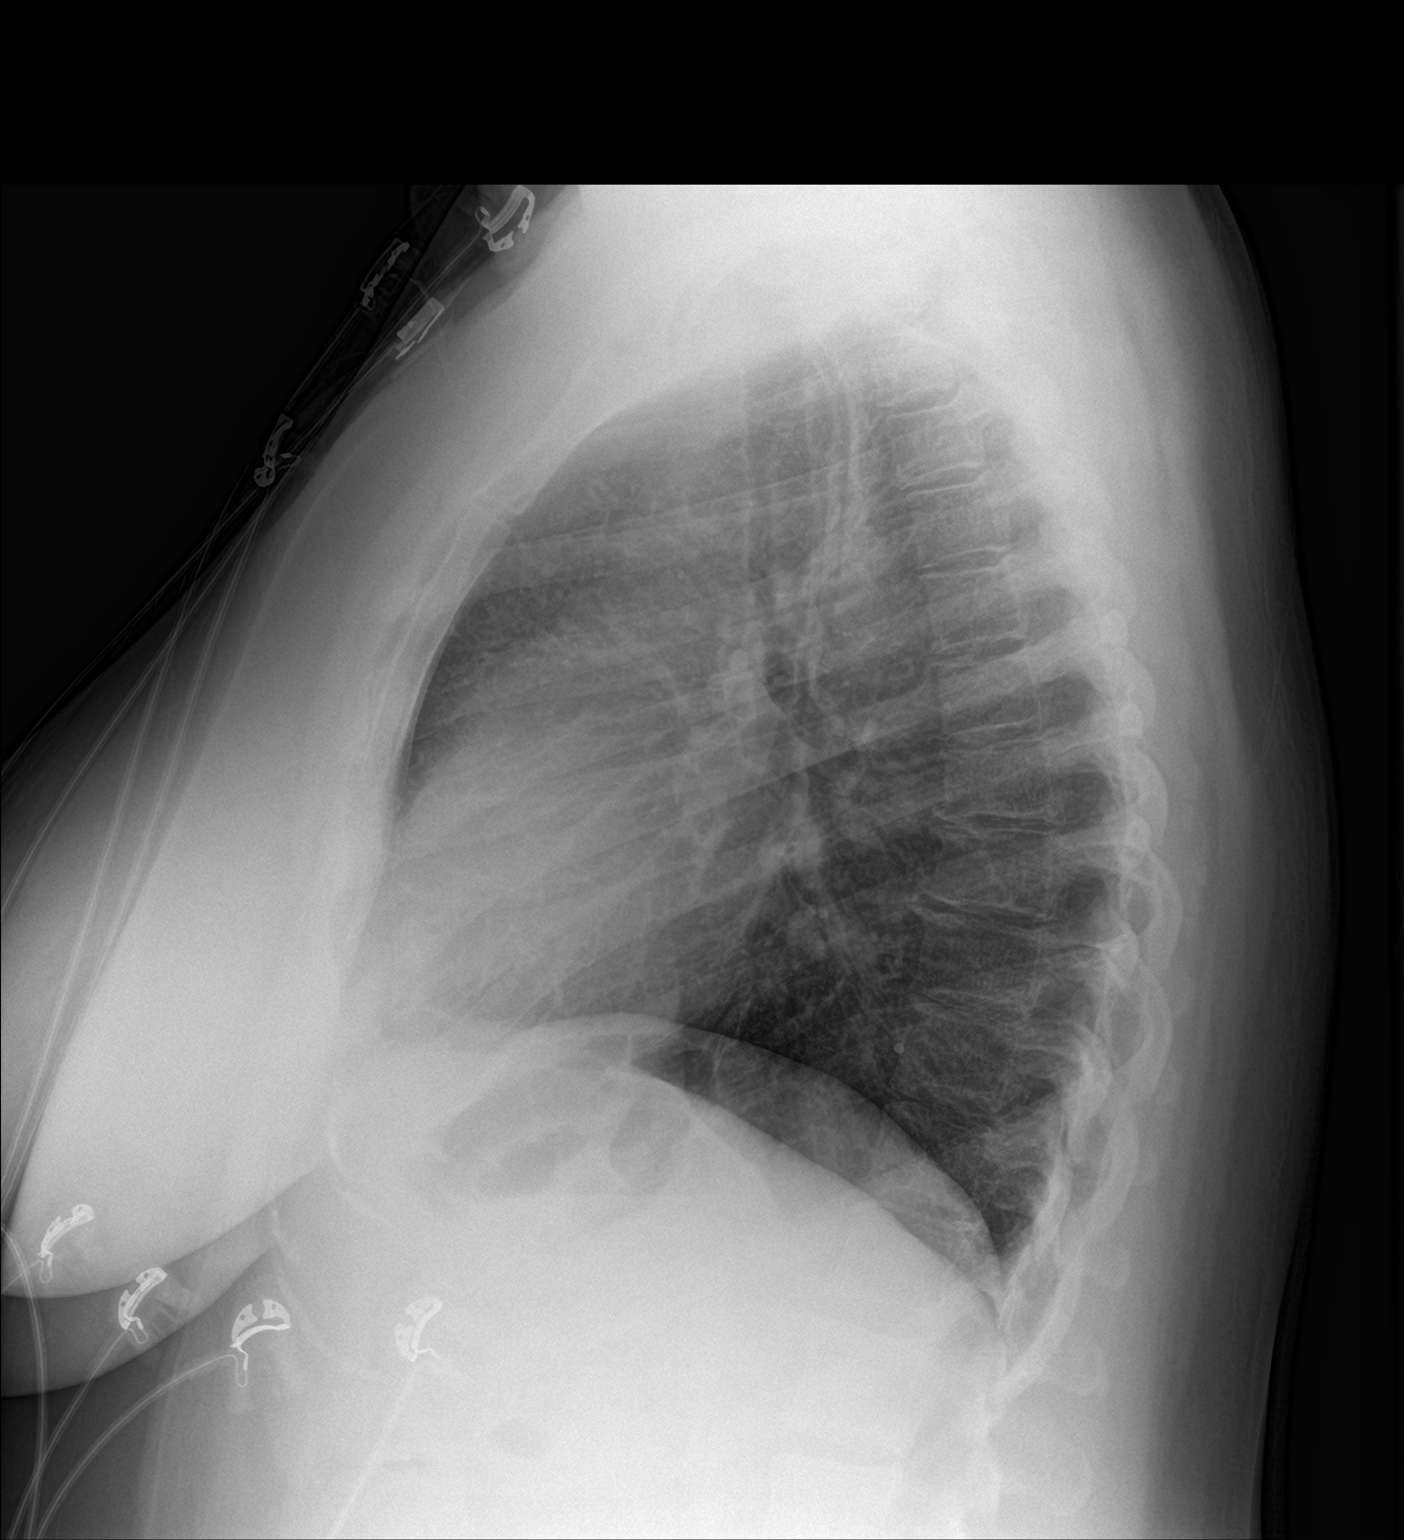

[2 of 2 positions shown; findings below may reference images not displayed]

FINDINGS: Stable heart size and mediastinal contours are within normal limits.
Both lungs are clear. The visualized skeletal structures are
unremarkable.
IMPRESSION: No acute pulmonary process identified.

By: Gangadar Warkar M.D.

## 2020-07-09 ENCOUNTER — Ambulatory Visit: Payer: Medicaid Other | Admitting: Advanced Practice Midwife

## 2020-07-14 ENCOUNTER — Ambulatory Visit (INDEPENDENT_AMBULATORY_CARE_PROVIDER_SITE_OTHER): Payer: Medicaid Other | Admitting: Women's Health

## 2020-07-14 ENCOUNTER — Encounter: Payer: Self-pay | Admitting: Women's Health

## 2020-07-14 ENCOUNTER — Other Ambulatory Visit: Payer: Self-pay

## 2020-07-14 ENCOUNTER — Other Ambulatory Visit (HOSPITAL_COMMUNITY)
Admission: RE | Admit: 2020-07-14 | Discharge: 2020-07-14 | Disposition: A | Discharge status: Home / Self Care | Payer: Medicaid Other | Source: Ambulatory Visit | Attending: Advanced Practice Midwife | Admitting: Advanced Practice Midwife

## 2020-07-14 VITALS — BP 126/77 | HR 59 | Ht 62.0 in | Wt 194.2 lb

## 2020-07-14 DIAGNOSIS — Z01419 Encounter for gynecological examination (general) (routine) without abnormal findings: Secondary | ICD-10-CM | POA: Insufficient documentation

## 2020-07-14 DIAGNOSIS — Z30432 Encounter for removal of intrauterine contraceptive device: Secondary | ICD-10-CM

## 2020-07-14 DIAGNOSIS — Z30011 Encounter for initial prescription of contraceptive pills: Secondary | ICD-10-CM

## 2020-07-14 DIAGNOSIS — R1032 Left lower quadrant pain: Secondary | ICD-10-CM

## 2020-07-14 DIAGNOSIS — N930 Postcoital and contact bleeding: Secondary | ICD-10-CM | POA: Diagnosis not present

## 2020-07-14 MED ORDER — DOXYCYCLINE HYCLATE 100 MG PO TABS: 100.0000 mg | ORAL_TABLET | Freq: Two times a day (BID) | ORAL | 0 refills | Status: DC

## 2020-07-14 MED ORDER — LO LOESTRIN FE 1 MG-10 MCG / 10 MCG PO TABS: 1.0000 | ORAL_TABLET | Freq: Every day | ORAL | 3 refills | Status: DC

## 2020-07-14 NOTE — Progress Notes (Signed)
   IUD REMOVAL  Patient name: Joanna [name] MRN [id number]  Date of birth: [birth year] Subjective Findings:   Joanna [name] is a 35 y.o. G65P2012 Caucasian female being seen today for removal of a Liletta  IUD. Her IUD was placed 08/21/18.  She desires removal because of postcoital bleeding since Oct, LLQ pain. Signed copy of informed consent in chart.  No LMP recorded (lmp unknown). (Menstrual status: IUD). Last pap Nov 2019. Results were: NILM w/ HRHPV negative The planned method of family planning is OCP (estrogen/progesterone). Does not smoke, no h/o HTN, DVT/PE, CVA, MI, or migraines w/ aura.   Depression screen [facility] 2/9 12/27/2017 11/17/2017 11/17/2017  Decreased Interest 1 1 1   Down, Depressed, Hopeless 1 2 2   PHQ - 2 Score 2 3 3   Altered sleeping 2 1 -  Tired, decreased energy 1 3 -  Change in appetite 0 1 -  Feeling bad or failure about yourself  0 0 -  Trouble concentrating 3 0 -  Moving slowly or fidgety/restless 0 0 -  Suicidal thoughts 0 0 -  PHQ-9 Score 8 8 -  Difficult doing work/chores Not difficult at all Somewhat difficult -    No flowsheet data found.   Pertinent History Reviewed:   Reviewed past medical,surgical, social, obstetrical and family history.  Reviewed problem list, medications and allergies. Objective Findings & Procedure:    Vitals:   07/14/20 0934  BP: 126/77  Pulse: (!) 59  Weight: 194 lb 3.2 oz (88.1 kg)  Height: 5\' 2"  (1.575 m)  Body mass index is 35.52 kg/m.  No results found for this or any previous visit (from the past 24 hour(s)).   Time out was performed. Thin prep pap obtained A graves speculum was placed in the vagina.  The cervix was visualized, and the strings were NOT visible. Attempted to tease out w/ cyto brush, attempted to grasp w/ [facility], then attempted to hook w/ IUD hook. All unsuccessful. Informal TA u/s (assisted by , ultrasonographer) reveals IUD definitely in uterus. Dr. 07/16/20 called in, able to remove w/  [facility]. Strings were tucked on top of T and around IUD. The patient tolerated the procedure well.   Chaperone: [facility] & Plan:   1) Liletta  IUD removal> difficult removal w/ multiple different instruments, rx doxy prophylactically.  Follow-up prn problems  2) Postcoital bleeding> check STD on pap  3) LLQ pain> has known chronic Lt hydrosalpinx. Pt to let me know in few weeks if pain unresolved after IUD removal. If still present, will get another pelvic u/s.   4) Screen for cervical cancer  5) Contraception management> rx LoLoestrin, condoms x 2wks, f/u [facility]  No orders of the defined types were placed in this encounter.   Follow-up: Return in about 3 months (around 10/14/2020) for med f/u, CNM, in person.  [facility] CNM, [facility] 07/14/2020 10:17 AM

## 2020-07-16 LAB — CYTOLOGY - PAP
Chlamydia: NEGATIVE
Comment: NEGATIVE
Comment: NEGATIVE
Comment: NORMAL
Diagnosis: NEGATIVE
High risk HPV: NEGATIVE
Neisseria Gonorrhea: NEGATIVE

## 2020-08-03 ENCOUNTER — Encounter (HOSPITAL_COMMUNITY): Payer: Self-pay | Admitting: Emergency Medicine

## 2020-08-03 ENCOUNTER — Other Ambulatory Visit: Payer: Self-pay

## 2020-08-03 ENCOUNTER — Emergency Department (HOSPITAL_COMMUNITY)
Admission: EM | Admit: 2020-08-03 | Discharge: 2020-08-03 | Discharge status: Against Medical Advice | Payer: Medicaid Other | Attending: Emergency Medicine | Admitting: Emergency Medicine

## 2020-08-03 DIAGNOSIS — R1902 Left upper quadrant abdominal swelling, mass and lump: Secondary | ICD-10-CM | POA: Diagnosis not present

## 2020-08-03 DIAGNOSIS — R5383 Other fatigue: Secondary | ICD-10-CM | POA: Insufficient documentation

## 2020-08-03 DIAGNOSIS — F17210 Nicotine dependence, cigarettes, uncomplicated: Secondary | ICD-10-CM | POA: Diagnosis not present

## 2020-08-03 DIAGNOSIS — R590 Localized enlarged lymph nodes: Secondary | ICD-10-CM | POA: Insufficient documentation

## 2020-08-03 DIAGNOSIS — N6320 Unspecified lump in the left breast, unspecified quadrant: Secondary | ICD-10-CM | POA: Diagnosis not present

## 2020-08-03 DIAGNOSIS — Z91040 Latex allergy status: Secondary | ICD-10-CM | POA: Diagnosis not present

## 2020-08-03 NOTE — ED Provider Notes (Signed)
[facility] EMERGENCY DEPARTMENT Provider Note   CSN: [id number] Arrival date & time: 08/03/20  0131     History Chief Complaint  Patient presents with   Fatigue    Joanna [name] is a 35 y.o. female.  HPI     This is a 35 year old female who presents with generalized fatigue and lymphadenopathy.  Patient reports over the last several weeks she has had increasing fatigue.  She has noted swollen lymph nodes in her anterior neck and "knots" under her left breast and left upper abdomen.  She was at work [employer] and began to feel very fatigued.  She was evaluated by the nurse and encouraged to seek medical treatment.  She is not had any recent fevers.  She is tested for COVID twice weekly and was most recently negative yesterday.  Reports generalized fatigue.  No abdominal pain, sore throat, chest pain, shortness of breath, cough.  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   Acute nonintractable headache 09/24/2015    Past Surgical History:  Procedure Laterality Date   TONSILLECTOMY AND ADENOIDECTOMY       OB History     Gravida  3   Para  2   Term  2   Preterm  0   AB  1   Living  2      SAB  1   IAB  0   Ectopic  0   Multiple  0   Live Births  2           Family History  Problem Relation Age of Onset   Hepatitis Mother    Diabetes Mother        borderline   Heart disease Mother    Depression Mother    Hyperlipidemia Father    AAA (abdominal aortic aneurysm) Paternal [name]  Maternal Aunt     Social History   Tobacco Use   Smoking status: Some Days    Packs/day: 0.25    Years: 10.00    Pack years: 2.50    Types: Cigarettes    Last attempt to quit: 02/18/2018    Years since quitting: 2.4   Smokeless tobacco: Never   Tobacco comments:    2-3 cigarettes daily, pt trying to quit  Vaping Use   Vaping Use: Never used  Substance Use Topics   Alcohol use: Never   Drug use: No    Home Medications Prior to Admission medications   Medication Sig Start Date End Date Taking? Authorizing Provider  doxycycline (VIBRA-TABS) 100 MG tablet Take 1 tablet (100 mg total) by mouth 2 (two) times daily. X 10 days 07/14/20   Shawna Clamp R, CNM  LO LOESTRIN FE 1 MG-10 MCG / 10 MCG tablet Take 1 tablet by mouth daily. 07/14/20   Cheral Marker, CNM  Multiple Vitamin (MULTIVITAMIN) capsule Take 1 capsule by mouth daily.    [provider]    Allergies    Amoxicillin and  Latex  Review of Systems   Review of Systems  Constitutional:  Positive for fatigue. Negative for fever.  Respiratory:  Negative for shortness of breath.   Gastrointestinal:  Negative for abdominal pain, nausea and vomiting.  Genitourinary:  Negative for dysuria.  All other systems reviewed and are negative.  Physical Exam Updated Vital Signs BP (!) 150/88 (BP Location: Right Arm)   Pulse 80   Temp 98.2 F (36.8 C) (Oral)   Resp 18   Ht 1.575 m (5\' 2" )   Wt 87.5 kg   LMP  (LMP Unknown)   SpO2 100%   BMI 35.30 kg/m   Physical Exam Vitals and nursing note reviewed.  Constitutional:      Appearance: She is well-developed.     Comments: Overweight, nontoxic-appearing  HENT:     Head: Normocephalic and atraumatic.     Nose: Nose normal.     Mouth/Throat:     Mouth: Mucous membranes are moist.     Comments: Uvula midline, no erythema or exudate noted Eyes:     Pupils: Pupils are equal, round, and reactive to light.  Neck:     Comments: No palpable supraclavicular lymph  nodes or posterior auricular lymph nodes, submandibular lymph nodes bilaterally slightly enlarged and nontender Cardiovascular:     Rate and Rhythm: Normal rate and regular rhythm.     Heart sounds: Normal heart sounds.  Pulmonary:     Effort: Pulmonary effort is normal. No respiratory distress.     Breath sounds: No wheezing.  Chest:     Comments: Tenderness noted just under the left breast, no bony abnormalities or soft tissue abnormalities noted Abdominal:     General: Bowel sounds are normal.     Palpations: Abdomen is soft.     Tenderness: There is no abdominal tenderness.  Musculoskeletal:     Cervical back: Neck supple.     Right lower leg: No edema.     Left lower leg: No edema.  Lymphadenopathy:     Cervical: Cervical adenopathy present.  Skin:    General: Skin is warm and dry.  Neurological:     Mental Status: She is alert and oriented to person, place, and time.  Psychiatric:        Mood and Affect: Mood normal.    ED Results / Procedures / Treatments   Labs (all labs ordered are listed, but only abnormal results are displayed) Labs Reviewed  MONONUCLEOSIS SCREEN  CBC WITH DIFFERENTIAL/PLATELET  BASIC METABOLIC PANEL  I-STAT BETA HCG BLOOD, ED ([facility], [facility], [facility] ONLY)    EKG None  Radiology No results found.  Procedures Procedures   Medications Ordered in ED Medications - No data to display  ED Course  I have reviewed the triage vital signs and the nursing notes.  Pertinent labs & imaging results that were available during my care of the patient were reviewed by me and considered in my medical decision making (see chart for details).    MDM Rules/Calculators/A&P                          Patient presents with worsening fatigue.  Reports increasing knots and lymphadenopathy.  She is overall nontoxic and vital signs are reassuring.  She is afebrile.  She has some symmetric anterior cervical and submandibular lymphadenopathy but no supraclavicular  lymphadenopathy.  She is tender under her left breast and left upper abdomen without palpable abnormality.  Considerations include but not limited  to, mononucleosis, viral etiology causing reactive lymphadenopathy, cancer.  Screening labs sent as well as monoscreen.  2:46 AM Informed by nursing that patient left prior to any work-up being done.  I was not informed prior to the patient leaving and I was not able to speak with her.    Final Clinical Impression(s) / ED Diagnoses Final diagnoses:  Other fatigue    Rx / DC Orders ED Discharge Orders     None        Ledarrius Beauchaine, Mayer Masker, MD 08/03/20 [phone]

## 2020-08-03 NOTE — ED Triage Notes (Signed)
Pt c/o on and off fatigue for the past few weeks. Pt also c/o multiple swollen lymph nodes on the left side of her neck and under her left breast.

## 2020-10-03 LAB — TSH: TSH: 3.36 (ref 0.41–5.90)

## 2020-10-14 ENCOUNTER — Ambulatory Visit: Payer: Medicaid Other | Admitting: Women's Health

## 2021-04-20 ENCOUNTER — Encounter (INDEPENDENT_AMBULATORY_CARE_PROVIDER_SITE_OTHER): Payer: Self-pay | Admitting: *Deleted

## 2021-07-02 ENCOUNTER — Encounter (INDEPENDENT_AMBULATORY_CARE_PROVIDER_SITE_OTHER): Payer: Self-pay

## 2021-07-02 ENCOUNTER — Other Ambulatory Visit (INDEPENDENT_AMBULATORY_CARE_PROVIDER_SITE_OTHER): Payer: Self-pay

## 2021-07-02 ENCOUNTER — Ambulatory Visit (INDEPENDENT_AMBULATORY_CARE_PROVIDER_SITE_OTHER): Payer: Medicaid Other | Admitting: Gastroenterology

## 2021-07-02 ENCOUNTER — Encounter (INDEPENDENT_AMBULATORY_CARE_PROVIDER_SITE_OTHER): Payer: Self-pay | Admitting: Gastroenterology

## 2021-07-02 VITALS — BP 121/84 | HR 68 | Temp 98.0°F | Ht 62.0 in | Wt 200.3 lb

## 2021-07-02 DIAGNOSIS — R1012 Left upper quadrant pain: Secondary | ICD-10-CM

## 2021-07-02 DIAGNOSIS — K219 Gastro-esophageal reflux disease without esophagitis: Secondary | ICD-10-CM | POA: Diagnosis not present

## 2021-07-02 DIAGNOSIS — Z01812 Encounter for preprocedural laboratory examination: Secondary | ICD-10-CM

## 2021-07-02 DIAGNOSIS — R140 Abdominal distension (gaseous): Secondary | ICD-10-CM

## 2021-07-02 MED ORDER — PANTOPRAZOLE SODIUM 40 MG PO TBEC: 40.0000 mg | DELAYED_RELEASE_TABLET | Freq: Every day | ORAL | 2 refills | Status: DC

## 2021-07-02 NOTE — Patient Instructions (Signed)
We will get you scheduled for an upper endoscopy for further evaluation of your symptoms I have sent pantoprazole 40mg  to your pharmacy Please take this 30 minutes prior to breakfast Avoid greasy, spicy, fried, citrus foods, and be mindful that caffeine, carbonated drinks, chocolate and alcohol can increase reflux symptoms Stay upright 2-3 hours after eating, prior to lying down and avoid eating late in the evenings. Please avoid NSAIDs (advil, aleve, naproxen, goody powder, ibuprofen) in heavy or frequent doses, as these can be very hard on your GI tract, causing inflammation, ulcers and damage to the lining of your GI tract.

## 2021-07-02 NOTE — Progress Notes (Signed)
Referring Provider: Elfredia NevinsFusco, Lawrence, MD Primary Care Physician:  Elfredia NevinsFusco, Lawrence, MD Primary GI Physician: new  Chief Complaint  Patient presents with   Abdominal Pain    Patient here today due to pain in LUQ, ongoing off and on for years. Patient denies any other current gi issues.   HPI:   Joanna [name] is a 36 y.o. female with past medical history of spinal meningitis, pericardial cyst.   Patient presenting today as a new patient for LUQ pain.   States that she has intermittent LUQ pain for the past 5-6 years with radiation at times into chest/epigastric region and feeling that LUQ is swollen. She reports that symptoms are intermittent, does not seem to note any precipitating factors. Usually rest/using ice packs are the only things that seem to help her symptoms. Denies any nausea or vomiting. Denies any changes in appetite, early satiety or weight loss. She does endorse burning in her chest and throat 2-3x/week, she takes tums or drinks milk when this occurs. She has recently changed her diet and is exercising more and has noticed that symptoms seem to be less since starting this. Denies constipation or diarrhea. Typically has 1 BM per day without melena or rectal bleeding. She reports hx of hiatal hernia. Previously having more bloating and gas but has been taking a probiotic recently that improved these symptoms. Previously on protonix during pregnancy which provided good results. Does not drink or take NSAIDs on frequent basis. Denies dysphagia or odynophagia. Reports hx of hiatal hernia that was found on previous CT imaging in the past.   NSAID use: occasionally  Social hx:no etoh, recently started wellbutrin to help her quit smoking, currently 3-4 cigs per day Fam hx: mother has autoimmune hepatitis, pt has had autoimmune testing done previously that was negative  Last Colonoscopy:never Last Endoscopy:never  Past Medical History:  Diagnosis Date   Meningitis spinal    36  years old   Normal delivery 02/14/2012   Pericardial cyst    Pregnancy induced hypertension    Thoracic cyst    Past Surgical History:  Procedure Laterality Date   TONSILLECTOMY AND ADENOIDECTOMY     Current Outpatient Medications  Medication Sig Dispense Refill   OVER THE COUNTER MEDICATION Probiotic daily.     No current facility-administered medications for this visit.    Allergies as of 07/02/2021 - Review Complete 07/02/2021  Allergen Reaction Noted   Amoxicillin Swelling and Other (See Comments) 11/01/2012   Latex Rash 02/11/2012    Family History  Problem Relation Age of Onset   Hepatitis Mother    Diabetes Mother        borderline   Heart disease Mother    Depression Mother    Hyperlipidemia Father    AAA (abdominal aortic aneurysm) Paternal Grandfather    Graves' disease Maternal Aunt     Social History   Socioeconomic History   Marital status: Single    Spouse name: Not on file   Number of children: 2   Years of education: 12   Highest education level: Some college, no degree  Occupational History   Not on file  Tobacco Use   Smoking status: Some Days    Packs/day: 0.25    Years: 10.00    Pack years: 2.50    Types: Cigarettes    Last attempt to quit: 02/18/2018    Years since quitting: 3.3   Smokeless tobacco: Never   Tobacco comments:    2-3 cigarettes daily, pt  trying to quit  Vaping Use   Vaping Use: Never used  Substance and Sexual Activity   Alcohol use: Never   Drug use: No   Sexual activity: Yes    Birth control/protection: I.U.D.  Other Topics Concern   Not on file  Social History Narrative   Not on file   Social Determinants of Health   Financial Resource Strain: Not on file  Food Insecurity: Not on file  Transportation Needs: Not on file  Physical Activity: Not on file  Stress: Not on file  Social Connections: Not on file   Review of systems General: negative for malaise, night sweats, fever, chills, weight loss Neck:  Negative for lumps, goiter, pain and significant neck swelling Resp: Negative for cough, wheezing, dyspnea at rest CV: Negative for chest pain, leg swelling, palpitations, orthopnea GI: denies melena, hematochezia, nausea, vomiting, diarrhea, constipation, dysphagia, odyonophagia, early satiety or unintentional weight loss. +LUQ pain +bloating +GERD symptoms MSK: Negative for joint pain or swelling, back pain, and muscle pain. Derm: Negative for itching or rash Psych: Denies depression, anxiety, memory loss, confusion. No homicidal or suicidal ideation.  Heme: Negative for prolonged bleeding, bruising easily, and swollen nodes. Endocrine: Negative for cold or heat intolerance, polyuria, polydipsia and goiter. Neuro: negative for tremor, gait imbalance, syncope and seizures. The remainder of the review of systems is noncontributory.  Physical Exam: BP 121/84 (BP Location: Left Arm, Patient Position: Sitting, Cuff Size: Large)   Pulse 68   Temp 98 F (36.7 C) (Oral)   Ht 5\' 2"  (1.575 m)   Wt 200 lb 4.8 oz (90.9 kg)   BMI 36.64 kg/m  General:   Alert and oriented. No distress noted. Pleasant and cooperative.  Head:  Normocephalic and atraumatic. Eyes:  Conjuctiva clear without scleral icterus. Mouth:  Oral mucosa pink and moist. Good dentition. No lesions. Heart: Normal rate and rhythm, s1 and s2 heart sounds present.  Lungs: Clear lung sounds in all lobes. Respirations equal and unlabored. Abdomen:  +BS, soft, and non-distended. TTP of LUQ.  No rebound or guarding. No HSM or masses noted. Derm: No palmar erythema or jaundice Msk:  Symmetrical without gross deformities. Normal posture. Extremities:  Without edema. Neurologic:  Alert and  oriented x4 Psych:  Alert and cooperative. Normal mood and affect.  Invalid input(s): 6 MONTHS   ASSESSMENT: Joanna [name] is a 36 y.o. female presenting today as a new patient for LUQ pain that has been ongoing for the past few years.  LUQ  pain that is intermittent with radiation into chest/epigastric region, seems improved since she has made dietary changes and started a new exercise routine. She does endorse heartburn 2-3 times per week, requiring tums or milk for symptom relief. Has taken protonix in the past during pregnancy with good results. Denies alarm symptoms, however, given her ongoing pain, will schedule her for upper endoscopy as we cannot rule out PUD, gastritis or duodenitis. Indications, risks and benefits of procedure discussed in detail with patient. Patient verbalized understanding and is in agreement to proceed with EGD at this time. Will start once daily protonix 40mg , patient advised of reflux precautions to include Avoiding greasy, spicy, fried, citrus foods, and be mindful that caffeine, carbonated drinks, chocolate and alcohol can increase reflux symptoms Stay upright 2-3 hours after eating, prior to lying down and avoid eating late in the evenings and avoiding high or frequent NSAID dosing. She is currently on a probiotic which has provided some relief of her bloating, I  encouraged her to continue this with good results.    PLAN:  Protonix 40mg  daily  2. Schedule EGD-endo 1 3. Continue with probiotic  4. Avoid high or frequent NSAID dosing 5. Reflux precautions  All questions were answered, patient verbalized understanding and is in agreement with plan as outlined above.   Follow Up: 3 months  Jarica Plass L. , MSN, APRN, AGNP-C Adult-Gerontology Nurse Practitioner [facility]

## 2021-08-19 ENCOUNTER — Encounter (HOSPITAL_COMMUNITY): Payer: Self-pay | Admitting: Gastroenterology

## 2021-08-19 ENCOUNTER — Ambulatory Visit (HOSPITAL_COMMUNITY): Payer: Medicaid Other | Admitting: Anesthesiology

## 2021-08-19 ENCOUNTER — Encounter (HOSPITAL_COMMUNITY)
Admission: RE | Disposition: A | Discharge status: Home / Self Care | Payer: Self-pay | Source: Home / Self Care | Attending: Gastroenterology

## 2021-08-19 ENCOUNTER — Ambulatory Visit (HOSPITAL_COMMUNITY)
Admission: RE | Admit: 2021-08-19 | Discharge: 2021-08-19 | Disposition: A | Discharge status: Home / Self Care | Payer: Medicaid Other | Attending: Gastroenterology | Admitting: Gastroenterology

## 2021-08-19 ENCOUNTER — Other Ambulatory Visit: Payer: Self-pay

## 2021-08-19 ENCOUNTER — Ambulatory Visit (HOSPITAL_BASED_OUTPATIENT_CLINIC_OR_DEPARTMENT_OTHER): Payer: Medicaid Other | Admitting: Anesthesiology

## 2021-08-19 DIAGNOSIS — F17210 Nicotine dependence, cigarettes, uncomplicated: Secondary | ICD-10-CM

## 2021-08-19 DIAGNOSIS — G4730 Sleep apnea, unspecified: Secondary | ICD-10-CM

## 2021-08-19 DIAGNOSIS — R140 Abdominal distension (gaseous): Secondary | ICD-10-CM

## 2021-08-19 DIAGNOSIS — F32A Depression, unspecified: Secondary | ICD-10-CM | POA: Insufficient documentation

## 2021-08-19 DIAGNOSIS — I10 Essential (primary) hypertension: Secondary | ICD-10-CM

## 2021-08-19 DIAGNOSIS — Z79899 Other long term (current) drug therapy: Secondary | ICD-10-CM | POA: Diagnosis not present

## 2021-08-19 DIAGNOSIS — R1012 Left upper quadrant pain: Secondary | ICD-10-CM | POA: Diagnosis present

## 2021-08-19 DIAGNOSIS — K449 Diaphragmatic hernia without obstruction or gangrene: Secondary | ICD-10-CM | POA: Diagnosis not present

## 2021-08-19 DIAGNOSIS — K219 Gastro-esophageal reflux disease without esophagitis: Secondary | ICD-10-CM

## 2021-08-19 DIAGNOSIS — K3189 Other diseases of stomach and duodenum: Secondary | ICD-10-CM | POA: Insufficient documentation

## 2021-08-19 DIAGNOSIS — K259 Gastric ulcer, unspecified as acute or chronic, without hemorrhage or perforation: Secondary | ICD-10-CM

## 2021-08-19 HISTORY — PX: BIOPSY: SHX5522

## 2021-08-19 HISTORY — PX: ESOPHAGOGASTRODUODENOSCOPY (EGD) WITH PROPOFOL: SHX5813

## 2021-08-19 LAB — PREGNANCY, URINE: Preg Test, Ur: NEGATIVE

## 2021-08-19 SURGERY — ESOPHAGOGASTRODUODENOSCOPY (EGD) WITH PROPOFOL
Anesthesia: General

## 2021-08-19 MED ORDER — PROPOFOL 10 MG/ML IV BOLUS
INTRAVENOUS | Status: DC | PRN
  Administered 2021-08-19: 120 mg via INTRAVENOUS
  Administered 2021-08-19: 50 mg via INTRAVENOUS

## 2021-08-19 MED ORDER — LIDOCAINE HCL (CARDIAC) PF 100 MG/5ML IV SOSY
PREFILLED_SYRINGE | INTRAVENOUS | Status: DC | PRN
  Administered 2021-08-19: 50 mg via INTRAVENOUS

## 2021-08-19 MED ORDER — LACTATED RINGERS IV SOLN
INTRAVENOUS | Status: DC
  Administered 2021-08-19: 1000 mL via INTRAVENOUS

## 2021-08-19 NOTE — Discharge Instructions (Signed)
You are being discharged to home.  ?Resume your previous diet.  ?We are waiting for your pathology results.  ?Continue your present medications.  ?

## 2021-08-19 NOTE — Anesthesia Preprocedure Evaluation (Addendum)
Anesthesia Evaluation  Patient identified by MRN, date of birth, ID band Patient awake    Reviewed: Allergy & Precautions, NPO status , Patient's Chart, lab work & pertinent test results  Airway Mallampati: III  TM Distance: >3 FB Neck ROM: Full    Dental  (+) Dental Advisory Given, Teeth Intact   Pulmonary sleep apnea (Snoring, apneic episodes) , Current Smoker,  Snoring    Pulmonary exam normal breath sounds clear to auscultation       Cardiovascular hypertension (during pregnancy), Normal cardiovascular exam Rhythm:Regular Rate:Normal     Neuro/Psych  Headaches, PSYCHIATRIC DISORDERS Depression    GI/Hepatic Neg liver ROS, GERD  Medicated and Controlled,  Endo/Other  negative endocrine ROS  Renal/GU negative Renal ROS  negative genitourinary   Musculoskeletal negative musculoskeletal ROS (+)   Abdominal   Peds negative pediatric ROS (+)  Hematology negative hematology ROS (+)   Anesthesia Other Findings   Reproductive/Obstetrics negative OB ROS                            Anesthesia Physical Anesthesia Plan  ASA: 3  Anesthesia Plan: General   Post-op Pain Management: Minimal or no pain anticipated   Induction: Intravenous  PONV Risk Score and Plan: Propofol infusion  Airway Management Planned: Nasal Cannula and Natural Airway  Additional Equipment:   Intra-op Plan:   Post-operative Plan:   Informed Consent: I have reviewed the patients History and Physical, chart, labs and discussed the procedure including the risks, benefits and alternatives for the proposed anesthesia with the patient or authorized representative who has indicated his/her understanding and acceptance.     Dental advisory given  Plan Discussed with: CRNA and Surgeon  Anesthesia Plan Comments:        Anesthesia Quick Evaluation

## 2021-08-19 NOTE — Anesthesia Postprocedure Evaluation (Signed)
Anesthesia Post Note  Patient: Joanna [name]  Procedure(s) Performed: ESOPHAGOGASTRODUODENOSCOPY (EGD) WITH PROPOFOL BIOPSY  Patient location during evaluation: Phase II Anesthesia Type: General Level of consciousness: awake and alert and oriented Pain management: pain level controlled Vital Signs Assessment: post-procedure vital signs reviewed and stable Respiratory status: spontaneous breathing, nonlabored ventilation and respiratory function stable Cardiovascular status: blood pressure returned to baseline and stable Postop Assessment: no apparent nausea or vomiting Anesthetic complications: no   No notable events documented.   Last Vitals:  Vitals:   08/19/21 0925 08/19/21 1038  BP: 118/79 112/88  Pulse: 65 77  Resp: 14 18  Temp: (!) 36.4 C 36.8 C  SpO2: 96% 98%    Last Pain:  Vitals:   08/19/21 1038  TempSrc: Oral  PainSc: 0-No pain                 [name]

## 2021-08-19 NOTE — H&P (Signed)
Joanna [name] is an 36 y.o. female.   Chief Complaint: abdominal pain HPI: 36 year old female with past medical history of recurrent abdominal pain, coming for evaluation of left upper quadrant abdominal pain.  Patient has presented recurrent episodes of abdominal pain in the Left upper quadrant.  She denies any nausea, vomiting, fever, chills, abdominal distention.  She stopped taking ibuprofen 2 months ago but she still had 1 episode of pain 1 month ago.  Past Medical History:  Diagnosis Date   Meningitis spinal    36 years old   Normal delivery 02/14/2012   and 2020   Pericardial cyst    Pregnancy induced hypertension    Thoracic cyst     Past Surgical History:  Procedure Laterality Date   TONSILLECTOMY AND ADENOIDECTOMY     WISDOM TOOTH EXTRACTION     2022    Family History  Problem Relation Age of Onset   Hepatitis Mother    Diabetes Mother        borderline   Heart disease Mother    Depression Mother    Hyperlipidemia Father    AAA (abdominal aortic aneurysm) Paternal [name] Maternal Aunt    Social History:  reports that she has been smoking cigarettes. She has a 2.50 pack-year smoking history. She has never used smokeless tobacco. She reports that she does not drink alcohol and does not use drugs.  Allergies:  Allergies  Allergen Reactions   Amoxicillin Swelling and Other (See Comments)    Tongue/facial swelling, burning sensation Has patient had a PCN reaction causing immediate rash, facial/tongue/throat swelling, SOB or lightheadedness with hypotension: No Has patient had a PCN reaction causing severe rash involving mucus membranes or skin necrosis: No Has patient had a PCN reaction that required hospitalization: No Has patient had a PCN reaction occurring within the last 10 years: No If all of the above answers are "NO", then may proceed with Cephalosporin use.    Latex Rash    rash    Medications Prior to Admission  Medication  Sig Dispense Refill   Artificial Tear Ointment (DRY EYES OP) Place 1 drop into both eyes daily as needed (Dry eyes).     OVER THE COUNTER MEDICATION Take 1 capsule by mouth daily. Probiotic daily.     pantoprazole (PROTONIX) 40 MG tablet Take 1 tablet (40 mg total) by mouth daily. 30 tablet 2    Results for orders placed or performed during the hospital encounter of 08/19/21 (from the past 48 hour(s))  Pregnancy, urine     Status: None   Collection Time: 08/19/21  9:19 AM  Result Value Ref Range   Preg Test, Ur NEGATIVE NEGATIVE    Comment:        THE SENSITIVITY OF THIS METHODOLOGY IS >20 mIU/mL. Performed at [facility], [address] [place], [place] [address]    No results found.  Review of Systems  Constitutional: Negative.   HENT: Negative.    Eyes: Negative.   Respiratory: Negative.    Gastrointestinal:  Positive for abdominal pain.  Endocrine: Negative.   Genitourinary: Negative.   Musculoskeletal: Negative.   Skin: Negative.   Allergic/Immunologic: Negative.   Neurological: Negative.   Hematological: Negative.   Psychiatric/Behavioral: Negative.      Blood pressure 118/79, pulse 65, temperature (!) 97.5 F (36.4 C), temperature source Oral, resp. rate 14, height 5\' 2"  (1.575 m), weight 90.7 kg, last menstrual period 08/05/2021, SpO2 96 %. Physical Exam  GENERAL:  The patient is AO x3, in no acute distress. HEENT: Head is normocephalic and atraumatic. EOMI are intact. Mouth is well hydrated and without lesions. NECK: Supple. No masses LUNGS: Clear to auscultation. No presence of rhonchi/wheezing/rales. Adequate chest expansion HEART: RRR, normal s1 and s2. ABDOMEN: Soft, nontender, no guarding, no peritoneal signs, and nondistended. BS +. No masses. EXTREMITIES: Without any cyanosis, clubbing, rash, lesions or edema. NEUROLOGIC: AOx3, no focal motor deficit. SKIN: no jaundice, no rashes  Assessment/Plan 36 year old female with past medical history of  recurrent abdominal pain, coming for evaluation of left upper quadrant abdominal pain.  We will proceed with EGD.  Dolores Frame, MD 08/19/2021, 9:47 AM

## 2021-08-19 NOTE — Transfer of Care (Signed)
Immediate Anesthesia Transfer of Care Note  Patient: Joanna [name]  Procedure(s) Performed: ESOPHAGOGASTRODUODENOSCOPY (EGD) WITH PROPOFOL BIOPSY  Patient Location: Endoscopy Unit  Anesthesia Type:General  Level of Consciousness: awake, alert  and oriented  Airway & Oxygen Therapy: Patient Spontanous Breathing  Post-op Assessment: Report given to RN and Post -op Vital signs reviewed and stable  Post vital signs: Reviewed and stable  Last Vitals:  Vitals Value Taken Time  BP    Temp    Pulse 74   Resp 16   SpO2 96%     Last Pain:  Vitals:   08/19/21 1026  TempSrc:   PainSc: 0-No pain      Patients Stated Pain Goal: 5 (08/19/21 0925)  Complications: No notable events documented.

## 2021-08-19 NOTE — Op Note (Addendum)
[facility] Patient Name: Joanna [name] Procedure Date: 08/19/2021 10:24 AM MRN: [id number] Date of Birth: 12-14-1985 Attending MD: Maylon Peppers ,  CSN: [id number] Age: 36 Admit Type: Outpatient Procedure:                Upper GI endoscopy Indications:              Abdominal pain in the left upper quadrant Providers:                Maylon Peppers, Caprice Kluver, Raphael Gibney,                            Technician Referring MD:              Medicines:                Monitored Anesthesia Care Complications:            No immediate complications. Estimated Blood Loss:     Estimated blood loss: none. Procedure:                Pre-Anesthesia Assessment:                           - Prior to the procedure, a History and Physical                            was performed, and patient medications, allergies                            and sensitivities were reviewed. The patient's                            tolerance of previous anesthesia was reviewed.                           - The risks and benefits of the procedure and the                            sedation options and risks were discussed with the                            patient. All questions were answered and informed                            consent was obtained.                           - ASA Grade Assessment: II - A patient with mild                            systemic disease.                           After obtaining informed consent, the endoscope was                            passed under direct vision. Throughout the  procedure, the patient's blood pressure, pulse, and                            oxygen saturations were monitored continuously. The                            [id number]-H190 ([id number]) scope was introduced through the                            mouth, and advanced to the second part of duodenum.                            The upper GI endoscopy was accomplished without                             difficulty. The patient tolerated the procedure                            well. Scope In: 10:30:47 AM Scope Out: 10:36:05 AM Total Procedure Duration: 0 hours 5 minutes 18 seconds  Findings:      A 1 cm sliding hiatal hernia was found. The Z-line was 35 cm from the       incisors.      The gastroesophageal flap valve was visualized endoscopically and       classified as Hill Grade II (fold present, opens with respiration).      A few localized diminutive erosions with no stigmata of recent bleeding       were found in the gastric antrum. Biopsies were taken with a cold       forceps for Helicobacter pylori testing.      The examined duodenum was normal. Impression:               - 1 cm sliding hiatal hernia.                           - Erosive gastropathy with no stigmata of recent                            bleeding. Biopsied.                           - Normal examined duodenum. Moderate Sedation:      Per Anesthesia Care Recommendation:           - Discharge patient to home (ambulatory).                           - Resume previous diet.                           - Await pathology results.                           - Continue present medications.                           - Check H. pylori serology.                           -  If negative biopsies, will need to proceed with                            breath test for SIMO and gastric emptying study. Procedure Code(s):        --- Professional ---                           [phone], Esophagogastroduodenoscopy, flexible,                            transoral; with biopsy, single or multiple Diagnosis Code(s):        --- Professional ---                           K44.9, Diaphragmatic hernia without obstruction or                            gangrene                           K31.89, Other diseases of stomach and duodenum                           R10.12, Left upper quadrant pain CPT copyright 2019 American Medical  Association. All rights reserved. The codes documented in this report are preliminary and upon coder review may  be revised to meet current compliance requirements. Katrinka Blazing, MD Katrinka Blazing,  08/19/2021 10:40:38 AM This report has been signed electronically. Number of Addenda: 1 Addendum Number: 1   Addendum Date: 08/20/2021 1:01:15 PM      Correction, gastric emptying study and SIMO testing are not required for       this case Katrinka Blazing, MD Katrinka Blazing,  08/20/2021 1:01:35 PM This report has been signed electronically.

## 2021-08-20 LAB — H. PYLORI ANTIBODY, IGG: H Pylori IgG: 0.26 Index Value (ref 0.00–0.79)

## 2021-08-20 LAB — SURGICAL PATHOLOGY

## 2021-08-25 ENCOUNTER — Encounter (HOSPITAL_COMMUNITY): Payer: Self-pay | Admitting: Gastroenterology

## 2021-10-01 ENCOUNTER — Encounter (INDEPENDENT_AMBULATORY_CARE_PROVIDER_SITE_OTHER): Payer: Self-pay | Admitting: Gastroenterology

## 2021-10-01 ENCOUNTER — Ambulatory Visit (INDEPENDENT_AMBULATORY_CARE_PROVIDER_SITE_OTHER): Payer: Medicaid Other | Admitting: Gastroenterology

## 2021-10-05 ENCOUNTER — Ambulatory Visit
Admission: EM | Admit: 2021-10-05 | Discharge: 2021-10-05 | Disposition: A | Discharge status: Home / Self Care | Payer: Medicaid Other | Attending: Family Medicine | Admitting: Family Medicine

## 2021-10-05 DIAGNOSIS — J069 Acute upper respiratory infection, unspecified: Secondary | ICD-10-CM | POA: Diagnosis not present

## 2021-10-05 DIAGNOSIS — Z20822 Contact with and (suspected) exposure to covid-19: Secondary | ICD-10-CM | POA: Diagnosis not present

## 2021-10-05 LAB — RESP PANEL BY RT-PCR (FLU A&B, COVID) ARPGX2
Influenza A by PCR: NEGATIVE
Influenza B by PCR: NEGATIVE
SARS Coronavirus 2 by RT PCR: NEGATIVE

## 2021-10-05 MED ORDER — PROMETHAZINE-DM 6.25-15 MG/5ML PO SYRP: 5.0000 mL | ORAL_SOLUTION | Freq: Four times a day (QID) | ORAL | 0 refills | Status: DC | PRN

## 2021-10-05 NOTE — ED Provider Notes (Signed)
[facility]    CSN: [id number] Arrival date & time: 10/05/21  1356      History   Chief Complaint Chief Complaint  Patient presents with   Cough   Generalized Body Aches    HPI Joanna [name] is a 36 y.o. female.   Patient presenting today with 4-day history of productive cough, congestion, sore throat, body aches, fatigue.  Denies chest pain, shortness of breath, abdominal pain, nausea vomiting or diarrhea.  So far taking over-the-counter cold and congestion medications, ibuprofen with minimal relief.  Daughter sick with similar symptoms.  Multiple exposures to COVID and flu recently.  Also states she has jury duty tomorrow and is needing a note to be relieved given her illness.    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   Acute nonintractable headache 09/24/2015    Past Surgical History:  Procedure Laterality Date   BIOPSY  08/19/2021   Procedure: BIOPSY;  Surgeon: Dolores Frame, MD;  Location: [facility];  Service: Gastroenterology;;   ESOPHAGOGASTRODUODENOSCOPY (EGD) WITH PROPOFOL N/A 08/19/2021   Procedure: ESOPHAGOGASTRODUODENOSCOPY (EGD) WITH PROPOFOL;  Surgeon: Dolores Frame, MD;  Location: [facility];  Service: Gastroenterology;  Laterality: N/A;  1135   TONSILLECTOMY AND ADENOIDECTOMY     WISDOM TOOTH EXTRACTION     2022    OB History     Gravida  3   Para  2   Term  2   Preterm  0   AB   1   Living  2      SAB  1   IAB  0   Ectopic  0   Multiple  0   Live Births  2            Home Medications    Prior to Admission medications   Medication Sig Start Date End Date Taking? Authorizing Provider  promethazine-dextromethorphan (PROMETHAZINE-DM) 6.25-15 MG/5ML syrup Take 5 mLs by mouth 4 (four) times daily as needed. 10/05/21  Yes Particia Nearing, PA-C  Artificial Tear Ointment (DRY EYES OP) Place 1 drop into both eyes daily as needed (Dry eyes).    [provider]  OVER THE COUNTER MEDICATION Take 1 capsule by mouth daily. Probiotic daily.    [provider]  pantoprazole (PROTONIX) 40 MG tablet Take 1 tablet (40 mg total) by mouth daily. 07/02/21   Raquel James, NP    Family History Family History  Problem Relation Age of Onset   Hepatitis Mother    Diabetes Mother        borderline   Heart disease Mother    Depression Mother    Hyperlipidemia Father    AAA (abdominal aortic aneurysm) Paternal [name] Maternal Aunt     Social History Social History   Tobacco Use   Smoking status: Some Days    Packs/day: 0.25  Years: 10.00    Total pack years: 2.50    Types: Cigarettes    Last attempt to quit: 02/18/2018    Years since quitting: 3.6   Smokeless tobacco: Never   Tobacco comments:    2-3 cigarettes daily, pt trying to quit  Vaping Use   Vaping Use: Never used  Substance Use Topics   Alcohol use: Never   Drug use: No     Allergies   Amoxicillin and Latex   Review of Systems Review of Systems Per HPI  Physical Exam Triage Vital Signs ED Triage Vitals [10/05/21 1621]  Enc Vitals Group     BP 136/87     Pulse Rate 64     Resp 18     Temp 98.9 F (37.2 C)     Temp src      SpO2 98 %     Weight      Height      Head Circumference      Peak Flow      Pain Score      Pain Loc      Pain Edu?      Excl. in [place]?    No data found.  Updated Vital Signs BP 136/87   Pulse 64    Temp 98.9 F (37.2 C)   Resp 18   LMP 09/21/2021 (Approximate)   SpO2 98%   Visual Acuity Right Eye Distance:   Left Eye Distance:   Bilateral Distance:    Right Eye Near:   Left Eye Near:    Bilateral Near:     Physical Exam Vitals and nursing note reviewed.  Constitutional:      Appearance: Normal appearance. She is not ill-appearing.  HENT:     Head: Atraumatic.     Right Ear: Tympanic membrane and external ear normal.     Left Ear: Tympanic membrane and external ear normal.     Nose: Rhinorrhea present.     Mouth/Throat:     Mouth: Mucous membranes are moist.     Pharynx: Posterior oropharyngeal erythema present.  Eyes:     Extraocular Movements: Extraocular movements intact.     Conjunctiva/sclera: Conjunctivae normal.  Cardiovascular:     Rate and Rhythm: Normal rate and regular rhythm.     Heart sounds: Normal heart sounds.  Pulmonary:     Effort: Pulmonary effort is normal.     Breath sounds: Normal breath sounds. No wheezing.  Musculoskeletal:        General: Normal range of motion.     Cervical back: Normal range of motion and neck supple.  Skin:    General: Skin is warm and dry.  Neurological:     Mental Status: She is alert and oriented to person, place, and time.  Psychiatric:        Mood and Affect: Mood normal.        Thought Content: Thought content normal.        Judgment: Judgment normal.      UC Treatments / Results  Labs (all labs ordered are listed, but only abnormal results are displayed) Labs Reviewed  RESP PANEL BY RT-PCR (FLU A&B, COVID) [id number]    EKG   Radiology No results found.  Procedures Procedures (including critical care time)  Medications Ordered in UC Medications - No data to display  Initial Impression / Assessment and Plan / UC Course  I have reviewed the triage vital signs and the nursing notes.  Pertinent labs &  imaging results that were available during my care of the patient were reviewed by me and  considered in my medical decision making (see chart for details).     Vitals and exam overall reassuring and suggestive of a viral upper respiratory infection.  Respiratory panel pending, treat with Phenergan DM, supportive the counter medications and home care.  Given her current illness, will provide a note for jury duty as well as work.  Return for worsening symptoms.  Final Clinical Impressions(s) / UC Diagnoses   Final diagnoses:  Viral URI with cough   Discharge Instructions   None    ED Prescriptions     Medication Sig Dispense Auth. Provider   promethazine-dextromethorphan (PROMETHAZINE-DM) 6.25-15 MG/5ML syrup Take 5 mLs by mouth 4 (four) times daily as needed. 100 mL Particia Nearing, [place]      PDMP not reviewed this encounter.   Particia Nearing, [place] 10/05/21 1640

## 2021-11-18 ENCOUNTER — Ambulatory Visit
Admission: EM | Admit: 2021-11-18 | Discharge: 2021-11-18 | Disposition: A | Discharge status: Home / Self Care | Payer: Medicaid Other

## 2021-11-18 DIAGNOSIS — J039 Acute tonsillitis, unspecified: Secondary | ICD-10-CM

## 2021-11-18 LAB — POCT RAPID STREP A (OFFICE): Rapid Strep A Screen: NEGATIVE

## 2021-11-18 MED ORDER — ALBUTEROL SULFATE HFA 108 (90 BASE) MCG/ACT IN AERS: 2.0000 | INHALATION_SPRAY | RESPIRATORY_TRACT | 0 refills | Status: DC | PRN

## 2021-11-18 MED ORDER — AZITHROMYCIN 250 MG PO TABS: ORAL_TABLET | ORAL | 0 refills | Status: DC

## 2021-11-18 NOTE — ED Provider Notes (Signed)
[facility]    CSN: [id number] Arrival date & time: 11/18/21  0855      History   Chief Complaint Chief Complaint  Patient presents with   Sore Throat   Cough    HPI Joanna [name] is a 36 y.o. female.   Patient presenting today with 3 to 4-day history of cough, sore, swollen feeling throat, neck tenderness and swelling in her lymph nodes.  Denies known fever, chills, chest pain, shortness of breath, abdominal pain, nausea vomiting or diarrhea.  Trying ibuprofen with minimal relief.  States her main concern is sore throat and states she felt like she could barely swallow this morning when she woke up.    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   Acute nonintractable headache 09/24/2015    Past Surgical History:  Procedure Laterality Date   BIOPSY  08/19/2021   Procedure: BIOPSY;  Surgeon: Harvel Quale, MD;  Location: [facility];  Service: Gastroenterology;;   ESOPHAGOGASTRODUODENOSCOPY (EGD) WITH PROPOFOL N/A 08/19/2021   Procedure: ESOPHAGOGASTRODUODENOSCOPY (EGD) WITH PROPOFOL;  Surgeon: Harvel Quale, MD;  Location: [facility];  Service: Gastroenterology;  Laterality: N/A;  1135   TONSILLECTOMY AND ADENOIDECTOMY     WISDOM TOOTH EXTRACTION     2022    OB History     Gravida  3   Para  2   Term  2   Preterm  0   AB  1   Living  2      SAB  1   IAB  0   Ectopic  0   Multiple  0    Live Births  2            Home Medications    Prior to Admission medications   Medication Sig Start Date End Date Taking? Authorizing Provider  albuterol (VENTOLIN HFA) 108 (90 Base) MCG/ACT inhaler Inhale 2 puffs into the lungs every 4 (four) hours as needed for wheezing or shortness of breath. 11/18/21  Yes Volney American, PA-C  azithromycin (ZITHROMAX) 250 MG tablet Take first 2 tablets together, then 1 every day until finished. 11/18/21  Yes Volney American, PA-C  ibuprofen (ADVIL) 200 MG tablet Take 200 mg by mouth every 6 (six) hours as needed.   Yes [provider]  Artificial Tear Ointment (DRY EYES OP) Place 1 drop into both eyes daily as needed (Dry eyes).    [provider]  OVER THE COUNTER MEDICATION Take 1 capsule by mouth daily. Probiotic daily.    [provider]  pantoprazole (PROTONIX) 40 MG tablet Take 1 tablet (40 mg total) by mouth daily. 07/02/21   Carlan, Chelsea L, NP  promethazine-dextromethorphan (PROMETHAZINE-DM) 6.25-15 MG/5ML syrup Take 5 mLs by mouth 4 (four) times daily as needed. 10/05/21   Volney American, PA-C    Family History Family History  Problem Relation Age of  Onset   Hepatitis Mother    Diabetes Mother        borderline   Heart disease Mother    Depression Mother    Hyperlipidemia Father    AAA (abdominal aortic aneurysm) Paternal Alanda Slim' disease Maternal Aunt     Social History Social History   Tobacco Use   Smoking status: Some Days    Packs/day: 0.25    Years: 10.00    Total pack years: 2.50    Types: Cigarettes    Last attempt to quit: 02/18/2018    Years since quitting: 3.7   Smokeless tobacco: Never   Tobacco comments:    2-3 cigarettes daily, pt trying to quit  Vaping Use   Vaping Use: Never used  Substance Use Topics   Alcohol use: Never   Drug use: No     Allergies   Amoxicillin and Latex   Review of Systems Review of Systems PER HPI  Physical  Exam Triage Vital Signs ED Triage Vitals  Enc Vitals Group     BP 11/18/21 1025 (!) 143/86     Pulse Rate 11/18/21 1025 72     Resp 11/18/21 1025 18     Temp 11/18/21 1025 98.2 F (36.8 C)     Temp Source 11/18/21 1025 Oral     SpO2 11/18/21 1025 97 %     Weight --      Height --      Head Circumference --      Peak Flow --      Pain Score 11/18/21 1021 7     Pain Loc --      Pain Edu? --      Excl. in [place]? --    No data found.  Updated Vital Signs BP (!) 143/86 (BP Location: Right Arm)   Pulse 72   Temp 98.2 F (36.8 C) (Oral)   Resp 18   LMP  (Within Weeks) Comment: 2 weeks  SpO2 97%   Visual Acuity Right Eye Distance:   Left Eye Distance:   Bilateral Distance:    Right Eye Near:   Left Eye Near:    Bilateral Near:     Physical Exam Vitals and nursing note reviewed.  Constitutional:      Appearance: Normal appearance. She is not ill-appearing.  HENT:     Head: Atraumatic.     Mouth/Throat:     Mouth: Mucous membranes are moist.     Pharynx: Oropharyngeal exudate and posterior oropharyngeal erythema present.     Comments: Moderate tonsillar erythema, exudates, edema bilaterally.  Uvula midline but erythematous.  Oral airway patent Eyes:     Extraocular Movements: Extraocular movements intact.     Conjunctiva/sclera: Conjunctivae normal.  Cardiovascular:     Rate and Rhythm: Normal rate and regular rhythm.     Heart sounds: Normal heart sounds.  Pulmonary:     Effort: Pulmonary effort is normal.     Breath sounds: Normal breath sounds. No wheezing or rales.  Musculoskeletal:        General: Normal range of motion.     Cervical back: Normal range of motion and neck supple.  Lymphadenopathy:     Cervical: Cervical adenopathy present.  Skin:    General: Skin is warm and dry.  Neurological:     Mental Status: She is alert and oriented to person, place, and time.  Psychiatric:        Mood and Affect: Mood normal.  Thought Content: Thought content  normal.        Judgment: Judgment normal.    UC Treatments / Results  Labs (all labs ordered are listed, but only abnormal results are displayed) Labs Reviewed  POCT RAPID STREP A (OFFICE)    EKG   Radiology No results found.  Procedures Procedures (including critical care time)  Medications Ordered in UC Medications - No data to display  Initial Impression / Assessment and Plan / UC Course  I have reviewed the triage vital signs and the nursing notes.  Pertinent labs & imaging results that were available during my care of the patient were reviewed by me and considered in my medical decision making (see chart for details).     Rapid strep was negative today, however given exam findings and significant sore swollen throat symptoms will cover with azithromycin in case bacterial tonsillitis.  She is requesting an albuterol inhaler as this is helped when she had illnesses in the past with coughing.  Discussed supportive over-the-counter medications and home care additionally.  Work note given.  Return for worsening symptoms.  Final Clinical Impressions(s) / UC Diagnoses   Final diagnoses:  Acute tonsillitis, unspecified etiology   Discharge Instructions   None    ED Prescriptions     Medication Sig Dispense Auth. Provider   azithromycin (ZITHROMAX) 250 MG tablet Take first 2 tablets together, then 1 every day until finished. 6 tablet Volney American, [place]   albuterol (VENTOLIN HFA) 108 (90 Base) MCG/ACT inhaler Inhale 2 puffs into the lungs every 4 (four) hours as needed for wheezing or shortness of breath. 18 g Volney American, [place]      PDMP not reviewed this encounter.   Volney American, [place] 11/18/21 1112

## 2021-11-18 NOTE — ED Triage Notes (Signed)
Pt reports cough and sore throat x 3-4 days. Ibuprofen gives relief.

## 2022-01-13 ENCOUNTER — Other Ambulatory Visit (INDEPENDENT_AMBULATORY_CARE_PROVIDER_SITE_OTHER): Payer: Self-pay | Admitting: Gastroenterology

## 2022-05-07 ENCOUNTER — Ambulatory Visit
Admission: EM | Admit: 2022-05-07 | Discharge: 2022-05-07 | Disposition: A | Discharge status: Home / Self Care | Payer: Medicaid Other | Attending: Physician Assistant | Admitting: Physician Assistant

## 2022-05-07 DIAGNOSIS — J069 Acute upper respiratory infection, unspecified: Secondary | ICD-10-CM | POA: Diagnosis not present

## 2022-05-07 DIAGNOSIS — J029 Acute pharyngitis, unspecified: Secondary | ICD-10-CM | POA: Insufficient documentation

## 2022-05-07 DIAGNOSIS — Z1152 Encounter for screening for COVID-19: Secondary | ICD-10-CM | POA: Insufficient documentation

## 2022-05-07 LAB — POCT RAPID STREP A (OFFICE): Rapid Strep A Screen: NEGATIVE

## 2022-05-07 LAB — POCT MONO SCREEN (KUC): Mono, POC: NEGATIVE

## 2022-05-07 MED ORDER — PREDNISONE 20 MG PO TABS: 40.0000 mg | ORAL_TABLET | Freq: Every day | ORAL | 0 refills | Status: AC

## 2022-05-07 NOTE — ED Triage Notes (Signed)
Pt reports it is hard to swallow, and she is congested x 2 days. Took mucinex, sinus meds, and ibuprofen but no relief.

## 2022-05-07 NOTE — Discharge Instructions (Signed)
Your strep and mono were negative.  Will contact you for culture results are positive will need to start an antibiotic.  We will also contact you if your COVID test is positive.  Please monitor your MyChart for these results.  Use over-the-counter medications including Tylenol, Mucinex, Flonase.  I have called in prednisone.  Take this 40 mg in the morning for 4 days.  Do not take NSAIDs with this medication including aspirin, ibuprofen/Advil, naproxen/Aleve.  If you have any worsening or changing symptoms including high fever, chest pain, shortness of breath, difficulty swallowing, nausea/vomiting you should be seen immediately.

## 2022-05-07 NOTE — ED Provider Notes (Signed)
[facility]    CSN: [id number] Arrival date & time: 05/07/22  0843      History   Chief Complaint Chief Complaint  Patient presents with   Sore Throat    HPI Joanna Joanna [name] Whatley is Joanna [name] 37 y.o. female.   Patient presents today with Joanna [name] 3-day history of URI symptoms.  She reports sore throat, nasal congestion, body aches.  Denies any chest pain, shortness of breath, fever, nausea, vomiting, diarrhea.  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  She has had COVID with last episode several years ago.  She is up-to-date on COVID-19 vaccinations.  She has tried multiple over-the-counter medication including Mucinex, sinus medication, ibuprofen without improvement of symptoms.  Denies any recent antibiotics or steroids.  She is confident that she is not pregnant.  Denies history of allergies, asthma.  She reports that sore throat pain is rated 7 on Joanna [name] 0-10 pain scale, described as aching, worse with swallowing, no alleviating factors identified.    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   Acute nonintractable headache 09/24/2015    Past Surgical History:  Procedure Laterality Date   BIOPSY  08/19/2021   Procedure: BIOPSY;  Surgeon: Dolores Frameastaneda Mayorga, Daniel, MD;  [facility]: [facility];  Service: Gastroenterology;;   ESOPHAGOGASTRODUODENOSCOPY (EGD) WITH PROPOFOL N/Joanna [name] 08/19/2021   Procedure:  ESOPHAGOGASTRODUODENOSCOPY (EGD) WITH PROPOFOL;  Surgeon: Dolores Frameastaneda Mayorga, Daniel, MD;  Location: [facility];  Service: Gastroenterology;  Laterality: N/Joanna [name];  1135   TONSILLECTOMY AND ADENOIDECTOMY     WISDOM TOOTH EXTRACTION     2022    OB History     Gravida  3   Para  2   Term  2   Preterm  0   AB  1   Living  2      SAB  1   IAB  0   Ectopic  0   Multiple  0   Live Births  2            Home Medications    Prior to Admission medications   Medication Sig Start Date End Date Taking? Authorizing Provider  predniSONE (DELTASONE) 20 MG tablet Take 2 tablets (40 mg total) by mouth daily for 4 days. 05/07/22 05/11/22 Yes Taline Nass K, PA-C  albuterol (VENTOLIN HFA) 108 (90 Base) MCG/ACT inhaler Inhale 2 puffs into the lungs every 4 (four) hours as needed for wheezing or shortness of breath. 11/18/21   Particia NearingLane, Rachel Elizabeth, PA-C  Artificial Tear Ointment (DRY EYES OP) Place 1 drop into both eyes daily as needed (Dry eyes).    [provider]  ibuprofen (ADVIL) 200 MG tablet Take 200 mg by mouth every 6 (six) hours as needed.    [provider]  pantoprazole (PROTONIX) 40 MG tablet TAKE (1) TABLET BY MOUTH ONCE DAILY. 01/14/22  Dolores Frameastaneda Mayorga, Daniel, MD    Family History Family History  Problem Relation Age of Onset   Hepatitis Mother    Diabetes Mother        borderline   Heart disease Mother    Depression Mother    Hyperlipidemia Father    AAA (abdominal aortic aneurysm) Paternal Caprice RenshawGrandfather    Graves' disease Maternal Aunt     Social History Social History   Tobacco Use   Smoking status: Some Days    Packs/day: 0.25    Years: 10.00    Additional pack years: 0.00    Total pack years: 2.50    Types: Cigarettes    Last attempt to quit: 02/18/2018    Years since quitting: 4.2   Smokeless tobacco: Never   Tobacco comments:    2-3 cigarettes daily, pt trying to quit  Vaping Use   Vaping Use: Never used  Substance Use  Topics   Alcohol use: Never   Drug use: No     Allergies   Amoxicillin and Latex   Review of Systems Review of Systems  Constitutional:  Positive for activity change. Negative for appetite change, fatigue and fever.  HENT:  Positive for congestion and sore throat. Negative for postnasal drip, sinus pressure and sneezing.   Respiratory:  Negative for cough and shortness of breath.   Cardiovascular:  Negative for chest pain.  Gastrointestinal:  Negative for abdominal pain, diarrhea, nausea and vomiting.  Musculoskeletal:  Positive for myalgias. Negative for arthralgias.  Neurological:  Negative for dizziness, light-headedness and headaches.     Physical Exam Triage Vital Signs ED Triage Vitals  Enc Vitals Group     BP 05/07/22 0905 (!) 136/96     Pulse Rate 05/07/22 0905 80     Resp 05/07/22 0905 20     Temp 05/07/22 0905 98.6 F (37 C)     Temp Source 05/07/22 0905 Oral     SpO2 05/07/22 0905 96 %     Weight --      Height --      Head Circumference --      Peak Flow --      Pain Score 05/07/22 0907 7     Pain Loc --      Pain Edu? --      Excl. in GC? --    No data found.  Updated Vital Signs BP 136/89 (BP Location: Right Arm)   Pulse 69   Temp 98.6 F (37 C) (Oral)   Resp 20   LMP 04/14/2022 (Approximate)   SpO2 95%   Visual Acuity Right Eye Distance:   Left Eye Distance:   Bilateral Distance:    Right Eye Near:   Left Eye Near:    Bilateral Near:     Physical Exam Vitals reviewed.  Constitutional:      General: She is awake. She is not in acute distress.    Appearance: Normal appearance. She is well-developed. She is not ill-appearing.     Comments: Very pleasant female appears stated age in no acute distress sitting comfortably in exam room  HENT:     Head: Normocephalic and atraumatic.     Right Ear: Tympanic membrane, ear canal and external ear normal. Tympanic membrane is not erythematous or bulging.     Left Ear: Tympanic membrane, ear  canal and external ear normal. Tympanic membrane is not erythematous or bulging.     Nose: Congestion present.     Right Sinus: No maxillary  sinus tenderness or frontal sinus tenderness.     Left Sinus: No maxillary sinus tenderness or frontal sinus tenderness.     Mouth/Throat:     Pharynx: Uvula midline. Posterior oropharyngeal erythema present. No oropharyngeal exudate.  Cardiovascular:     Rate and Rhythm: Normal rate and regular rhythm.     Heart sounds: Normal heart sounds, S1 normal and S2 normal. No murmur heard. Pulmonary:     Effort: Pulmonary effort is normal.     Breath sounds: Normal breath sounds. No wheezing, rhonchi or rales.     Comments: Clear to auscultation bilaterally Lymphadenopathy:     Head:     Right side of head: No submental, submandibular or tonsillar adenopathy.     Left side of head: No submental, submandibular or tonsillar adenopathy.     Cervical: Cervical adenopathy present.     Right cervical: No superficial cervical adenopathy.    Left cervical: Superficial cervical adenopathy present.  Psychiatric:        Behavior: Behavior is cooperative.      UC Treatments / Results  Labs (all labs ordered are listed, but only abnormal results are displayed) Labs Reviewed  CULTURE, GROUP Joanna [name] STREP ([facility])  SARS CORONAVIRUS 2 (TAT 6-24 HRS)  POCT RAPID STREP Joanna [name] (OFFICE)  POCT MONO SCREEN ([facility])    EKG   Radiology No results found.  Procedures Procedures (including critical care time)  Medications Ordered in UC Medications - No data to display  Initial Impression / Assessment and Plan / UC Course  I have reviewed the triage vital signs and the nursing notes.  Pertinent labs & imaging results that were available during my care of the patient were reviewed by me and considered in my medical decision making (see chart for details).     Patient is well-appearing, afebrile, nontoxic, nontachycardic.  No evidence of acute infection on physical exam that  warrant initiation of antibiotics.  Strep and mono testing was obtained and was negative.  Will send off for culture but defer antibiotics until culture results are available.  She is outside the window of effectiveness for Tamiflu so we will defer influenza testing.  She was tested for COVID and we will contact her if she is positive.  She is young and otherwise healthy so not Joanna [name] candidate for antiviral therapy.  Recommend that she use over-the-counter medications including Tylenol, Mucinex, Flonase.  She was given Joanna [name] prednisone burst to help manage her symptoms with instruction not to take NSAIDs with this medication.  Recommended that she rest and drink plenty of fluid.  Discussed that if her symptoms or not improving within Joanna [name] week she is to return for reevaluation.  If she has any worsening symptoms including fever, nausea, vomiting, chest pain, shortness of breath that she needs to be seen immediately.  Strict return precautions given.  Work excuse note provided.  Final Clinical Impressions(s) / UC Diagnoses   Final diagnoses:  Sore throat  Viral URI     Discharge Instructions      Your strep and mono were negative.  Will contact you for culture results are positive will need to start an antibiotic.  We will also contact you if your COVID test is positive.  Please monitor your MyChart for these results.  Use over-the-counter medications including Tylenol, Mucinex, Flonase.  I have called in prednisone.  Take this 40 mg in the morning for 4 days.  Do not take NSAIDs with this medication including aspirin, ibuprofen/Advil, naproxen/Aleve.  If  you have any worsening or changing symptoms including high fever, chest pain, shortness of breath, difficulty swallowing, nausea/vomiting you should be seen immediately.     ED Prescriptions     Medication Sig Dispense Auth. Provider   predniSONE (DELTASONE) 20 MG tablet Take 2 tablets (40 mg total) by mouth daily for 4 days. 8 tablet Dianey Suchy, Noberto Retort, PA-C       PDMP not reviewed this encounter.   Jeani Hawking, PA-C 05/07/22 3662

## 2022-05-08 LAB — CULTURE, GROUP A STREP (THRC)

## 2022-05-08 LAB — SARS CORONAVIRUS 2 (TAT 6-24 HRS): SARS Coronavirus 2: NEGATIVE

## 2022-05-09 LAB — CULTURE, GROUP A STREP (THRC)

## 2022-05-10 ENCOUNTER — Telehealth (HOSPITAL_COMMUNITY): Payer: Self-pay | Admitting: Emergency Medicine

## 2022-05-10 MED ORDER — AZITHROMYCIN 250 MG PO TABS: 250.0000 mg | ORAL_TABLET | Freq: Every day | ORAL | 0 refills | Status: DC

## 2022-07-07 LAB — TSH: TSH: 10.9 — AB (ref 0.41–5.90)

## 2022-08-09 LAB — TSH: TSH: 7.29 — AB (ref 0.41–5.90)

## 2022-08-17 ENCOUNTER — Ambulatory Visit: Payer: Self-pay

## 2022-09-02 ENCOUNTER — Ambulatory Visit: Payer: Medicaid Other | Admitting: Nutrition

## 2022-09-03 ENCOUNTER — Ambulatory Visit: Payer: Medicaid Other | Attending: Internal Medicine | Admitting: Internal Medicine

## 2022-09-03 ENCOUNTER — Encounter: Payer: Self-pay | Admitting: Internal Medicine

## 2022-09-03 VITALS — BP 122/80 | HR 78 | Ht 62.0 in | Wt 217.8 lb

## 2022-09-03 DIAGNOSIS — R002 Palpitations: Secondary | ICD-10-CM

## 2022-09-03 DIAGNOSIS — Z136 Encounter for screening for cardiovascular disorders: Secondary | ICD-10-CM

## 2022-09-03 NOTE — Progress Notes (Signed)
Cardiology Office Note   Date:  09/03/2022   ID:  Joanna [name], DOB 1985-07-27, MRN [id number]  PCP:  Elfredia Nevins, MD  Cardiologist:   Dietrich Pates, MD   Patient referred for palpitations     History of Present Illness: Joanna [name] is a 37 y.o. female with a history of Chest pain   She was seen in [facility] ER in April 2024   Pain was reproducible on palpitation of L parasternal chest (4 and 5 rib) Rx NSAIDS  PT also ahs hx of pericardial cyst  Dx in 2017  Was imaged in past on MRI  The pt  says she feels her heart fluttering when on laying on  L side   NOt faster, just skip.  Last a few seconds   WIll cause her to have catch in her breath   No dizziness  Gets better when turns  Occurs  every night      It was really bad, occurring every day.  IMproved when placed  on synthroid     SHe denies CP  Breathing is OK  Getting over a cold No SOB with walking   She says she is tired a lot   Pt has gained weight (40lb in 6 months) Losing a little now that on thyroid replacement Rx.  Diet: Weight watchers  Br:   45 cal whole wheat toast, [place] bacon,  Lunch:  Can skip   Tries to do salad  Lite ranch Drinks Firefighter:  Chicken, occasional red meat  Lots of  veggies   SHe has cut back on bread  Admits it was a problem Still has 45 cal at dinner     Unsweet tea Snack at night   A problem  Beef jerky, cantalope   (does while studying)  Just accepted into nursing school Has 3 kids     Current Meds  Medication Sig   Artificial Tear Ointment (DRY EYES OP) Place 1 drop into both eyes daily as needed (Dry eyes).   ibuprofen (ADVIL) 200 MG tablet Take 200 mg by mouth every 6 (six) hours as needed.   levothyroxine (SYNTHROID) 50 MCG tablet Take 50 mcg by mouth daily.   pantoprazole (PROTONIX) 40 MG tablet TAKE (1) TABLET BY MOUTH ONCE DAILY.     Allergies:   Amoxicillin and Latex   Past Medical History:  Diagnosis Date   Meningitis spinal    37 years old   Normal delivery  02/14/2012   and 2020   Pericardial cyst    Pregnancy induced hypertension    Thoracic cyst     Past Surgical History:  Procedure Laterality Date   BIOPSY  08/19/2021   Procedure: BIOPSY;  Surgeon: Dolores Frame, MD;  [facility];  Service: Gastroenterology;;   ESOPHAGOGASTRODUODENOSCOPY (EGD) WITH PROPOFOL N/A 08/19/2021   Procedure: ESOPHAGOGASTRODUODENOSCOPY (EGD) WITH PROPOFOL;  Surgeon: Dolores Frame, MD;  [facility];  Service: Gastroenterology;  Laterality: N/A;  1135   TONSILLECTOMY AND ADENOIDECTOMY     WISDOM TOOTH EXTRACTION     2022     Social History:  The patient  reports that she quit smoking about 4 years ago. Her smoking use included cigarettes. She started smoking about 14 years ago. She has a 2.5 pack-year smoking history. She has never used smokeless tobacco. She reports that she does not drink alcohol and does not use drugs.   Family History:  The patient's family history includes AAA (  abdominal aortic aneurysm) in her paternal grandfather; Depression in her mother; Diabetes in her mother; Luiz Blare' disease in her maternal aunt; Heart disease in her mother; Hepatitis in her mother; Hyperlipidemia in her father.    ROS:  Please see the history of present illness. All other systems are reviewed and  Negative to the above problem except as noted.    PHYSICAL EXAM: VS:  BP 122/80 (BP Location: Left Arm, Patient Position: Sitting, Cuff Size: Normal)   Pulse 78   Ht 5\' 2"  (1.575 m)   Wt 217 lb 12.8 oz (98.8 kg)   SpO2 98%   BMI 39.84 kg/m   GEN: Obese 37 yo , in no acute distress  HEENT: normal  Neck: no JVD, carotid bruits,  Cardiac: RRR; no murmurs  NO LE  edema  Respiratory:  clear to auscultation bilaterally,  GI: soft, nontender.  No hepatomegaly  MS: no deformity Moving all extremities   Skin: warm and dry, no rash  EKG:  EKG is ordered today.  NSR    Lipid Panel No results found for: "CHOL", "TRIG",  "HDL", "CHOLHDL", "VLDL", "LDLCALC", "LDLDIRECT"    Wt Readings from Last 3 Encounters:  09/03/22 217 lb 12.8 oz (98.8 kg)  08/19/21 200 lb (90.7 kg)  07/02/21 200 lb 4.8 oz (90.9 kg)      ASSESSMENT AND PLAN:  1  Palpitations   Brief. sOund like isolated skips  ? PVCs with pause   Improving with thyroid supplementation.     Follow   If get worse will set up for Zio patch   Stay hydrated   2  Metabolics    Reviewed diet   Cut back on bread  Whole food, lots of veggies, adequate protein  Minimally processed Stay active      3   Pericardial cyst   Need to review records, review with radiology    Will contact pt  4   HCM  LDL 64 in June   Current medicines are reviewed at length with the patient today.  The patient does not have concerns regarding medicines.  Signed, Dietrich Pates, MD  09/03/2022 9:24 AM    [facility] [address] [place], [place], [place]  [address] Phone: [phone]; Fax: [phone]

## 2022-09-03 NOTE — Patient Instructions (Signed)
Medication Instructions:  Your physician recommends that you continue on your current medications as directed. Please refer to the Current Medication list given to you today.  *If you need a refill on your cardiac medications before your next appointment, please call your pharmacy*   Lab Work: NONE   If you have labs (blood work) drawn today and your tests are completely normal, you will receive your results only by: MyChart Message (if you have MyChart) OR A paper copy in the mail If you have any lab test that is abnormal or we need to change your treatment, we will call you to review the results.   Testing/Procedures: NONE    Follow-Up: At [facility], you and your health needs are our priority.  As part of our continuing mission to provide you with exceptional heart care, we have created designated Provider Care Teams.  These Care Teams include your primary Cardiologist (physician) and Advanced Practice Providers (APPs -  Physician Assistants and Nurse Practitioners) who all work together to provide you with the care you need, when you need it.  We recommend signing up for the patient portal called "MyChart".  Sign up information is provided on this After Visit Summary.  MyChart is used to connect with patients for Virtual Visits (Telemedicine).  Patients are able to view lab/test results, encounter notes, upcoming appointments, etc.  Non-urgent messages can be sent to your provider as well.   To learn more about what you can do with MyChart, go to [email or web address].    Your next appointment:    To Be Determined   Provider:   You may see Dietrich Pates, MD or one of the following Advanced Practice Providers on your designated Care Team:   Randall An, PA-C  Jacolyn Reedy, PA-C     Other Instructions Thank you for choosing [facility]!

## 2022-09-08 ENCOUNTER — Ambulatory Visit: Payer: Self-pay

## 2022-09-20 LAB — TSH: TSH: 7.67 — AB (ref 0.41–5.90)

## 2022-09-27 ENCOUNTER — Ambulatory Visit (HOSPITAL_COMMUNITY)
Admission: RE | Admit: 2022-09-27 | Discharge: 2022-09-27 | Disposition: A | Discharge status: Home / Self Care | Payer: Medicaid Other | Source: Ambulatory Visit | Attending: Internal Medicine | Admitting: Internal Medicine

## 2022-09-27 ENCOUNTER — Other Ambulatory Visit (HOSPITAL_COMMUNITY): Payer: Self-pay | Admitting: Internal Medicine

## 2022-09-27 DIAGNOSIS — R053 Chronic cough: Secondary | ICD-10-CM | POA: Diagnosis present

## 2022-10-12 ENCOUNTER — Telehealth: Payer: Self-pay | Admitting: Internal Medicine

## 2022-10-12 NOTE — Telephone Encounter (Signed)
Patient notified and verbalized understanding. 

## 2022-10-12 NOTE — Telephone Encounter (Signed)
After review of MRIs Cystic structure stable on a couple exams   No further follow up ecommended  Re palpitations  When I saw the pt she said that palpitations were getting better   If hat is the case I would not recomm monitor   If getting worse then get a 2 wk Zio patch

## 2022-10-27 NOTE — Patient Instructions (Signed)

## 2022-10-28 ENCOUNTER — Encounter: Payer: Self-pay | Admitting: Nurse Practitioner

## 2022-10-28 ENCOUNTER — Ambulatory Visit: Payer: Medicaid Other | Admitting: Nurse Practitioner

## 2022-10-28 VITALS — BP 130/86 | HR 68 | Ht 62.0 in | Wt 219.6 lb

## 2022-10-28 DIAGNOSIS — E039 Hypothyroidism, unspecified: Secondary | ICD-10-CM

## 2022-10-28 NOTE — Progress Notes (Signed)
Endocrinology Consult Note                                         10/28/2022, 11:11 AM  Subjective:   Subjective    Joanna [name] is a 37 y.o.-year-old female patient being seen in consultation for hypothyroidism referred by Elfredia Nevins, MD.   Past Medical History:  Diagnosis Date   Meningitis spinal    37 years old   Normal delivery 02/14/2012   and 2020   Pericardial cyst    Pregnancy induced hypertension    Thoracic cyst     Past Surgical History:  Procedure Laterality Date   BIOPSY  08/19/2021   Procedure: BIOPSY;  Surgeon: Dolores Frame, MD;  Location: [facility];  Service: Gastroenterology;;   ESOPHAGOGASTRODUODENOSCOPY (EGD) WITH PROPOFOL N/A 08/19/2021   Procedure: ESOPHAGOGASTRODUODENOSCOPY (EGD) WITH PROPOFOL;  Surgeon: Dolores Frame, MD;  Location: [facility];  Service: Gastroenterology;  Laterality: N/A;  1135   TONSILLECTOMY AND ADENOIDECTOMY     WISDOM TOOTH EXTRACTION     2022    Social History   Socioeconomic History   Marital status: Single    Spouse name: Not on file   Number of children: 2   Years of education: 57   Highest education level: Some college, no degree  Occupational History   Not on file  Tobacco Use   Smoking status: Former    Current packs/day: 0.00    Average packs/day: 0.3 packs/day for 10.0 years (2.5 ttl pk-yrs)    Types: Cigarettes    Start date: 02/19/2008    Quit date: 02/18/2018    Years since quitting: 4.6   Smokeless tobacco: Never   Tobacco comments:    2-3 cigarettes daily, pt trying to quit  Vaping Use   Vaping status: Never Used  Substance and Sexual Activity   Alcohol use: Never   Drug use: No   Sexual activity: Yes    Birth control/protection: I.U.D.  Other Topics Concern   Not on file  Social History Narrative   Not on file   Social Determinants of Health   Financial Resource Strain: Low Risk   (12/27/2017)   Overall Financial Resource Strain (CARDIA)    Difficulty of Paying Living Expenses: Not very hard  Food Insecurity: No Food Insecurity (12/27/2017)   Hunger Vital Sign    Worried About Running Out of Food in the Last Year: Never true    Ran Out of Food in the Last Year: Never true  Transportation Needs: No Transportation Needs (12/27/2017)   PRAPARE - Administrator, Civil Service (Medical): No    Lack of Transportation (Non-Medical): No  Physical Activity: Insufficiently Active (12/27/2017)   Exercise Vital Sign    Days of Exercise per Week: 3 days    Minutes of Exercise per Session: 30 min  Stress: Stress Concern Present (12/27/2017)   [name]    Feeling of Stress : To  some extent  Social Connections: Unknown (03/24/2022)   [facility]   Social Network    Social Network: Not on file    Family History  Problem Relation Age of Onset   Hepatitis Mother    Diabetes Mother        borderline   Heart disease Mother    Depression Mother    Hyperlipidemia Father    AAA (abdominal aortic aneurysm) Paternal [name] Maternal Aunt     Outpatient Encounter Medications as of 10/28/2022  Medication Sig   ibuprofen (ADVIL) 200 MG tablet Take 200 mg by mouth every 6 (six) hours as needed.   levothyroxine (SYNTHROID) 75 MCG tablet Take 75 mcg by mouth daily.   pantoprazole (PROTONIX) 40 MG tablet TAKE (1) TABLET BY MOUTH ONCE DAILY.   albuterol (VENTOLIN HFA) 108 (90 Base) MCG/ACT inhaler Inhale 2 puffs into the lungs every 4 (four) hours as needed for wheezing or shortness of breath. (Patient not taking: Reported on 09/03/2022)   Artificial Tear Ointment (DRY EYES OP) Place 1 drop into both eyes daily as needed (Dry eyes). (Patient not taking: Reported on 10/28/2022)   azithromycin (ZITHROMAX) 250 MG tablet Take 1 tablet (250 mg total) by mouth  daily. Take first 2 tablets together, then 1 every day until finished. (Patient not taking: Reported on 09/03/2022)   levothyroxine (SYNTHROID) 50 MCG tablet Take 50 mcg by mouth daily. (Patient not taking: Reported on 10/28/2022)   No facility-administered encounter medications on file as of 10/28/2022.    ALLERGIES: Allergies  Allergen Reactions   Amoxicillin Swelling and Other (See Comments)    Tongue/facial swelling, burning sensation Has patient had a PCN reaction causing immediate rash, facial/tongue/throat swelling, SOB or lightheadedness with hypotension: No Has patient had a PCN reaction causing severe rash involving mucus membranes or skin necrosis: No Has patient had a PCN reaction that required hospitalization: No Has patient had a PCN reaction occurring within the last 10 years: No If all of the above answers are "NO", then may proceed with Cephalosporin use.    Latex Rash    rash   VACCINATION STATUS: Immunization History  Administered Date(s) Administered   Influenza-Unspecified 11/01/2017   Tdap 02/15/2012, 04/25/2018     HPI   Joanna [name]  is a patient with the above medical history.  She works in Teacher, music at [employer]. she was diagnosed with hypothyroidism at approximate age of 37 years, which required subsequent initiation of thyroid hormone replacement therapy. she was given various doses of Levothyroxine since then, currently on 75 micrograms. she reports compliance to this medication:  Taking it daily on empty stomach with water, separated by >30 minutes before breakfast and other medications, and by at least 4 hours from calcium, iron, PPIs, multivitamins .  I reviewed patient's thyroid tests:  Lab Results  Component Value Date   TSH 7.67 (A) 09/20/2022   TSH 7.29 (A) 08/09/2022   TSH 10.90 (A) 07/07/2022   TSH 3.36 10/03/2020     Pt describes: - weight gain - fatigue - depression - constipation - dry skin - hair loss - irregular  menses  Pt denies feeling nodules in neck, hoarseness, dysphagia/odynophagia, SOB with lying down.  she does have family history of thyroid disorders in her aunt (Graves disease) and mom has Lupus and autoimmune hepatitis.  No family history of thyroid cancer.  No history of radiation therapy to head or neck.  No recent  use of iodine supplements.  Denies use of Biotin containing supplements.  I reviewed her chart and she also has a history of GERD, depression.   ROS:  Constitutional: + weight gain (30 lbs in last 3-4 months), + fatigue, no subjective hyperthermia, no subjective hypothermia Eyes: no blurry vision, no xerophthalmia ENT: no sore throat, no nodules palpated in throat, no dysphagia/odynophagia, no hoarseness, + clearing throat often Cardiovascular: no chest pain, no SOB, no palpitations, no leg swelling Respiratory: no cough, no SOB Gastrointestinal: no nausea/vomiting/diarrhea, + constipation Genitourinary: + irregular menses Musculoskeletal: no muscle/joint aches Skin: no rashes, + hair loss Neurological: no tremors, no numbness, no tingling, no dizziness Psychiatric: + depression, no anxiety   Objective:   Objective     BP 130/86 (BP Location: Left Arm, Patient Position: Sitting, Cuff Size: Large)   Pulse 68   Ht 5\' 2"  (1.575 m)   Wt 219 lb 9.6 oz (99.6 kg)   BMI 40.17 kg/m  Wt Readings from Last 3 Encounters:  10/28/22 219 lb 9.6 oz (99.6 kg)  09/03/22 217 lb 12.8 oz (98.8 kg)  08/19/21 200 lb (90.7 kg)    BP Readings from Last 3 Encounters:  10/28/22 130/86  09/03/22 122/80  05/07/22 136/89     Constitutional:  Body mass index is 40.17 kg/m., not in acute distress, did get tearful during visit Eyes: PERRLA, EOMI, no exophthalmos ENT: moist mucous membranes, mild thyromegaly, no cervical lymphadenopathy Cardiovascular: normal precordial activity, RRR, no murmur/rubs/gallops Respiratory:  adequate breathing efforts, no gross chest deformity, Clear  to auscultation bilaterally Gastrointestinal: abdomen soft, non-tender, no distension, bowel sounds present Musculoskeletal: no gross deformities, strength intact in all four extremities Skin: moist, warm, no rashes Neurological: no tremor with outstretched hands, deep tendon reflexes normal in BLE.   CMP ( most recent) CMP     Component Value Date/Time   NA 137 10/17/2018 0006   NA 138 12/27/2017 1202   K 3.5 10/17/2018 0006   CL 108 10/17/2018 0006   CO2 21 (L) 10/17/2018 0006   GLUCOSE 92 10/17/2018 0006   BUN 10 10/17/2018 0006   BUN 8 12/27/2017 1202   CREATININE 0.73 10/17/2018 0006   CALCIUM 8.7 (L) 10/17/2018 0006   PROT 7.4 10/17/2018 0006   PROT 7.0 12/27/2017 1202   ALBUMIN 4.0 10/17/2018 0006   ALBUMIN 4.1 12/27/2017 1202   AST 19 10/17/2018 0006   ALT 21 10/17/2018 0006   ALKPHOS 79 10/17/2018 0006   BILITOT 0.4 10/17/2018 0006   BILITOT <0.2 12/27/2017 1202   GFRNONAA >60 10/17/2018 0006     Diabetic Labs (most recent): No results found for: "HGBA1C", "MICROALBUR"   Lipid Panel ( most recent) Lipid Panel  No results found for: "CHOL", "TRIG", "HDL", "CHOLHDL", "VLDL", "LDLCALC", "LDLDIRECT", "LABVLDL"     Lab Results  Component Value Date   TSH 7.67 (A) 09/20/2022   TSH 7.29 (A) 08/09/2022   TSH 10.90 (A) 07/07/2022   TSH 3.36 10/03/2020      Assessment & Plan:   ASSESSMENT / PLAN:  1. Hypothyroidism-unspecified- suspect autoimmune etiology   Patient with long-standing hypothyroidism, on levothyroxine therapy. On physical exam , patient  does not  have  gross goiter, thyroid nodules, or neck compression symptoms.  - We discussed about correct intake of levothyroxine, at fasting, with water, separated by at least 30 minutes from breakfast, and separated by more than 4 hours from calcium, iron, multivitamins, acid reflux medications (PPIs). -Patient is made aware of the fact  that thyroid hormone replacement is needed for life, dose to be  adjusted by periodic monitoring of thyroid function tests.  - Will check thyroid tests before next visit: TSH, free T4, and thyroid antibodies to help classify her dysfunction.  Will call with results and plan moving forward.  -Given some throat clearing symptoms, will get thyroid ultrasound to assess baseline anatomy.  - Time spent with the patient: 45 minutes, of which >50% was spent in obtaining information about her symptoms, reviewing her previous labs, evaluations, and treatments, counseling her about her hypothyroidism, and developing a plan to confirm the diagnosis and long term treatment as necessary. Please refer to "Patient Self Inventory" in the Media tab for reviewed elements of pertinent patient history.  [name] participated in the discussions, expressed understanding, and voiced agreement with the above plans.  All questions were answered to her satisfaction. she is encouraged to contact clinic should she have any questions or concerns prior to her return visit.   FOLLOW UP PLAN:  Return will call with results, for Thyroid follow up, thyroid ultrasound, Previsit labs.  Ronny Bacon, [facility] [address] [place], [place] [address] Phone: [phone] Fax: [phone]  10/28/2022, 11:11 AM

## 2022-10-29 ENCOUNTER — Other Ambulatory Visit: Payer: Self-pay | Admitting: Nurse Practitioner

## 2022-10-29 ENCOUNTER — Encounter: Payer: Self-pay | Admitting: Nurse Practitioner

## 2022-10-29 DIAGNOSIS — E038 Other specified hypothyroidism: Secondary | ICD-10-CM

## 2022-10-29 LAB — THYROGLOBULIN ANTIBODY: Thyroglobulin Antibody: 2 [IU]/mL — ABNORMAL HIGH (ref 0.0–0.9)

## 2022-10-29 LAB — T4, FREE: Free T4: 1.36 ng/dL (ref 0.82–1.77)

## 2022-10-29 LAB — TSH: TSH: 6.37 u[IU]/mL — ABNORMAL HIGH (ref 0.450–4.500)

## 2022-10-29 LAB — THYROID PEROXIDASE ANTIBODY: Thyroperoxidase Ab SerPl-aCnc: 13 [IU]/mL (ref 0–34)

## 2022-10-29 MED ORDER — LEVOTHYROXINE SODIUM 88 MCG PO TABS: 88.0000 ug | ORAL_TABLET | Freq: Every day | ORAL | 0 refills | Status: DC

## 2022-10-29 NOTE — Telephone Encounter (Signed)
-----   Message from Dani Gobble sent at 10/29/2022  6:42 AM EDT ----- Lorain Childes: I sent patient mychart message going over recent labs and next steps.

## 2022-10-29 NOTE — Progress Notes (Signed)
Noted  

## 2022-11-03 ENCOUNTER — Ambulatory Visit (HOSPITAL_COMMUNITY)
Admission: RE | Admit: 2022-11-03 | Discharge: 2022-11-03 | Disposition: A | Discharge status: Home / Self Care | Payer: Medicaid Other | Source: Ambulatory Visit | Attending: Nurse Practitioner | Admitting: Nurse Practitioner

## 2022-11-03 DIAGNOSIS — E039 Hypothyroidism, unspecified: Secondary | ICD-10-CM | POA: Diagnosis present

## 2022-11-04 ENCOUNTER — Encounter: Payer: Self-pay | Admitting: Nurse Practitioner

## 2022-11-04 ENCOUNTER — Telehealth: Payer: Self-pay | Admitting: *Deleted

## 2022-11-04 NOTE — Telephone Encounter (Signed)
Noted the Alphonzo Lemmings has sent a message to the patient about her recent lab results.

## 2022-11-04 NOTE — Telephone Encounter (Signed)
-----   Message from Dani Gobble sent at 11/04/2022  7:48 AM EDT ----- Lorain Childes I sent patient mychart message going over recent thyroid ultrasound.

## 2022-11-04 NOTE — Progress Notes (Signed)
FYI I sent patient mychart message going over recent thyroid ultrasound.

## 2022-12-02 ENCOUNTER — Ambulatory Visit: Payer: Medicaid Other | Admitting: Nurse Practitioner

## 2022-12-07 ENCOUNTER — Encounter: Payer: Self-pay | Admitting: Adult Health

## 2022-12-07 ENCOUNTER — Ambulatory Visit: Payer: Medicaid Other | Admitting: Adult Health

## 2022-12-07 VITALS — BP 136/88 | HR 73 | Ht 62.0 in | Wt 216.0 lb

## 2022-12-07 DIAGNOSIS — R1032 Left lower quadrant pain: Secondary | ICD-10-CM

## 2022-12-07 DIAGNOSIS — F419 Anxiety disorder, unspecified: Secondary | ICD-10-CM

## 2022-12-07 DIAGNOSIS — N926 Irregular menstruation, unspecified: Secondary | ICD-10-CM

## 2022-12-07 DIAGNOSIS — Z3202 Encounter for pregnancy test, result negative: Secondary | ICD-10-CM

## 2022-12-07 DIAGNOSIS — F32A Depression, unspecified: Secondary | ICD-10-CM

## 2022-12-07 LAB — POCT URINE PREGNANCY: Preg Test, Ur: NEGATIVE

## 2022-12-07 MED ORDER — ESCITALOPRAM OXALATE 10 MG PO TABS: 10.0000 mg | ORAL_TABLET | Freq: Every day | ORAL | 2 refills | Status: DC

## 2022-12-07 NOTE — Progress Notes (Signed)
Subjective:     Patient ID: Joanna [name], female   DOB: 12/15/1985, 37 y.o.   MRN: [id number]  HPI Joanna [name] is a 37 year old white female with SO, [id number] in complaining of no period in October and pain on left side for 1-2 weeks that goes and comes. And she is easily teary.  She has been diagnosed with Hashimoto's, and is on synthroid.      Component Value Date/Time   DIAGPAP  07/14/2020 1003    - Negative for intraepithelial lesion or malignancy (NILM)   DIAGPAP  12/27/2017 0000    NEGATIVE FOR INTRAEPITHELIAL LESIONS OR MALIGNANCY.   HPVHIGH Negative 07/14/2020 1003   ADEQPAP  07/14/2020 1003    Satisfactory for evaluation; transformation zone component PRESENT.   ADEQPAP  12/27/2017 0000    Satisfactory for evaluation  endocervical/transformation zone component PRESENT.   PCP is Dr Sherwood Gambler.   Review of Systems +missed period +LLQ pain that  goes and comes for 1-2 weeks Has gained weight and had hair loss +easily teary Reviewed past medical,surgical, social and family history. Reviewed medications and allergies.     Objective:   Physical Exam BP 136/88 (BP Location: Left Arm, Patient Position: Sitting, Cuff Size: Normal)   Pulse 73   Ht 5\' 2"  (1.575 m)   Wt 216 lb (98 kg)   LMP 10/28/2022   BMI 39.51 kg/m  UPT is negative. Skin warm and dry.Pelvic: external genitalia is normal in appearance no lesions, vagina: white discharge without odor,urethra has no lesions or masses noted, cervix: bulbous, uterus: normal size, shape and contour, non tender, no masses felt, adnexa: no masses, LLQ tenderness noted. Bladder is non tender and no masses felt.  Fall risk is low    12/07/2022   12:26 PM 12/27/2017   11:05 AM 11/17/2017   11:51 AM  Depression screen PHQ 2/9  Decreased Interest 1 1 1   Down, Depressed, Hopeless 2 1 2   PHQ - 2 Score 3 2 3   Altered sleeping 0 2 1  Tired, decreased energy 1 1 3   Change in appetite 1 0 1  Feeling bad or failure about yourself  2 0 0   Trouble concentrating 0 3 0  Moving slowly or fidgety/restless 0 0 0  Suicidal thoughts 0 0 0  PHQ-9 Score 7 8 8   Difficult doing work/chores  Not difficult at all Somewhat difficult       12/07/2022   12:28 PM  GAD 7 : Generalized Anxiety Score  Nervous, Anxious, on Edge 2  Control/stop worrying 2  Worry too much - different things 2  Trouble relaxing 2  Restless 0  Easily annoyed or irritable 2  Afraid - awful might happen 1  Total GAD 7 Score 11      Upstream - 12/07/22 1210       Pregnancy Intention Screening   Does the patient want to become pregnant in the next year? No    Does the patient's partner want to become pregnant in the next year? No    Would the patient like to discuss contraceptive options today? No      Contraception Wrap Up   Current Method Female Condom    End Method Female Condom               Examination chaperoned by Malachy Mood LPN Assessment:     1. Pregnancy examination or test, negative result - POCT urine pregnancy  2. Missed period Missed period in October UPT  negative  Will watch for now, not unusual with thyroid issues   3. LLQ pain LLQ pain that goes and comes for 1-2 weeks Will get [place] 12/24/22 in office to assess uterus and ovaries   - US PELVIC COMPLETE WITH TRANSVAGINAL; Future  4. Anxiety and depression Easily teary Will rx lexapro 10 mg 1 daily Meds ordered this encounter  Medications   escitalopram (LEXAPRO) 10 MG tablet    Sig: Take 1 tablet (10 mg total) by mouth daily.    Dispense:  30 tablet    Refill:  2    Order Specific Question:   Supervising Provider    Answer:   Duane Lope H [[id number]]      Follow up in 6 weeks in ROS for starting Lexapro Plan:     Return about 12/24/22 when off for pelvic US in office Return in about 6  weeks for follow up on starting lexapro

## 2022-12-20 ENCOUNTER — Ambulatory Visit: Payer: Medicaid Other

## 2022-12-20 DIAGNOSIS — R1032 Left lower quadrant pain: Secondary | ICD-10-CM

## 2022-12-20 NOTE — Progress Notes (Signed)
PELVIC US TA/TV: homogeneous anteverted uterus,WNL,EEC 11 mm,normal ovaries,ovaries appear mobile,avascular fluid filled tube like structure left adnexa 6.1 x 1.9 x 2.3 cm (hydrosalpinx),trace of simple cul de sac fluid,no pain during ultrasound  Chaperone Peggy

## 2023-01-04 LAB — T4, FREE: Free T4: 1.44 ng/dL (ref 0.82–1.77)

## 2023-01-04 LAB — TSH: TSH: 3.97 u[IU]/mL (ref 0.450–4.500)

## 2023-01-05 ENCOUNTER — Telehealth: Payer: Self-pay | Admitting: Nurse Practitioner

## 2023-01-05 DIAGNOSIS — E063 Autoimmune thyroiditis: Secondary | ICD-10-CM

## 2023-01-05 NOTE — Telephone Encounter (Signed)
Labs were normal suggestive that she is on the proper dose of thyroid hormone.  She does not need follow up just yet.  Lets plan to repeat labs in 3 months and have her follow up in office then.  I will put in some orders.

## 2023-01-05 NOTE — Telephone Encounter (Signed)
Yes dosage is staying the same for now.

## 2023-01-05 NOTE — Telephone Encounter (Signed)
Received labs on pt, does she need f/u?

## 2023-01-18 ENCOUNTER — Ambulatory Visit: Payer: Medicaid Other | Admitting: Adult Health

## 2023-01-31 ENCOUNTER — Other Ambulatory Visit: Payer: Self-pay | Admitting: Nurse Practitioner

## 2023-01-31 ENCOUNTER — Other Ambulatory Visit: Payer: Self-pay

## 2023-01-31 DIAGNOSIS — E063 Autoimmune thyroiditis: Secondary | ICD-10-CM

## 2023-01-31 MED ORDER — LEVOTHYROXINE SODIUM 88 MCG PO TABS
88.0000 ug | ORAL_TABLET | Freq: Every day | ORAL | 0 refills | Status: DC
Start: 2023-01-31 — End: 2023-02-03

## 2023-01-31 MED ORDER — LEVOTHYROXINE SODIUM 88 MCG PO TABS: 88.0000 ug | ORAL_TABLET | Freq: Every day | ORAL | 0 refills | Status: DC

## 2023-02-03 ENCOUNTER — Other Ambulatory Visit: Payer: Self-pay

## 2023-02-03 DIAGNOSIS — E063 Autoimmune thyroiditis: Secondary | ICD-10-CM

## 2023-02-03 MED ORDER — LEVOTHYROXINE SODIUM 88 MCG PO TABS
88.0000 ug | ORAL_TABLET | Freq: Every day | ORAL | 0 refills | Status: DC
Start: 2023-02-03 — End: 2023-02-23

## 2023-02-21 ENCOUNTER — Encounter: Payer: Self-pay | Admitting: Nurse Practitioner

## 2023-02-21 DIAGNOSIS — E063 Autoimmune thyroiditis: Secondary | ICD-10-CM

## 2023-02-23 ENCOUNTER — Other Ambulatory Visit: Payer: Self-pay | Admitting: Nurse Practitioner

## 2023-02-23 DIAGNOSIS — E063 Autoimmune thyroiditis: Secondary | ICD-10-CM

## 2023-02-23 LAB — T4, FREE: Free T4: 1.34 ng/dL (ref 0.82–1.77)

## 2023-02-23 LAB — TSH: TSH: 4.42 u[IU]/mL (ref 0.450–4.500)

## 2023-02-23 MED ORDER — LEVOTHYROXINE SODIUM 100 MCG PO TABS: 100.0000 ug | ORAL_TABLET | Freq: Every day | ORAL | 0 refills | Status: DC

## 2023-02-23 NOTE — Addendum Note (Signed)
Addended by: Dani Gobble on: 02/23/2023 07:37 AM   Modules accepted: Orders

## 2023-02-27 ENCOUNTER — Encounter: Payer: Self-pay | Admitting: Internal Medicine

## 2023-02-28 ENCOUNTER — Telehealth: Payer: Self-pay | Admitting: Internal Medicine

## 2023-02-28 DIAGNOSIS — R002 Palpitations: Secondary | ICD-10-CM

## 2023-02-28 NOTE — Telephone Encounter (Signed)
Patient is calling in regard to her patient message sent 02/27/23. Patient is requesting for Dr. Tenny Craw to order a heart monitor for the patient to wear due to having PVC's on a recent EKG.

## 2023-02-28 NOTE — Telephone Encounter (Signed)
Sent message to Dr.Ross,await response

## 2023-03-02 ENCOUNTER — Other Ambulatory Visit: Payer: Medicaid Other

## 2023-03-02 DIAGNOSIS — R002 Palpitations: Secondary | ICD-10-CM

## 2023-03-02 NOTE — Telephone Encounter (Signed)
Pricilla Riffle, MD to Me     02/28/23  9:22 PM Set up for monitor 2 wk      Monitor ordered,will be mailed to pt

## 2023-03-05 DIAGNOSIS — R002 Palpitations: Secondary | ICD-10-CM

## 2023-04-04 ENCOUNTER — Ambulatory Visit
Admission: EM | Admit: 2023-04-04 | Discharge: 2023-04-04 | Disposition: A | Discharge status: Home / Self Care | Attending: Nurse Practitioner | Admitting: Nurse Practitioner

## 2023-04-04 DIAGNOSIS — J029 Acute pharyngitis, unspecified: Secondary | ICD-10-CM | POA: Diagnosis not present

## 2023-04-04 LAB — POC COVID19/FLU A&B COMBO
Covid Antigen, POC: NEGATIVE
Influenza A Antigen, POC: NEGATIVE
Influenza B Antigen, POC: NEGATIVE

## 2023-04-04 LAB — POCT RAPID STREP A (OFFICE): Rapid Strep A Screen: NEGATIVE

## 2023-04-04 MED ORDER — AZITHROMYCIN 500 MG PO TABS: 500.0000 mg | ORAL_TABLET | Freq: Every day | ORAL | 0 refills | Status: AC

## 2023-04-04 MED ORDER — LIDOCAINE VISCOUS HCL 2 % MT SOLN: 15.0000 mL | OROMUCOSAL | 0 refills | Status: DC | PRN

## 2023-04-04 NOTE — ED Provider Notes (Signed)
 [facility]    CSN: [id number] Arrival date & time: 04/04/23  8657      History   Chief Complaint Chief Complaint  Patient presents with   Sore Throat   Cough    HPI Joanna [name] is a 38 y.o. female.   Patient presents today with 2-day history of sore throat, feels like glass shards when she swallows, hoarseness, decreased appetite, and mucus in the back of her throat that she occasionally feels like she has to cough/clear her throat to remove.  She denies fever, body aches or chills, shortness of breath or chest pain, runny or stuffy nose, headache, abdominal pain, nausea/vomiting, diarrhea.  Reports her daughter had influenza last week.  No known recent sick contacts with strep throat.  She took Tylenol for the sore throat which did not really help.  Patient reports history of strep throat approximately 1 year ago.  Also reports history of tonsillectomy when she was 38 years old.    Past Medical History:  Diagnosis Date   Hashimoto thyroiditis    Meningitis spinal    38 years old   Normal delivery 02/14/2012   and 2020   Pericardial cyst    Pregnancy induced hypertension    Thoracic cyst     Patient Active Problem List   Diagnosis Date Noted   Anxiety and depression 12/07/2022   LLQ pain 12/07/2022   Missed period 12/07/2022   Pregnancy examination or test, negative result 12/07/2022   Gastroesophageal reflux disease 07/02/2021   LUQ pain 07/02/2021   PCB (post coital bleeding) 11/21/2019   Hydrosalpinx 09/19/2018   Encounter for IUD insertion 08/21/2018   H/O preeclampsia and GHTN 06/22/2018   Smoker 12/27/2017   Right ovarian cyst 11/17/2017   Depression 11/17/2017   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   Acute nonintractable headache 09/24/2015    Past Surgical History:  Procedure Laterality Date   BIOPSY  08/19/2021   Procedure: BIOPSY;  Surgeon: Dolores Frame, MD;  Location: [facility];  Service: Gastroenterology;;    ESOPHAGOGASTRODUODENOSCOPY (EGD) WITH PROPOFOL N/A 08/19/2021   Procedure: ESOPHAGOGASTRODUODENOSCOPY (EGD) WITH PROPOFOL;  Surgeon: Dolores Frame, MD;  Location: [facility];  Service: Gastroenterology;  Laterality: N/A;  1135   TONSILLECTOMY AND ADENOIDECTOMY     WISDOM TOOTH EXTRACTION     2022    OB History     Gravida  3   Para  2   Term  2   Preterm  0   AB  1   Living  2      SAB  1   IAB  0   Ectopic  0   Multiple  0   Live Births  2            Home Medications    Prior to Admission medications   Medication Sig Start Date End Date Taking? Authorizing Provider  azithromycin (ZITHROMAX) 500 MG tablet Take 1 tablet (500 mg total) by mouth daily for 5 days. 04/04/23 04/09/23 Yes Valentino Nose, NP  levothyroxine (SYNTHROID) 100 MCG tablet Take 1 tablet (100 mcg total) by mouth daily before breakfast. 02/23/23  Yes Reardon, Freddi Starr, NP  lidocaine (XYLOCAINE) 2 % solution Use as directed 15 mLs in the mouth or throat every 3 (three) hours as needed for mouth pain. Gargle and spit as needed for throat pain 04/04/23  Yes Valentino Nose, NP  Probiotic Product (PROBIOTIC PO) Take by mouth.   Yes [provider]  albuterol (VENTOLIN HFA) 108 (90 Base) MCG/ACT inhaler Inhale 2 puffs into the lungs every 4 (four) hours as needed for wheezing or shortness of breath. 11/18/21   Particia Nearing, PA-C  Artificial Tear Ointment (DRY EYES OP) Place 1 drop into both eyes daily as needed (Dry eyes).    [provider]  escitalopram (LEXAPRO) 10 MG tablet Take 1 tablet (10 mg total) by mouth daily. 12/07/22 12/07/23  Adline Potter, NP  ibuprofen (ADVIL) 200 MG tablet Take 200 mg by mouth every 6 (six) hours as needed.    [provider]    Family History Family History  Problem Relation Age of Onset   Hepatitis Mother    Diabetes Mother        borderline   Heart disease Mother    Depression Mother    Hyperlipidemia  Father    AAA (abdominal aortic aneurysm) Paternal Grandfather    Graves' disease Maternal Aunt     Social History Social History   Tobacco Use   Smoking status: Former    Current packs/day: 0.00    Average packs/day: 0.3 packs/day for 10.0 years (2.5 ttl pk-yrs)    Types: Cigarettes    Start date: 02/19/2008    Quit date: 02/18/2018    Years since quitting: 5.1   Smokeless tobacco: Never   Tobacco comments:    2-3 cigarettes daily, pt trying to quit  Vaping Use   Vaping status: Never Used  Substance Use Topics   Alcohol use: Never   Drug use: No     Allergies   Amoxicillin and Latex   Review of Systems Review of Systems Per HPI  Physical Exam Triage Vital Signs ED Triage Vitals  Encounter Vitals Group     BP 04/04/23 0932 (!) 140/84     Systolic BP Percentile --      Diastolic BP Percentile --      Pulse Rate 04/04/23 0932 72     Resp 04/04/23 0932 16     Temp 04/04/23 0932 97.9 F (36.6 C)     Temp Source 04/04/23 0932 Oral     SpO2 04/04/23 0932 97 %     Weight --      Height --      Head Circumference --      Peak Flow --      Pain Score 04/04/23 0933 6     Pain Loc --      Pain Education --      Exclude from Growth Chart --    No data found.  Updated Vital Signs BP (!) 140/84 (BP Location: Right Arm)   Pulse 72   Temp 97.9 F (36.6 C) (Oral)   Resp 16   LMP 03/19/2023 (Approximate)   SpO2 97%   Visual Acuity Right Eye Distance:   Left Eye Distance:   Bilateral Distance:    Right Eye Near:   Left Eye Near:    Bilateral Near:     Physical Exam Vitals and nursing note reviewed.  Constitutional:      General: She is not in acute distress.    Appearance: Normal appearance. She is not ill-appearing or toxic-appearing.  HENT:     Head: Normocephalic and atraumatic.     Right Ear: Tympanic membrane, ear canal and external ear normal. No drainage, swelling or tenderness. No middle ear effusion. Tympanic membrane is not erythematous.      Left Ear: Tympanic membrane, ear canal and external ear normal.  No drainage, swelling or tenderness.  No middle ear effusion. Tympanic membrane is not erythematous.     Nose: No congestion or rhinorrhea.     Mouth/Throat:     Mouth: Mucous membranes are moist.     Pharynx: Oropharynx is clear. No oropharyngeal exudate or posterior oropharyngeal erythema.  Eyes:     General: No scleral icterus.    Extraocular Movements: Extraocular movements intact.  Cardiovascular:     Rate and Rhythm: Normal rate and regular rhythm.  Pulmonary:     Effort: Pulmonary effort is normal. No respiratory distress.     Breath sounds: Normal breath sounds. No wheezing, rhonchi or rales.  Musculoskeletal:     Cervical back: Normal range of motion and neck supple.  Lymphadenopathy:     Cervical: No cervical adenopathy.  Skin:    General: Skin is warm and dry.     Coloration: Skin is not jaundiced or pale.     Findings: No erythema or rash.  Neurological:     Mental Status: She is alert and oriented to person, place, and time.  Psychiatric:        Behavior: Behavior is cooperative.      UC Treatments / Results  Labs (all labs ordered are listed, but only abnormal results are displayed) Labs Reviewed  CULTURE, GROUP A STREP ([facility])  POC COVID19/FLU A&B COMBO  POCT RAPID STREP A (OFFICE)    EKG   Radiology No results found.  Procedures Procedures (including critical care time)  Medications Ordered in UC Medications - No data to display  Initial Impression / Assessment and Plan / UC Course  I have reviewed the triage vital signs and the nursing notes.  Pertinent labs & imaging results that were available during my care of the patient were reviewed by me and considered in my medical decision making (see chart for details).   Patient is well-appearing, normotensive, afebrile, not tachycardic, not tachypneic, oxygenating well on room air.    1. Acute pharyngitis, unspecified etiology Rapid  strep negative, throat culture pending COVID-19, influenza testing negative today Given history of strep throat, absence of cough Patient allergic to penicillins, thereforewill treat patient empirically for strep throat with azithromycin 500 mg daily for 5 days Supportive care discussed including lidocaine rinses Change toothbrush after starting treatment Work excuse provided  The patient was given the opportunity to ask questions.  All questions answered to their satisfaction.  The patient is in agreement to this plan.    Final Clinical Impressions(s) / UC Diagnoses   Final diagnoses:  Acute pharyngitis, unspecified etiology     Discharge Instructions      We are treating you for strep throat today with azithromycin.  Take the antibiotic as prescribed to treat strep throat.   You can use the lidocaine rinses as needed to help with the pain in your throat.  Change your toothbrush today or tomorrow to prevent reinfection.  If symptoms recur within the next year, recommend follow up with ENT.      ED Prescriptions     Medication Sig Dispense Auth. Provider   azithromycin (ZITHROMAX) 500 MG tablet Take 1 tablet (500 mg total) by mouth daily for 5 days. 5 tablet Cathlean Marseilles A, NP   lidocaine (XYLOCAINE) 2 % solution Use as directed 15 mLs in the mouth or throat every 3 (three) hours as needed for mouth pain. Gargle and spit as needed for throat pain 100 mL Valentino Nose, NP  PDMP not reviewed this encounter.   Valentino Nose, NP 04/04/23 [phone]

## 2023-04-04 NOTE — ED Triage Notes (Signed)
 Sore throat, cough x 1 day. Taking tylenol.

## 2023-04-04 NOTE — Discharge Instructions (Signed)
 We are treating you for strep throat today with azithromycin.  Take the antibiotic as prescribed to treat strep throat.   You can use the lidocaine rinses as needed to help with the pain in your throat.  Change your toothbrush today or tomorrow to prevent reinfection.  If symptoms recur within the next year, recommend follow up with ENT.

## 2023-04-07 ENCOUNTER — Encounter: Payer: Self-pay | Admitting: Nurse Practitioner

## 2023-04-07 ENCOUNTER — Ambulatory Visit: Payer: Medicaid Other | Admitting: Nurse Practitioner

## 2023-04-07 ENCOUNTER — Telehealth: Payer: Self-pay

## 2023-04-07 ENCOUNTER — Other Ambulatory Visit (HOSPITAL_COMMUNITY): Payer: Self-pay

## 2023-04-07 VITALS — BP 118/72 | HR 64 | Ht 62.0 in | Wt 220.4 lb

## 2023-04-07 DIAGNOSIS — Z6841 Body Mass Index (BMI) 40.0 and over, adult: Secondary | ICD-10-CM | POA: Diagnosis not present

## 2023-04-07 DIAGNOSIS — E063 Autoimmune thyroiditis: Secondary | ICD-10-CM | POA: Diagnosis not present

## 2023-04-07 DIAGNOSIS — E66813 Obesity, class 3: Secondary | ICD-10-CM

## 2023-04-07 LAB — CULTURE, GROUP A STREP (THRC)

## 2023-04-07 LAB — T4, FREE: Free T4: 1.39 ng/dL (ref 0.82–1.77)

## 2023-04-07 LAB — TSH: TSH: 2.76 u[IU]/mL (ref 0.450–4.500)

## 2023-04-07 MED ORDER — ZEPBOUND 2.5 MG/0.5ML ~~LOC~~ SOAJ: 2.5000 mg | SUBCUTANEOUS | 0 refills | Status: DC

## 2023-04-07 MED ORDER — ZEPBOUND 5 MG/0.5ML ~~LOC~~ SOAJ: 5.0000 mg | SUBCUTANEOUS | 0 refills | Status: DC

## 2023-04-07 MED ORDER — LEVOTHYROXINE SODIUM 100 MCG PO TABS: 100.0000 ug | ORAL_TABLET | Freq: Every day | ORAL | 0 refills | Status: DC

## 2023-04-07 NOTE — Progress Notes (Signed)
 Endocrinology Follow Up Note                                         04/07/2023, 10:24 AM  Subjective:   Subjective    [name] is a 38 y.o.-year-old female patient being seen in follow up after being seen in consultation for hypothyroidism referred by Park Meo, FNP.   Past Medical History:  Diagnosis Date   Hashimoto thyroiditis    Meningitis spinal    38 years old   Normal delivery 02/14/2012   and 2020   Pericardial cyst    Pregnancy induced hypertension    Thoracic cyst     Past Surgical History:  Procedure Laterality Date   BIOPSY  08/19/2021   Procedure: BIOPSY;  Surgeon: Dolores Frame, MD;  Location: [facility];  Service: Gastroenterology;;   ESOPHAGOGASTRODUODENOSCOPY (EGD) WITH PROPOFOL N/A 08/19/2021   Procedure: ESOPHAGOGASTRODUODENOSCOPY (EGD) WITH PROPOFOL;  Surgeon: Dolores Frame, MD;  Location: [facility];  Service: Gastroenterology;  Laterality: N/A;  1135   TONSILLECTOMY AND ADENOIDECTOMY     WISDOM TOOTH EXTRACTION     2022    Social History   Socioeconomic History   Marital status: Significant Other    Spouse name: Not on file   Number of children: 2   Years of education: 20   Highest education level: Some college, no degree  Occupational History   Not on file  Tobacco Use   Smoking status: Former    Current packs/day: 0.00    Average packs/day: 0.3 packs/day for 10.0 years (2.5 ttl pk-yrs)    Types: Cigarettes    Start date: 02/19/2008    Quit date: 02/18/2018    Years since quitting: 5.1   Smokeless tobacco: Never   Tobacco comments:    2-3 cigarettes daily, pt trying to quit  Vaping Use   Vaping status: Never Used  Substance and Sexual Activity   Alcohol use: Never   Drug use: No   Sexual activity: Yes    Birth control/protection: None, Condom  Other Topics Concern   Not on file  Social History Narrative   Not on file    Social Drivers of Health   Financial Resource Strain: Low Risk  (12/27/2017)   Overall Financial Resource Strain (CARDIA)    Difficulty of Paying Living Expenses: Not very hard  Food Insecurity: No Food Insecurity (12/27/2017)   Hunger Vital Sign    Worried About Running Out of Food in the Last Year: Never true    Ran Out of Food in the Last Year: Never true  Transportation Needs: No Transportation Needs (12/27/2017)   PRAPARE - Administrator, Civil Service (Medical): No    Lack of Transportation (Non-Medical): No  Physical Activity: Insufficiently Active (12/27/2017)   Exercise Vital Sign    Days of Exercise per Week: 3 days    Minutes of Exercise per Session: 30 min  Stress: Stress Concern Present (12/27/2017)   [name]  of Occupational Health - Occupational Stress Questionnaire    Feeling of Stress : To some extent  Social Connections: Unknown (03/24/2022)   Received from [facility], [facility]   Social Network    Social Network: Not on file    Family History  Problem Relation Age of Onset   Hepatitis Mother    Diabetes Mother        borderline   Heart disease Mother    Depression Mother    Hyperlipidemia Father    AAA (abdominal aortic aneurysm) Paternal [name] Maternal Aunt     Outpatient Encounter Medications as of 04/07/2023  Medication Sig   escitalopram (LEXAPRO) 10 MG tablet Take 1 tablet (10 mg total) by mouth daily.   ibuprofen (ADVIL) 200 MG tablet Take 200 mg by mouth every 6 (six) hours as needed.   Probiotic Product (PROBIOTIC PO) Take by mouth.   tirzepatide (ZEPBOUND) 2.5 MG/0.5ML Pen Inject 2.5 mg into the skin once a week.   [START ON 05/05/2023] tirzepatide (ZEPBOUND) 5 MG/0.5ML Pen Inject 5 mg into the skin once a week.   [DISCONTINUED] levothyroxine (SYNTHROID) 100 MCG tablet Take 1 tablet (100 mcg total) by mouth daily before breakfast.   albuterol (VENTOLIN HFA) 108 (90 Base) MCG/ACT inhaler  Inhale 2 puffs into the lungs every 4 (four) hours as needed for wheezing or shortness of breath. (Patient not taking: Reported on 04/07/2023)   Artificial Tear Ointment (DRY EYES OP) Place 1 drop into both eyes daily as needed (Dry eyes). (Patient not taking: Reported on 04/07/2023)   azithromycin (ZITHROMAX) 500 MG tablet Take 1 tablet (500 mg total) by mouth daily for 5 days. (Patient not taking: Reported on 04/07/2023)   levothyroxine (SYNTHROID) 100 MCG tablet Take 1 tablet (100 mcg total) by mouth daily before breakfast.   lidocaine (XYLOCAINE) 2 % solution Use as directed 15 mLs in the mouth or throat every 3 (three) hours as needed for mouth pain. Gargle and spit as needed for throat pain (Patient not taking: Reported on 04/07/2023)   No facility-administered encounter medications on file as of 04/07/2023.    ALLERGIES: Allergies  Allergen Reactions   Amoxicillin Swelling and Other (See Comments)    Tongue/facial swelling, burning sensation Has patient had a PCN reaction causing immediate rash, facial/tongue/throat swelling, SOB or lightheadedness with hypotension: No Has patient had a PCN reaction causing severe rash involving mucus membranes or skin necrosis: No Has patient had a PCN reaction that required hospitalization: No Has patient had a PCN reaction occurring within the last 10 years: No If all of the above answers are "NO", then may proceed with Cephalosporin use.    Latex Rash    rash   VACCINATION STATUS: Immunization History  Administered Date(s) Administered   Influenza-Unspecified 11/01/2017   Tdap 02/15/2012, 04/25/2018     HPI   Joanna [name]  is a patient with the above medical history.  She works in Teacher, music at [employer]. she was diagnosed with hypothyroidism at approximate age of 37 years, which required subsequent initiation of thyroid hormone replacement therapy. she was given various doses of Levothyroxine since then, currently on 100 micrograms. she  reports compliance to this medication:  Taking it daily on empty stomach with water, separated by >30 minutes before breakfast and other medications, and by at least 4 hours from calcium, iron, PPIs, multivitamins .  I reviewed patient's thyroid tests:  Lab Results  Component Value Date   TSH  2.760 04/06/2023   TSH 4.420 02/22/2023   TSH 3.970 01/03/2023   TSH 6.370 (H) 10/28/2022   TSH 7.67 (A) 09/20/2022   TSH 7.29 (A) 08/09/2022   TSH 10.90 (A) 07/07/2022   TSH 3.36 10/03/2020   FREET4 1.39 04/06/2023   FREET4 1.34 02/22/2023   FREET4 1.44 01/03/2023   FREET4 1.36 10/28/2022    Pt denies feeling nodules in neck, hoarseness, dysphagia/odynophagia, SOB with lying down.  she does have family history of thyroid disorders in her aunt (Graves disease) and mom has Lupus and autoimmune hepatitis.  No family history of thyroid cancer.  No history of radiation therapy to head or neck.  No recent use of iodine supplements.  Denies use of Biotin containing supplements.  I reviewed her chart and she also has a history of GERD, depression.   ROS:  Constitutional: + weight gain (30 lbs in last 3-4 months), + fatigue-improved, no subjective hyperthermia, no subjective hypothermia Eyes: no blurry vision, no xerophthalmia ENT: no sore throat, no nodules palpated in throat, no dysphagia/odynophagia, no hoarseness Cardiovascular: no chest pain, no SOB, + intermittent palpitations- following with cardiology, no leg swelling Respiratory: no cough, no SOB Gastrointestinal: no nausea/vomiting/diarrhea, + constipation-improved Musculoskeletal: no muscle/joint aches Skin: no rashes, + hair loss Neurological: no tremors, no numbness, no tingling, no dizziness Psychiatric: + depression-improved, no anxiety   Objective:   Objective     BP 118/72 (BP Location: Left Arm, Patient Position: Sitting, Cuff Size: Large)   Pulse 64   Ht 5\' 2"  (1.575 m)   Wt 220 lb 6.4 oz (100 kg)   LMP 03/19/2023  (Approximate)   BMI 40.31 kg/m  Wt Readings from Last 3 Encounters:  04/07/23 220 lb 6.4 oz (100 kg)  12/07/22 216 lb (98 kg)  10/28/22 219 lb 9.6 oz (99.6 kg)    BP Readings from Last 3 Encounters:  04/07/23 118/72  04/04/23 (!) 140/84  12/07/22 136/88      Physical Exam- Limited  Constitutional:  Body mass index is 40.31 kg/m. , not in acute distress, normal state of mind Eyes:  EOMI, no exophthalmos Musculoskeletal: no gross deformities, strength intact in all four extremities, no gross restriction of joint movements Skin:  no rashes, no hyperemia Neurological: no tremor with outstretched hands   CMP ( most recent) CMP     Component Value Date/Time   NA 137 10/17/2018 0006   NA 138 12/27/2017 1202   K 3.5 10/17/2018 0006   CL 108 10/17/2018 0006   CO2 21 (L) 10/17/2018 0006   GLUCOSE 92 10/17/2018 0006   BUN 10 10/17/2018 0006   BUN 8 12/27/2017 1202   CREATININE 0.73 10/17/2018 0006   CALCIUM 8.7 (L) 10/17/2018 0006   PROT 7.4 10/17/2018 0006   PROT 7.0 12/27/2017 1202   ALBUMIN 4.0 10/17/2018 0006   ALBUMIN 4.1 12/27/2017 1202   AST 19 10/17/2018 0006   ALT 21 10/17/2018 0006   ALKPHOS 79 10/17/2018 0006   BILITOT 0.4 10/17/2018 0006   BILITOT <0.2 12/27/2017 1202   GFRNONAA >60 10/17/2018 0006     Diabetic Labs (most recent): No results found for: "HGBA1C", "MICROALBUR"   Lipid Panel ( most recent) Lipid Panel  No results found for: "CHOL", "TRIG", "HDL", "CHOLHDL", "VLDL", "LDLCALC", "LDLDIRECT", "LABVLDL"     Lab Results  Component Value Date   TSH 2.760 04/06/2023   TSH 4.420 02/22/2023   TSH 3.970 01/03/2023   TSH 6.370 (H) 10/28/2022   TSH 7.67 (A)  09/20/2022   TSH 7.29 (A) 08/09/2022   TSH 10.90 (A) 07/07/2022   TSH 3.36 10/03/2020   FREET4 1.39 04/06/2023   FREET4 1.34 02/22/2023   FREET4 1.44 01/03/2023   FREET4 1.36 10/28/2022    Thyroid US from 11/03/22 CLINICAL DATA:  Hypothyroid.   EXAM: THYROID ULTRASOUND    TECHNIQUE: Ultrasound examination of the thyroid gland and adjacent soft tissues was performed.   COMPARISON:  None Available.   FINDINGS: Parenchymal Echotexture: Mildly heterogenous   Isthmus: 0.3 cm   Right lobe: 5.1 x 1.8 x 1.2 cm   Left lobe: 5.4 x 1.1 x 1.3 cm   _________________________________________________________   Estimated total number of nodules >/= 1 cm: 0   Number of spongiform nodules >/=  2 cm not described below (TR1): 0   Number of mixed cystic and solid nodules >/= 1.5 cm not described below (TR2): 0   _________________________________________________________   Mildly heterogeneous background thyroid gland. The gland remains normal in size. Normal vascularity on color Doppler imaging. Incidental note is made of a small subcentimeter nodule in the thyroid isthmus. This lesion does not meet criteria for biopsy or further imaging follow-up in is considered incidental.   IMPRESSION: Mildly heterogeneous but otherwise unremarkable thyroid gland.   A small subcentimeter nodule is noted incidentally in the isthmus but does not meet criteria for follow-up.   The above is in keeping with the ACR TI-RADS recommendations - J Am Coll Radiol 2017;14:587-595.     Electronically Signed   By: Malachy Moan M.D.   On: 11/04/2022 07:39    Latest Reference Range & Units 08/09/22 00:00 09/20/22 00:00 10/28/22 10:42 01/03/23 08:23 02/22/23 15:20 04/06/23 12:34  TSH 0.450 - 4.500 uIU/mL 7.29 ! (E) 7.67 ! (E) 6.370 (H) 3.970 4.420 2.760  T4,Free(Direct) 0.82 - 1.77 ng/dL   5.40 9.81 1.91 4.78  Thyroperoxidase Ab SerPl-aCnc 0 - 34 IU/mL   13     Thyroglobulin Antibody 0.0 - 0.9 IU/mL   2.0 (H)     !: Data is abnormal (H): Data is abnormally high (E): External lab result   Assessment & Plan:   ASSESSMENT / PLAN:  1. Hypothyroidism-r/t Hashimoto's thyroiditis  Patient with long-standing hypothyroidism, on levothyroxine therapy. On physical exam ,  patient  does not have gross goiter, thyroid nodules, or neck compression symptoms.  Her previsit TFTs are consistent with appropriate hormone replacement.  She is advised to continue her Levothyroxine 100 mcg po daily before breakfast.  She is aware that if she gains or loses significant amount of weight, her dose may need to change of her thyroid medication.  - We discussed about correct intake of levothyroxine, at fasting, with water, separated by at least 30 minutes from breakfast, and separated by more than 4 hours from calcium, iron, multivitamins, acid reflux medications (PPIs). -Patient is made aware of the fact that thyroid hormone replacement is needed for life, dose to be adjusted by periodic monitoring of thyroid function tests.  2. Class 3 Severe obesity with serious comorbidity and BMI of 40-44 in adult She notes her PCP did try sending in [facility] for her but said that she did not meet criteria for coverage.  She would like my assistance with this.  I do feel she could benefit from GIP therapy with Zepbound.  I prescribed Zepbound 2.5 mg SQ weekly x 1 month, then increase to 5 mg SQ weekly thereafter.  She has no personal or family history of MTC, no hx of  pancreatitis.  She has been going to the gym more often now that she has more energy since her thyroid is better regulated.  She continues to work on diet and lifestyle management.  -For most of [place] the best way to lose weight is by diet management. Generally speaking, diet management means consuming less calories intentionally which over time brings about progressive weight loss.  This can be achieved more effectively by restricting carbohydrate consumption to the minimum possible.  So, it is critically important to know your numbers: how much calorie you are consuming and how much calorie you need. More importantly, our carbohydrates sources should be unprocessed or minimally processed complex starch food items.   Sometimes, it is  important to balance nutrition by increasing protein intake (animal or plant source), fruits, and vegetables.  -Sticking to a routine mealtime to eat 3 meals a day and avoiding unnecessary snacks is shown to have a big role in weight control. Under normal circumstances, the only time we lose real weight is when we are hungry, so allow hunger to take place- hunger means no food between meal times, only water.  It is not advisable to starve.   -It is better to avoid simple carbohydrates including: Cakes, Sweet Desserts, Ice Cream, Soda (diet and regular), Sweet Tea, Candies, Chips, Cookies, Store Bought Juices, Alcohol in Excess of  1-2 drinks a day, Artificial Sweeteners, Doughnuts, Coffee Creamers, "Sugar-free" Products, etc, etc.  This is not a complete list...Marland Kitchen.    -Consulting with certified diabetes educators is proven to provide you with the most accurate and current information on diet.  Also, you may be  interested in discussing diet options/exchanges , we can schedule a visit with Norm Salt, RDN, CDE for individualized nutrition education.  -Exercise: If you are able: 30 -60 minutes a day ,4 days a week, or 150 minutes a week.  The longer the better.  Combine stretch, strength, and aerobic activities.  If you were told in the past that you have high risk for cardiovascular diseases, you may seek evaluation by your heart doctor prior to initiating moderate to intense exercise programs.        I spent  33  minutes in the care of the patient today including review of labs from Thyroid Function, CMP, and other relevant labs ; imaging/biopsy records (current and previous including abstractions from other facilities); face-to-face time discussing  her lab results and symptoms, medications doses, her options of short and long term treatment based on the latest standards of care / guidelines;   and documenting the encounter.  [name]  participated in the discussions, expressed  understanding, and voiced agreement with the above plans.  All questions were answered to her satisfaction. she is encouraged to contact clinic should she have any questions or concerns prior to her return visit.   FOLLOW UP PLAN:  Return in about 4 months (around 08/07/2023) for Thyroid follow up, Previsit labs.  Ronny Bacon, [facility] [address] [place], [place] [address] Phone: [phone] Fax: [phone]  04/07/2023, 10:24 AM

## 2023-04-07 NOTE — Telephone Encounter (Signed)
 Please be on the lookout for PA for Zepbound for this patient.  Thanks!

## 2023-04-07 NOTE — Telephone Encounter (Signed)
 Pharmacy Patient Advocate Encounter   Received notification from Patient Advice Request messages that prior authorization for Zepbound is required/requested.   Insurance verification completed.   The patient is insured through [facility] .   Per test claim:  Reginal Lutes is preferred by the insurance.  If suggested medication is appropriate, Please send in a new RX and discontinue this one. If not, please advise as to why it's not appropriate so that we may request a Prior Authorization. Please note, some preferred medications may still require a PA.  If the suggested medications have not been trialed and there are no contraindications to their use, the PA will not be submitted, as it will not be approved.

## 2023-04-08 MED ORDER — WEGOVY 0.25 MG/0.5ML ~~LOC~~ SOAJ
0.2500 mg | SUBCUTANEOUS | 0 refills | Status: DC
Start: 2023-04-08 — End: 2023-05-23

## 2023-04-08 MED ORDER — WEGOVY 0.5 MG/0.5ML ~~LOC~~ SOAJ: 0.5000 mg | SUBCUTANEOUS | 3 refills | Status: DC

## 2023-04-08 NOTE — Addendum Note (Signed)
 Addended by: Dani Gobble on: 04/08/2023 09:34 AM   Modules accepted: Orders

## 2023-04-08 NOTE — Telephone Encounter (Signed)
 Yes we can change to [facility].  I sent it in for the patient already.  I only sent in for the first 2 doses for now, depending on how she tolerates it will depend on how quickly we advance the dose.  Can you let her know we changed it?

## 2023-04-08 NOTE — Telephone Encounter (Signed)
 Patient was called and made aware.

## 2023-04-18 ENCOUNTER — Ambulatory Visit: Payer: Medicaid Other | Admitting: Family Medicine

## 2023-05-23 ENCOUNTER — Encounter: Payer: Self-pay | Admitting: Nurse Practitioner

## 2023-05-23 ENCOUNTER — Other Ambulatory Visit: Payer: Self-pay | Admitting: Nurse Practitioner

## 2023-05-23 MED ORDER — WEGOVY 1 MG/0.5ML ~~LOC~~ SOAJ: 1.0000 mg | SUBCUTANEOUS | 3 refills | Status: DC

## 2023-06-02 ENCOUNTER — Other Ambulatory Visit (HOSPITAL_COMMUNITY): Payer: Self-pay | Admitting: Family Medicine

## 2023-06-02 DIAGNOSIS — I318 Other specified diseases of pericardium: Secondary | ICD-10-CM

## 2023-06-06 ENCOUNTER — Other Ambulatory Visit (HOSPITAL_COMMUNITY): Payer: Self-pay | Admitting: Family Medicine

## 2023-06-06 DIAGNOSIS — N63 Unspecified lump in unspecified breast: Secondary | ICD-10-CM

## 2023-06-07 ENCOUNTER — Ambulatory Visit (HOSPITAL_COMMUNITY)
Admission: RE | Admit: 2023-06-07 | Discharge: 2023-06-07 | Disposition: A | Discharge status: Home / Self Care | Source: Ambulatory Visit | Attending: Family Medicine | Admitting: Family Medicine

## 2023-06-07 ENCOUNTER — Ambulatory Visit (HOSPITAL_COMMUNITY)
Admission: RE | Admit: 2023-06-07 | Discharge: 2023-06-07 | Discharge status: Home / Self Care | Source: Ambulatory Visit | Attending: Family Medicine | Admitting: Family Medicine

## 2023-06-07 DIAGNOSIS — N63 Unspecified lump in unspecified breast: Secondary | ICD-10-CM

## 2023-06-15 ENCOUNTER — Ambulatory Visit (HOSPITAL_COMMUNITY)
Admission: RE | Admit: 2023-06-15 | Discharge: 2023-06-15 | Disposition: A | Discharge status: Home / Self Care | Source: Ambulatory Visit | Attending: Family Medicine | Admitting: Family Medicine

## 2023-06-15 DIAGNOSIS — I318 Other specified diseases of pericardium: Secondary | ICD-10-CM | POA: Diagnosis present

## 2023-06-15 MED ORDER — GADOBUTROL 1 MMOL/ML IV SOLN
10.0000 mL | Freq: Once | INTRAVENOUS | Status: AC | PRN
  Administered 2023-06-15: 10 mL via INTRAVENOUS

## 2023-08-02 LAB — T4, FREE: Free T4: 1.56 ng/dL (ref 0.82–1.77)

## 2023-08-02 LAB — TSH: TSH: 1.29 u[IU]/mL (ref 0.450–4.500)

## 2023-08-04 NOTE — Patient Instructions (Signed)

## 2023-08-08 ENCOUNTER — Ambulatory Visit: Admitting: Nurse Practitioner

## 2023-08-08 ENCOUNTER — Encounter: Payer: Self-pay | Admitting: Nurse Practitioner

## 2023-08-08 VITALS — BP 104/80 | HR 83 | Ht 62.0 in | Wt 202.2 lb

## 2023-08-08 DIAGNOSIS — E063 Autoimmune thyroiditis: Secondary | ICD-10-CM | POA: Diagnosis not present

## 2023-08-08 DIAGNOSIS — E66813 Obesity, class 3: Secondary | ICD-10-CM

## 2023-08-08 DIAGNOSIS — Z6841 Body Mass Index (BMI) 40.0 and over, adult: Secondary | ICD-10-CM | POA: Diagnosis not present

## 2023-08-08 MED ORDER — WEGOVY 1.7 MG/0.75ML ~~LOC~~ SOAJ: 1.7000 mg | SUBCUTANEOUS | 2 refills | Status: DC

## 2023-08-08 MED ORDER — LEVOTHYROXINE SODIUM 100 MCG PO TABS: 100.0000 ug | ORAL_TABLET | Freq: Every day | ORAL | 3 refills | Status: AC

## 2023-08-08 NOTE — Progress Notes (Signed)
 Endocrinology Follow Up Note                                         08/08/2023, 8:41 AM  Subjective:   Subjective    Joanna [name] is a 38 y.o.-year-old female patient being seen in follow up after being seen in consultation for hypothyroidism referred by Joanna Jeoffrey RAMAN, FNP.   Past Medical History:  Diagnosis Date   Hashimoto thyroiditis    Meningitis spinal    38 years old   Normal delivery 02/14/2012   and 2020   Pericardial cyst    Pregnancy induced hypertension    Thoracic cyst     Past Surgical History:  Procedure Laterality Date   BIOPSY  08/19/2021   Procedure: BIOPSY;  Surgeon: Joanna Angelia Sieving, MD;  [facility]: [facility];  Service: Gastroenterology;;   ESOPHAGOGASTRODUODENOSCOPY (EGD) WITH PROPOFOL  N/A 08/19/2021   Procedure: ESOPHAGOGASTRODUODENOSCOPY (EGD) WITH PROPOFOL ;  Surgeon: Joanna Angelia Sieving, MD;  Location: [facility];  Service: Gastroenterology;  Laterality: N/A;  1135   TONSILLECTOMY AND ADENOIDECTOMY     WISDOM TOOTH EXTRACTION     2022    Social History   Socioeconomic History   Marital status: Significant Other    Spouse name: Not on file   Number of children: 2   Years of education: 44   Highest education level: Some college, no degree  Occupational History   Not on file  Tobacco Use   Smoking status: Former    Current packs/day: 0.00    Average packs/day: 0.3 packs/day for 10.0 years (2.5 ttl pk-yrs)    Types: Cigarettes    Start date: 02/19/2008    Quit date: 02/18/2018    Years since quitting: 5.4   Smokeless tobacco: Never   Tobacco comments:    2-3 cigarettes daily, pt trying to quit  Vaping Use   Vaping status: Never Used  Substance and Sexual Activity   Alcohol use: Never   Drug use: No   Sexual activity: Yes    Birth control/protection: None, Condom  Other Topics Concern   Not on file  Social History Narrative   Not on file    Social Drivers of Health   Financial Resource Strain: Low Risk  (12/27/2017)   Overall Financial Resource Strain (CARDIA)    Difficulty of Paying Living Expenses: Not very hard  Food Insecurity: No Food Insecurity (12/27/2017)   Hunger Vital Sign    Worried About Running Out of Food in the Last Year: Never true    Ran Out of Food in the Last Year: Never true  Transportation Needs: No Transportation Needs (12/27/2017)   PRAPARE - Administrator, Civil Service (Medical): No    Lack of Transportation (Non-Medical): No  Physical Activity: Insufficiently Active (12/27/2017)   Exercise Vital Sign    Days of Exercise per Week: 3 days    Minutes of Exercise per Session: 30 min  Stress: Stress Concern Present (12/27/2017)   Joanna [name]  of Occupational Health - Occupational Stress Questionnaire    Feeling of Stress : To some extent  Social Connections: Unknown (03/24/2022)   Received [facility]   Social Network    Social Network: Not on file    Family History  Problem Relation Age of Onset   Hepatitis Mother    Diabetes Mother        borderline   Heart disease Mother    Depression Mother    Hyperlipidemia Father    AAA (abdominal aortic aneurysm) Paternal Apolinar Gavel' disease Maternal Aunt     Outpatient Encounter Medications as of 08/08/2023  Medication Sig   escitalopram  (LEXAPRO ) 10 MG tablet Take 1 tablet (10 mg total) by mouth daily.   ibuprofen  (ADVIL ) 200 MG tablet Take 200 mg by mouth every 6 (six) hours as needed.   Probiotic Product (PROBIOTIC PO) Take by mouth.   Semaglutide -Weight Management (WEGOVY ) 1.7 MG/0.75ML SOAJ Inject 1.7 mg into the skin once a week.   [DISCONTINUED] levothyroxine  (SYNTHROID ) 100 MCG tablet Take 1 tablet (100 mcg total) by mouth daily before breakfast.   [DISCONTINUED] Semaglutide -Weight Management (WEGOVY ) 1 MG/0.5ML SOAJ Inject 1 mg into the skin once a week.   levothyroxine  (SYNTHROID ) 100 MCG tablet Take  1 tablet (100 mcg total) by mouth daily before breakfast.   [DISCONTINUED] albuterol  (VENTOLIN  HFA) 108 (90 Base) MCG/ACT inhaler Inhale 2 puffs into the lungs every 4 (four) hours as needed for wheezing or shortness of breath. (Patient not taking: Reported on 08/08/2023)   [DISCONTINUED] Artificial Tear Ointment (DRY EYES OP) Place 1 drop into both eyes daily as needed (Dry eyes). (Patient not taking: Reported on 08/08/2023)   [DISCONTINUED] lidocaine  (XYLOCAINE ) 2 % solution Use as directed 15 mLs in the mouth or throat every 3 (three) hours as needed for mouth pain. Gargle and spit as needed for throat pain (Patient not taking: Reported on 08/08/2023)   [DISCONTINUED] Semaglutide -Weight Management (WEGOVY ) 0.5 MG/0.5ML SOAJ Inject 0.5 mg into the skin once a week. (Patient not taking: Reported on 08/08/2023)   No facility-administered encounter medications on file as of 08/08/2023.    ALLERGIES: Allergies  Allergen Reactions   Amoxicillin Swelling and Other (See Comments)    Tongue/facial swelling, burning sensation Has patient had a PCN reaction causing immediate rash, facial/tongue/throat swelling, SOB or lightheadedness with hypotension: No Has patient had a PCN reaction causing severe rash involving mucus membranes or skin necrosis: No Has patient had a PCN reaction that required hospitalization: No Has patient had a PCN reaction occurring within the last 10 years: No If all of the above answers are NO, then may proceed with Cephalosporin use.    Latex Rash    rash   VACCINATION STATUS: Immunization History  Administered Date(s) Administered   Influenza-Unspecified 11/01/2017   Tdap 02/15/2012, 04/25/2018     HPI   Joanna [name]  is a patient with the above medical history.  She works in Teacher, music at [employer]. she was diagnosed with hypothyroidism at approximate age of 37 years, which required subsequent initiation of thyroid  hormone replacement therapy. she was given  various doses of Levothyroxine  since then, currently on 100 micrograms. she reports compliance to this medication:  Taking it daily on empty stomach with water, separated by >30 minutes before breakfast and other medications, and by at least 4 hours from calcium , iron, PPIs, multivitamins .  I reviewed patient's thyroid  tests:  Lab Results  Component Value Date   TSH  1.290 08/01/2023   TSH 2.760 04/06/2023   TSH 4.420 02/22/2023   TSH 3.970 01/03/2023   TSH 6.370 (H) 10/28/2022   TSH 7.67 (A) 09/20/2022   TSH 7.29 (A) 08/09/2022   TSH 10.90 (A) 07/07/2022   TSH 3.36 10/03/2020   FREET4 1.56 08/01/2023   FREET4 1.39 04/06/2023   FREET4 1.34 02/22/2023   FREET4 1.44 01/03/2023   FREET4 1.36 10/28/2022    Pt denies feeling nodules in neck, hoarseness, dysphagia/odynophagia, SOB with lying down.  she does have family history of thyroid  disorders in her aunt (Graves disease) and mom has Lupus and autoimmune hepatitis.  No family history of thyroid  cancer.  No history of radiation therapy to head or neck.  No recent use of iodine supplements.  Denies use of Biotin containing supplements.  I reviewed her chart and she also has a history of GERD, depression.   ROS:  Constitutional: + weight loss- On Wegovy , + fatigue-improved, no subjective hyperthermia, no subjective hypothermia Eyes: no blurry vision, no xerophthalmia ENT: no sore throat, no nodules palpated in throat, no dysphagia/odynophagia, no hoarseness Cardiovascular: no chest pain, no SOB, + intermittent palpitations- following with cardiology, no leg swelling Respiratory: no cough, no SOB Gastrointestinal: no nausea/vomiting/diarrhea, + constipation-waxing and waning Musculoskeletal: no muscle/joint aches Skin: no rashes, + hair loss Neurological: no tremors, no numbness, no tingling, no dizziness Psychiatric: + depression-improved, no anxiety   Objective:   Objective     BP 104/80 (BP Location: Right Arm, Patient  Position: Sitting, Cuff Size: Large)   Pulse 83   Ht 5' 2 (1.575 m)   Wt 202 lb 3.2 oz (91.7 kg)   BMI 36.98 kg/m  Wt Readings from Last 3 Encounters:  08/08/23 202 lb 3.2 oz (91.7 kg)  04/07/23 220 lb 6.4 oz (100 kg)  12/07/22 216 lb (98 kg)    BP Readings from Last 3 Encounters:  08/08/23 104/80  04/07/23 118/72  04/04/23 (!) 140/84      Physical Exam- Limited  Constitutional:  Body mass index is 36.98 kg/m. , not in acute distress, normal state of mind Eyes:  EOMI, no exophthalmos Musculoskeletal: no gross deformities, strength intact in all four extremities, no gross restriction of joint movements Skin:  no rashes, no hyperemia Neurological: no tremor with outstretched hands   CMP ( most recent) CMP     Component Value Date/Time   NA 137 10/17/2018 0006   NA 138 12/27/2017 1202   K 3.5 10/17/2018 0006   CL 108 10/17/2018 0006   CO2 21 (L) 10/17/2018 0006   GLUCOSE 92 10/17/2018 0006   BUN 10 10/17/2018 0006   BUN 8 12/27/2017 1202   CREATININE 0.73 10/17/2018 0006   CALCIUM  8.7 (L) 10/17/2018 0006   PROT 7.4 10/17/2018 0006   PROT 7.0 12/27/2017 1202   ALBUMIN 4.0 10/17/2018 0006   ALBUMIN 4.1 12/27/2017 1202   AST 19 10/17/2018 0006   ALT 21 10/17/2018 0006   ALKPHOS 79 10/17/2018 0006   BILITOT 0.4 10/17/2018 0006   BILITOT <0.2 12/27/2017 1202   GFRNONAA >60 10/17/2018 0006     Diabetic Labs (most recent): No results found for: HGBA1C, MICROALBUR   Lipid Panel ( most recent) Lipid Panel  No results found for: CHOL, TRIG, HDL, CHOLHDL, VLDL, LDLCALC, LDLDIRECT, LABVLDL     Lab Results  Component Value Date   TSH 1.290 08/01/2023   TSH 2.760 04/06/2023   TSH 4.420 02/22/2023   TSH 3.970 01/03/2023   TSH  6.370 (H) 10/28/2022   TSH 7.67 (A) 09/20/2022   TSH 7.29 (A) 08/09/2022   TSH 10.90 (A) 07/07/2022   TSH 3.36 10/03/2020   FREET4 1.56 08/01/2023   FREET4 1.39 04/06/2023   FREET4 1.34 02/22/2023   FREET4 1.44  01/03/2023   FREET4 1.36 10/28/2022    Thyroid  US  from 11/03/22 CLINICAL DATA:  Hypothyroid.   EXAM: THYROID  ULTRASOUND   TECHNIQUE: Ultrasound examination of the thyroid  gland and adjacent soft tissues was performed.   COMPARISON:  None Available.   FINDINGS: Parenchymal Echotexture: Mildly heterogenous   Isthmus: 0.3 cm   Right lobe: 5.1 x 1.8 x 1.2 cm   Left lobe: 5.4 x 1.1 x 1.3 cm   _________________________________________________________   Estimated total number of nodules >/= 1 cm: 0   Number of spongiform nodules >/=  2 cm not described below (TR1): 0   Number of mixed cystic and solid nodules >/= 1.5 cm not described below (TR2): 0   _________________________________________________________   Mildly heterogeneous background thyroid  gland. The gland remains normal in size. Normal vascularity on color Doppler imaging. Incidental note is made of a small subcentimeter nodule in the thyroid  isthmus. This lesion does not meet criteria for biopsy or further imaging follow-up in is considered incidental.   IMPRESSION: Mildly heterogeneous but otherwise unremarkable thyroid  gland.   A small subcentimeter nodule is noted incidentally in the isthmus but does not meet criteria for follow-up.   The above is in keeping with the ACR TI-RADS recommendations - J Am Coll Radiol 2017;14:587-595.     Electronically Signed   By: Wilkie Lent M.D.   On: 11/04/2022 07:39    Latest Reference Range & Units 10/28/22 10:42 01/03/23 08:23 02/22/23 15:20 04/06/23 12:34 08/01/23 15:30  TSH 0.450 - 4.500 uIU/mL 6.370 (H) 3.970 4.420 2.760 1.290  T4,Free(Direct) 0.82 - 1.77 ng/dL 8.63 8.55 8.65 8.60 8.43  Thyroperoxidase Ab SerPl-aCnc 0 - 34 IU/mL 13      Thyroglobulin Antibody 0.0 - 0.9 IU/mL 2.0 (H)      (H): Data is abnormally high   Assessment & Plan:   ASSESSMENT / PLAN:  1. Hypothyroidism-r/t Hashimoto's thyroiditis  Patient with long-standing hypothyroidism,  on levothyroxine  therapy. On physical exam , patient  does not have gross goiter, thyroid  nodules, or neck compression symptoms.  Her previsit TFTs are consistent with appropriate hormone replacement.  She is advised to continue her Levothyroxine  100 mcg po daily before breakfast.  She is aware that if she gains or loses significant amount of weight, her dose may need to change of her thyroid  medication.  - We discussed about correct intake of levothyroxine , at fasting, with water, separated by at least 30 minutes from breakfast, and separated by more than 4 hours from calcium , iron, multivitamins, acid reflux medications (PPIs). -Patient is made aware of the fact that thyroid  hormone replacement is needed for life, dose to be adjusted by periodic monitoring of thyroid  function tests.  2. Class 3 Severe obesity with serious comorbidity and BMI of 40-44 in adult She notes her PCP did try sending in Wegovy  for her but said that she did not meet criteria for coverage.  She would like my assistance with this.  I do feel she could benefit from GIP therapy with Zepbound .  However, her insurance did not provide optimal coverage for this.  They did cover Wegovy , so we restarted that, currently she is on the 1 mg dose SQ weekly and is tolerating it well.  She  has lost 18 lbs since last visit and is tolerating the medication well.  Will go ahead and bump her up to the next dose 1.7 mg SQ weekly as she reports her desire to snack is coming back pretty strong.  I did encourage her to take Miralax daily to help prevent constipation.  She has been going to the gym more often now that she has more energy since her thyroid  is better regulated.  She continues to work on diet and lifestyle management.  -For most of us  the best way to lose weight is by diet management. Generally speaking, diet management means consuming less calories intentionally which over time brings about progressive weight loss.  This can be  achieved more effectively by restricting carbohydrate consumption to the minimum possible.  So, it is critically important to know your numbers: how much calorie you are consuming and how much calorie you need. More importantly, our carbohydrates sources should be unprocessed or minimally processed complex starch food items.   Sometimes, it is important to balance nutrition by increasing protein intake (animal or plant source), fruits, and vegetables.  -Sticking to a routine mealtime to eat 3 meals a day and avoiding unnecessary snacks is shown to have a big role in weight control. Under normal circumstances, the only time we lose real weight is when we are hungry, so allow hunger to take place- hunger means no food between meal times, only water.  It is not advisable to starve.   -It is better to avoid simple carbohydrates including: Cakes, Sweet Desserts, Ice Cream, Soda (diet and regular), Sweet Tea, Candies, Chips, Cookies, Store Bought Juices, Alcohol in Excess of  1-2 drinks a day, Artificial Sweeteners, Doughnuts, Coffee Creamers, Sugar-free Products, etc, etc.  This is not a complete list...SABRA.    -Consulting with certified diabetes educators is proven to provide you with the most accurate and current information on diet.  Also, you may be  interested in discussing diet options/exchanges , we can schedule a visit with Penny Crumpton, RDN, CDE for individualized nutrition education.  -Exercise: If you are able: 30 -60 minutes a day ,4 days a week, or 150 minutes a week.  The longer the better.  Combine stretch, strength, and aerobic activities.  If you were told in the past that you have high risk for cardiovascular diseases, you may seek evaluation by your heart doctor prior to initiating moderate to intense exercise programs.       I spent  25  minutes in the care of the patient today including review of labs from Thyroid  Function, CMP, and other relevant labs ; imaging/biopsy records (current  and previous including abstractions from other facilities); face-to-face time discussing  her lab results and symptoms, medications doses, her options of short and long term treatment based on the latest standards of care / guidelines;   and documenting the encounter.  [name]  participated in the discussions, expressed understanding, and voiced agreement with the above plans.  All questions were answered to her satisfaction. she is encouraged to contact clinic should she have any questions or concerns prior to her return visit.   FOLLOW UP PLAN:  Return in about 4 months (around 12/09/2023) for Thyroid  follow up, Previsit labs.  Benton Rio, [facility] [address] [place], [place] [address] Phone: [phone] Fax: [phone]  08/08/2023, 8:41 AM

## 2023-09-08 ENCOUNTER — Encounter: Payer: Self-pay | Admitting: Nurse Practitioner

## 2023-09-23 ENCOUNTER — Encounter: Payer: Self-pay | Admitting: Nurse Practitioner

## 2023-09-23 DIAGNOSIS — R5382 Chronic fatigue, unspecified: Secondary | ICD-10-CM

## 2023-09-23 DIAGNOSIS — E063 Autoimmune thyroiditis: Secondary | ICD-10-CM

## 2023-09-24 LAB — CBC WITH DIFFERENTIAL/PLATELET
Basophils Absolute: 0.1 x10E3/uL (ref 0.0–0.2)
Basos: 1 %
EOS (ABSOLUTE): 0.3 x10E3/uL (ref 0.0–0.4)
Eos: 3 %
Hematocrit: 38.1 % (ref 34.0–46.6)
Hemoglobin: 12.5 g/dL (ref 11.1–15.9)
Immature Grans (Abs): 0 x10E3/uL (ref 0.0–0.1)
Immature Granulocytes: 0 %
Lymphocytes Absolute: 2.2 x10E3/uL (ref 0.7–3.1)
Lymphs: 28 %
MCH: 29 pg (ref 26.6–33.0)
MCHC: 32.8 g/dL (ref 31.5–35.7)
MCV: 88 fL (ref 79–97)
Monocytes Absolute: 0.8 x10E3/uL (ref 0.1–0.9)
Monocytes: 10 %
Neutrophils Absolute: 4.5 x10E3/uL (ref 1.4–7.0)
Neutrophils: 58 %
Platelets: 217 x10E3/uL (ref 150–450)
RBC: 4.31 x10E6/uL (ref 3.77–5.28)
RDW: 12.9 % (ref 11.7–15.4)
WBC: 7.9 x10E3/uL (ref 3.4–10.8)

## 2023-09-24 LAB — TSH: TSH: 3.79 u[IU]/mL (ref 0.450–4.500)

## 2023-09-24 LAB — T4, FREE: Free T4: 1.38 ng/dL (ref 0.82–1.77)

## 2023-09-28 ENCOUNTER — Ambulatory Visit: Admitting: Adult Health

## 2023-09-28 ENCOUNTER — Encounter: Payer: Self-pay | Admitting: Adult Health

## 2023-09-28 ENCOUNTER — Other Ambulatory Visit (HOSPITAL_COMMUNITY)
Admission: RE | Admit: 2023-09-28 | Discharge: 2023-09-28 | Disposition: A | Discharge status: Home / Self Care | Source: Ambulatory Visit | Attending: Adult Health | Admitting: Adult Health

## 2023-09-28 VITALS — BP 130/92 | HR 91 | Ht 62.0 in | Wt 194.5 lb

## 2023-09-28 DIAGNOSIS — N926 Irregular menstruation, unspecified: Secondary | ICD-10-CM

## 2023-09-28 DIAGNOSIS — I10 Essential (primary) hypertension: Secondary | ICD-10-CM | POA: Diagnosis not present

## 2023-09-28 DIAGNOSIS — Z124 Encounter for screening for malignant neoplasm of cervix: Secondary | ICD-10-CM | POA: Insufficient documentation

## 2023-09-28 NOTE — Progress Notes (Signed)
  Subjective:     Patient ID: Joanna [name], female   DOB: 1985/07/27, 38 y.o.   MRN: [id number]  HPI Joanna [name] is a 38 year old white female, with SO, G3P2012 in complaining of irregular periods, is taking Wegovy . She had period July 23 x 5 days then took Wegovy  1.7 mg and started bleeding the next day August 7 x 5 days then had period 8/22 x 5 days and had taken Wegovy  the day before. She has lost about 26 lbs since March. 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 She needs a pap.   Review of Systems + irregular periods, is taking Wegovy . She had period July 23 x 5 days then took Wegovy  1.7 mg and started bleeding the next day August 7 x 5 days then had period 8/22 x 5 days and had taken Wegovy  the day before. She has lost about 26 lbs since March.  Denies any pain Reviewed past medical,surgical, social and family history. Reviewed medications and allergies.     Objective:   Physical Exam BP (!) 130/92 (BP Location: Right Arm, Patient Position: Sitting, Cuff Size: Normal)   Pulse 91   Ht 5' 2 (1.575 m)   Wt 194 lb 8 oz (88.2 kg)   LMP 09/23/2023   BMI 35.57 kg/m     Skin warm and dry.Pelvic: external genitalia is normal in appearance no lesions, vagina: pink,urethra has no lesions or masses noted, cervix:bulbous, everted at os and irregular os, pap with GC/CHL and HR HPV genotyping performed, uterus: normal size, shape and contour, non tender, no masses felt, adnexa: no masses or tenderness noted. Bladder is non tender and no masses felt. Fall risk is low  Upstream - 09/28/23 1359       Pregnancy Intention Screening   Does the patient want to become pregnant in the next year? No    Does the patient's partner want to become pregnant in the next year? No    Would the patient like to discuss contraceptive options today? No      Contraception Wrap Up   Current Method Female Condom    End Method Female Condom         Examination chaperoned by Clarita Salt LPN  Assessment:     1. Routine  Papanicolaou smear Pap sent Pap in 3 years if negative  - Cytology - PAP( [place])  2. Irregular periods (Primary)  -irregular periods, is taking Wegovy . She had period July 23 x 5 days then took Wegovy  1.7 mg and started bleeding the next day August 7 x 5 days then had period 8/22 x 5 days and had taken Wegovy  the day before.  Will keep log of bleeding for now Not sure if related to Wegovy  or not Has had change in thyroid  labs, see LELON Rio NP   3. Hypertension, unspecified type She had PIH Keep check on BP at home and record,watch salt and sugar Review DASH diet Follow up in 6 weeks for BP check     Plan:     Follow up in 6 weeks for ROS on cycles and BP

## 2023-10-05 ENCOUNTER — Other Ambulatory Visit (HOSPITAL_COMMUNITY): Payer: Self-pay

## 2023-10-05 ENCOUNTER — Ambulatory Visit: Payer: Self-pay | Admitting: Adult Health

## 2023-10-05 ENCOUNTER — Telehealth: Payer: Self-pay

## 2023-10-05 LAB — CYTOLOGY - PAP
Chlamydia: NEGATIVE
Comment: NEGATIVE
Comment: NEGATIVE
Comment: NORMAL
Diagnosis: NEGATIVE
Diagnosis: REACTIVE
High risk HPV: NEGATIVE
Neisseria Gonorrhea: NEGATIVE

## 2023-10-05 NOTE — Telephone Encounter (Signed)
 Please see PA request for Wegovy 

## 2023-10-05 NOTE — Telephone Encounter (Signed)
 Pharmacy Patient Advocate Encounter   Received notification from Pt Calls Messages that prior authorization for Wegovy  1.7MG /0.75ML auto-injectors is required/requested.   Insurance verification completed.   The patient is insured through [employer] .   Per test claim: PA required; PA submitted to above mentioned insurance via Latent Key/confirmation #/EOC  [id number] Status is pending

## 2023-10-12 ENCOUNTER — Telehealth: Payer: Self-pay

## 2023-10-12 NOTE — Telephone Encounter (Signed)
 Pharmacy Patient Advocate Encounter  Received notification from [employer] that Prior Authorization for Wegovy  1.7MG /0.75ML auto-injectors has been APPROVED from 10/06/23 to 10/05/24

## 2023-10-12 NOTE — Telephone Encounter (Signed)
 Patient was called and made aware. She has already picked up the Wegovy .

## 2023-10-12 NOTE — Telephone Encounter (Signed)
 Patient called in regards to elevated blood pressures. She was not feeling well; experiencing a headache. She took her blood pressure and it was 162/112 and she checked it again after resting and it was 146/98. Patient previously saw a provider on 09/28/23 and had elevated blood pressure in the office as well. Has been checking Bps at home and recording. RN asked for patient to send a picture of her Bps to have in the chart. Pharmacy confirmed and all questions answered.

## 2023-10-13 ENCOUNTER — Other Ambulatory Visit: Payer: Self-pay | Admitting: Adult Health

## 2023-10-13 MED ORDER — AMLODIPINE BESYLATE 5 MG PO TABS: 5.0000 mg | ORAL_TABLET | Freq: Every day | ORAL | 3 refills | Status: AC

## 2023-10-13 NOTE — Progress Notes (Signed)
 Rx norvasc  5 mg for elevated BP, keep check on BP

## 2023-10-19 ENCOUNTER — Encounter: Payer: Self-pay | Admitting: Nurse Practitioner

## 2023-10-19 DIAGNOSIS — E063 Autoimmune thyroiditis: Secondary | ICD-10-CM

## 2023-10-22 LAB — COMPREHENSIVE METABOLIC PANEL WITH GFR
ALT: 23 IU/L (ref 0–32)
AST: 23 IU/L (ref 0–40)
Albumin: 4.2 g/dL (ref 3.9–4.9)
Alkaline Phosphatase: 69 IU/L (ref 41–116)
BUN/Creatinine Ratio: 14 (ref 9–23)
BUN: 11 mg/dL (ref 6–20)
Bilirubin Total: 0.3 mg/dL (ref 0.0–1.2)
CO2: 17 mmol/L — ABNORMAL LOW (ref 20–29)
Calcium: 9.2 mg/dL (ref 8.7–10.2)
Chloride: 104 mmol/L (ref 96–106)
Creatinine, Ser: 0.79 mg/dL (ref 0.57–1.00)
Globulin, Total: 2.8 g/dL (ref 1.5–4.5)
Glucose: 75 mg/dL (ref 70–99)
Potassium: 4.2 mmol/L (ref 3.5–5.2)
Sodium: 139 mmol/L (ref 134–144)
Total Protein: 7 g/dL (ref 6.0–8.5)
eGFR: 98 mL/min/1.73 (ref >59)

## 2023-10-22 LAB — T4, FREE: Free T4: 1.41 ng/dL (ref 0.82–1.77)

## 2023-10-22 LAB — TSH: TSH: 2.76 u[IU]/mL (ref 0.450–4.500)

## 2023-10-24 ENCOUNTER — Ambulatory Visit: Payer: Self-pay | Admitting: Nurse Practitioner

## 2023-10-24 NOTE — Progress Notes (Signed)
 FYI I sent mychart message going over thyroid  lab results

## 2023-10-24 NOTE — Progress Notes (Signed)
 Noted, that the patient was sent a message in MyChart going over her results.

## 2023-11-10 ENCOUNTER — Ambulatory Visit: Admitting: Adult Health

## 2023-11-28 ENCOUNTER — Other Ambulatory Visit: Payer: Self-pay | Admitting: Family Medicine

## 2023-11-28 DIAGNOSIS — R928 Other abnormal and inconclusive findings on diagnostic imaging of breast: Secondary | ICD-10-CM

## 2023-12-07 ENCOUNTER — Other Ambulatory Visit: Payer: Self-pay | Admitting: Adult Health

## 2023-12-07 ENCOUNTER — Encounter: Payer: Self-pay | Admitting: Family Medicine

## 2023-12-07 ENCOUNTER — Other Ambulatory Visit: Payer: Self-pay | Admitting: Nurse Practitioner

## 2023-12-07 DIAGNOSIS — R928 Other abnormal and inconclusive findings on diagnostic imaging of breast: Secondary | ICD-10-CM

## 2023-12-08 ENCOUNTER — Encounter

## 2023-12-08 ENCOUNTER — Other Ambulatory Visit

## 2023-12-09 ENCOUNTER — Encounter: Payer: Self-pay | Admitting: Nurse Practitioner

## 2023-12-09 ENCOUNTER — Ambulatory Visit: Admitting: Nurse Practitioner

## 2023-12-09 VITALS — BP 108/78 | HR 70 | Ht 62.0 in | Wt 189.6 lb

## 2023-12-09 DIAGNOSIS — E66813 Obesity, class 3: Secondary | ICD-10-CM | POA: Diagnosis not present

## 2023-12-09 DIAGNOSIS — Z6841 Body Mass Index (BMI) 40.0 and over, adult: Secondary | ICD-10-CM

## 2023-12-09 DIAGNOSIS — E063 Autoimmune thyroiditis: Secondary | ICD-10-CM | POA: Diagnosis not present

## 2023-12-09 DIAGNOSIS — R5382 Chronic fatigue, unspecified: Secondary | ICD-10-CM | POA: Diagnosis not present

## 2023-12-09 LAB — TSH: TSH: 2.17 u[IU]/mL (ref 0.450–4.500)

## 2023-12-09 LAB — T4, FREE: Free T4: 1.37 ng/dL (ref 0.82–1.77)

## 2023-12-09 NOTE — Patient Instructions (Signed)

## 2023-12-09 NOTE — Progress Notes (Signed)
 Endocrinology Follow Up Note                                         12/09/2023, 9:13 AM  Subjective:   Subjective    Joanna [name] is a 38 y.o.-year-old female patient being seen in follow up after being seen in consultation for hypothyroidism referred by Kayla Jeoffrey RAMAN, FNP.   Past Medical History:  Diagnosis Date   Hashimoto thyroiditis    Meningitis spinal    38 years old   Normal delivery 02/14/2012   and 2020   Pericardial cyst    Pregnancy induced hypertension    Thoracic cyst     Past Surgical History:  Procedure Laterality Date   BIOPSY  08/19/2021   Procedure: BIOPSY;  Surgeon: Eartha Angelia Sieving, MD;  Location: [facility];  Service: Gastroenterology;;   ESOPHAGOGASTRODUODENOSCOPY (EGD) WITH PROPOFOL  N/A 08/19/2021   Procedure: ESOPHAGOGASTRODUODENOSCOPY (EGD) WITH PROPOFOL ;  Surgeon: Eartha Angelia Sieving, MD;  Location: [facility];  Service: Gastroenterology;  Laterality: N/A;  1135   TONSILLECTOMY AND ADENOIDECTOMY     WISDOM TOOTH EXTRACTION     2022    Social History   Socioeconomic History   Marital status: Significant Other    Spouse name: Not on file   Number of children: 2   Years of education: 30   Highest education level: Some college, no degree  Occupational History   Not on file  Tobacco Use   Smoking status: Former    Current packs/day: 0.00    Average packs/day: 0.3 packs/day for 10.0 years (2.5 ttl pk-yrs)    Types: Cigarettes    Start date: 02/19/2008    Quit date: 02/18/2018    Years since quitting: 5.8   Smokeless tobacco: Never   Tobacco comments:    2-3 cigarettes daily, pt trying to quit  Vaping Use   Vaping status: Never Used  Substance and Sexual Activity   Alcohol use: Never   Drug use: No   Sexual activity: Yes    Birth control/protection: None, Condom  Other Topics Concern   Not on file  Social History Narrative   Not on file    Social Drivers of Health   Financial Resource Strain: Low Risk  (12/27/2017)   Overall Financial Resource Strain (CARDIA)    Difficulty of Paying Living Expenses: Not very hard  Food Insecurity: No Food Insecurity (12/27/2017)   Hunger Vital Sign    Worried About Running Out of Food in the Last Year: Never true    Ran Out of Food in the Last Year: Never true  Transportation Needs: No Transportation Needs (12/27/2017)   PRAPARE - Administrator, Civil Service (Medical): No    Lack of Transportation (Non-Medical): No  Physical Activity: Insufficiently Active (12/27/2017)   Exercise Vital Sign    Days of Exercise per Week: 3 days    Minutes of Exercise per Session: 30 min  Stress: Stress Concern Present (12/27/2017)   [name] Occupational [employer] - Occupational Stress Questionnaire    Feeling of Stress : To some extent  Social Connections: Unknown (03/24/2022)   Received from [facility]   Social Network    Social Network: Not on file    Family History  Problem Relation Age of Onset   Hepatitis Mother    Diabetes Mother        borderline   Heart disease Mother    Depression Mother    Hyperlipidemia Father    AAA (abdominal aortic aneurysm) Paternal Apolinar Gavel' disease Maternal Aunt     Outpatient Encounter Medications as of 12/09/2023  Medication Sig   amLODipine  (NORVASC ) 5 MG tablet Take 1 tablet (5 mg total) by mouth daily.   ibuprofen  (ADVIL ) 200 MG tablet Take 200 mg by mouth every 6 (six) hours as needed.   levothyroxine  (SYNTHROID ) 100 MCG tablet Take 1 tablet (100 mcg total) by mouth daily before breakfast.   Probiotic Product (PROBIOTIC PO) Take by mouth.   [DISCONTINUED] Semaglutide -Weight Management (WEGOVY ) 1.7 MG/0.75ML SOAJ Inject 1.7 mg into the skin once a week. (Patient not taking: Reported on 12/09/2023)   No facility-administered encounter medications on file as of 12/09/2023.    ALLERGIES: Allergies  Allergen  Reactions   Amoxicillin Swelling and Other (See Comments)    Tongue/facial swelling, burning sensation Has patient had a PCN reaction causing immediate rash, facial/tongue/throat swelling, SOB or lightheadedness with hypotension: No Has patient had a PCN reaction causing severe rash involving mucus membranes or skin necrosis: No Has patient had a PCN reaction that required hospitalization: No Has patient had a PCN reaction occurring within the last 10 years: No If all of the above answers are NO, then may proceed with Cephalosporin use.    Latex Rash    rash   VACCINATION STATUS: Immunization History  Administered Date(s) Administered   Influenza-Unspecified 11/01/2017   Tdap 02/15/2012, 04/25/2018     HPI   Joanna [name]  is a patient with the above medical history.  She works in teacher, music at [employer]. she was diagnosed with hypothyroidism at approximate age of 37 years, which required subsequent initiation of thyroid  hormone replacement therapy. she was given various doses of Levothyroxine  since then, currently on 100 micrograms. she reports compliance to this medication:  Taking it daily on empty stomach with water, separated by >30 minutes before breakfast and other medications, and by at least 4 hours from calcium , iron, PPIs, multivitamins .  I reviewed patient's thyroid  tests:  Lab Results  Component Value Date   TSH 2.170 12/08/2023   TSH 2.760 10/21/2023   TSH 3.790 09/23/2023   TSH 1.290 08/01/2023   TSH 2.760 04/06/2023   TSH 4.420 02/22/2023   TSH 3.970 01/03/2023   TSH 6.370 (H) 10/28/2022   TSH 7.67 (A) 09/20/2022   TSH 7.29 (A) 08/09/2022   FREET4 1.37 12/08/2023   FREET4 1.41 10/21/2023   FREET4 1.38 09/23/2023   FREET4 1.56 08/01/2023   FREET4 1.39 04/06/2023   FREET4 1.34 02/22/2023   FREET4 1.44 01/03/2023   FREET4 1.36 10/28/2022    Pt denies feeling nodules in neck, hoarseness, dysphagia/odynophagia, SOB with lying down.  she does  have family history of thyroid  disorders in her aunt (Graves disease) and mom has Lupus and autoimmune hepatitis.  No family history of thyroid  cancer.  No history of radiation therapy to head or neck.  No recent use of iodine supplements.  Denies use of Biotin containing  supplements.  I reviewed her chart and she also has a history of GERD, depression.   Review of systems  Constitutional: + decreasing body weight,  current Body mass index is 34.68 kg/m. , no fatigue, no subjective hyperthermia, no subjective hypothermia Eyes: no blurry vision, no xerophthalmia ENT: no sore throat, no nodules palpated in throat, no dysphagia/odynophagia, no hoarseness Cardiovascular: no chest pain, no shortness of breath, no palpitations, no leg swelling Respiratory: no cough, no shortness of breath Gastrointestinal: no nausea/vomiting/diarrhea Musculoskeletal: no muscle/joint aches Skin: no rashes, no hyperemia Neurological: no tremors, no numbness, no tingling, no dizziness Psychiatric: no depression, no anxiety   Objective:   Objective     BP 108/78 (BP Location: Left Arm, Patient Position: Sitting, Cuff Size: Large)   Pulse 70   Ht 5' 2 (1.575 m)   Wt 189 lb 9.6 oz (86 kg)   BMI 34.68 kg/m  Wt Readings from Last 3 Encounters:  12/09/23 189 lb 9.6 oz (86 kg)  09/28/23 194 lb 8 oz (88.2 kg)  08/08/23 202 lb 3.2 oz (91.7 kg)    BP Readings from Last 3 Encounters:  12/09/23 108/78  09/28/23 (!) 130/92  08/08/23 104/80       Physical Exam- Limited  Constitutional:  Body mass index is 34.68 kg/m. , not in acute distress, normal state of mind Eyes:  EOMI, no exophthalmos Musculoskeletal: no gross deformities, strength intact in all four extremities, no gross restriction of joint movements Skin:  no rashes, no hyperemia Neurological: no tremor with outstretched hands   CMP ( most recent) CMP     Component Value Date/Time   NA 139 10/21/2023 1428   K 4.2 10/21/2023 1428   CL  104 10/21/2023 1428   CO2 17 (L) 10/21/2023 1428   GLUCOSE 75 10/21/2023 1428   GLUCOSE 92 10/17/2018 0006   BUN 11 10/21/2023 1428   CREATININE 0.79 10/21/2023 1428   CALCIUM  9.2 10/21/2023 1428   PROT 7.0 10/21/2023 1428   ALBUMIN 4.2 10/21/2023 1428   AST 23 10/21/2023 1428   ALT 23 10/21/2023 1428   ALKPHOS 69 10/21/2023 1428   BILITOT 0.3 10/21/2023 1428   EGFR 98 10/21/2023 1428   GFRNONAA >60 10/17/2018 0006     Diabetic Labs (most recent): No results found for: HGBA1C, MICROALBUR   Lipid Panel ( most recent) Lipid Panel  No results found for: CHOL, TRIG, HDL, CHOLHDL, VLDL, LDLCALC, LDLDIRECT, LABVLDL     Lab Results  Component Value Date   TSH 2.170 12/08/2023   TSH 2.760 10/21/2023   TSH 3.790 09/23/2023   TSH 1.290 08/01/2023   TSH 2.760 04/06/2023   TSH 4.420 02/22/2023   TSH 3.970 01/03/2023   TSH 6.370 (H) 10/28/2022   TSH 7.67 (A) 09/20/2022   TSH 7.29 (A) 08/09/2022   FREET4 1.37 12/08/2023   FREET4 1.41 10/21/2023   FREET4 1.38 09/23/2023   FREET4 1.56 08/01/2023   FREET4 1.39 04/06/2023   FREET4 1.34 02/22/2023   FREET4 1.44 01/03/2023   FREET4 1.36 10/28/2022    Thyroid  US  from 11/03/22 CLINICAL DATA:  Hypothyroid.   EXAM: THYROID  ULTRASOUND   TECHNIQUE: Ultrasound examination of the thyroid  gland and adjacent soft tissues was performed.   COMPARISON:  None Available.   FINDINGS: Parenchymal Echotexture: Mildly heterogenous   Isthmus: 0.3 cm   Right lobe: 5.1 x 1.8 x 1.2 cm   Left lobe: 5.4 x 1.1 x 1.3 cm   _________________________________________________________   Estimated total number of  nodules >/= 1 cm: 0   Number of spongiform nodules >/=  2 cm not described below (TR1): 0   Number of mixed cystic and solid nodules >/= 1.5 cm not described below (TR2): 0   _________________________________________________________   Mildly heterogeneous background thyroid  gland. The gland remains normal in  size. Normal vascularity on color Doppler imaging. Incidental note is made of a small subcentimeter nodule in the thyroid  isthmus. This lesion does not meet criteria for biopsy or further imaging follow-up in is considered incidental.   IMPRESSION: Mildly heterogeneous but otherwise unremarkable thyroid  gland.   A small subcentimeter nodule is noted incidentally in the isthmus but does not meet criteria for follow-up.   The above is in keeping with the ACR TI-RADS recommendations - J Am Coll Radiol 2017;14:587-595.     Electronically Signed   By: Wilkie Lent M.D.   On: 11/04/2022 07:39    Latest Reference Range & Units 08/01/23 15:30 09/23/23 15:08 10/21/23 14:28 12/08/23 09:52  TSH 0.450 - 4.500 uIU/mL 1.290 3.790 2.760 2.170  T4,Free(Direct) 0.82 - 1.77 ng/dL 8.43 8.61 8.58 8.62     Assessment & Plan:   ASSESSMENT / PLAN:  1. Hypothyroidism-r/t Hashimoto's thyroiditis  Patient with long-standing hypothyroidism, on levothyroxine  therapy. On physical exam , patient  does not have gross goiter, thyroid  nodules, or neck compression symptoms.  Her previsit TFTs are consistent with appropriate hormone replacement.  She is advised to continue her Levothyroxine  100 mcg po daily before breakfast.    - The correct intake of thyroid  hormone (Levothyroxine , Synthroid ), is on empty stomach first thing in the morning, with water, separated by at least 30 minutes from breakfast and other medications,  and separated by more than 4 hours from calcium , iron, multivitamins, acid reflux medications (PPIs).  - This medication is a life-long medication and will be needed to correct thyroid  hormone imbalances for the rest of your life.  The dose may change from time to time, based on thyroid  blood work.  - It is extremely important to be consistent taking this medication, near the same time each morning.  -AVOID TAKING PRODUCTS CONTAINING BIOTIN (commonly found in Hair, Skin, Nails  vitamins) AS IT INTERFERES WITH THE VALIDITY OF THYROID  FUNCTION BLOOD TESTS.  2. Class 3 Severe obesity with serious comorbidity and BMI of 40-44 in adult She notes her PCP did try sending in Wegovy  for her but said that she did not meet criteria for coverage.  She would like my assistance with this.  I do feel she could benefit from GIP therapy with Zepbound .  However, her insurance did not provide optimal coverage for this.  They did cover Wegovy , so we restarted that, currently she was on the 1.7 mg dose SQ weekly.  12/09/23- since last visit, Medicaid has discontinued coverage for weight loss medications including Wegovy  unless she patients have history of CVA.  She is bummed by this decision as it has tremendously helped her manage weight and inflammation in the body.  We did talk about putting her on Phentermine but ultimately decided against it.  She continues to work on diet and lifestyle management, has limited gluten in her diet, trying to consume less inflammation causing foods.  The following Lifestyle Medicine recommendations according to American College of Lifestyle Medicine [facility]) were discussed and offered to patient and she agrees to start the journey:  A. Whole Foods, Plant-based plate comprising of fruits and vegetables, plant-based proteins, whole-grain carbohydrates was discussed in detail with the patient.  A list for source of those nutrients were also provided to the patient.  Patient will use only water or unsweetened tea for hydration. B.  The need to stay away from risky substances including alcohol, smoking; obtaining 7 to 9 hours of restorative sleep, at least 150 minutes of moderate intensity exercise weekly, the importance of healthy social connections,  and stress reduction techniques were discussed. C.  A full color page of  Calorie density of various food groups per pound showing examples of each food groups was provided to the patient.     I spent  12  minutes  in the care of the patient today including review of labs from Thyroid  Function, CMP, and other relevant labs ; imaging/biopsy records (current and previous including abstractions from other facilities); face-to-face time discussing  her lab results and symptoms, medications doses, her options of short and long term treatment based on the latest standards of care / guidelines;   and documenting the encounter.  [name]  participated in the discussions, expressed understanding, and voiced agreement with the above plans.  All questions were answered to her satisfaction. she is encouraged to contact clinic should she have any questions or concerns prior to her return visit.   FOLLOW UP PLAN:  Return in about 4 months (around 04/07/2024) for Thyroid  follow up, Previsit labs.  Benton Rio, [facility] [address] [place], [place] [address] Phone: [phone] Fax: [phone]  12/09/2023, 9:13 AM

## 2023-12-19 ENCOUNTER — Ambulatory Visit

## 2023-12-22 ENCOUNTER — Ambulatory Visit
Admission: RE | Admit: 2023-12-22 | Discharge: 2023-12-22 | Disposition: A | Source: Ambulatory Visit | Attending: Adult Health | Admitting: Adult Health

## 2023-12-22 ENCOUNTER — Other Ambulatory Visit: Payer: Self-pay | Admitting: Adult Health

## 2023-12-22 ENCOUNTER — Ambulatory Visit: Payer: Self-pay | Admitting: Adult Health

## 2023-12-22 DIAGNOSIS — R928 Other abnormal and inconclusive findings on diagnostic imaging of breast: Secondary | ICD-10-CM

## 2023-12-22 DIAGNOSIS — N6489 Other specified disorders of breast: Secondary | ICD-10-CM

## 2024-01-24 ENCOUNTER — Ambulatory Visit: Admitting: Physician Assistant

## 2024-04-13 ENCOUNTER — Ambulatory Visit: Admitting: Nurse Practitioner
